# Patient Record
Sex: Female | Born: 1950 | Race: White | Hispanic: No | Marital: Single | Smoking: Current every day smoker
Health system: Southern US, Community
[De-identification: ages and names within clinical notes are randomized; demographics above are authoritative.]

## PROBLEM LIST (undated history)

## (undated) DIAGNOSIS — Z8719 Personal history of other diseases of the digestive system: Secondary | ICD-10-CM

## (undated) DIAGNOSIS — E785 Hyperlipidemia, unspecified: Secondary | ICD-10-CM

## (undated) DIAGNOSIS — M1990 Unspecified osteoarthritis, unspecified site: Secondary | ICD-10-CM

## (undated) DIAGNOSIS — N200 Calculus of kidney: Secondary | ICD-10-CM

## (undated) HISTORY — DX: Personal history of other diseases of the digestive system: Z87.19

## (undated) HISTORY — DX: Unspecified osteoarthritis, unspecified site: M19.90

## (undated) HISTORY — DX: Hyperlipidemia, unspecified: E78.5

## (undated) HISTORY — DX: Calculus of kidney: N20.0

## (undated) HISTORY — PX: BREAST CYST EXCISION: SHX579

## (undated) HISTORY — PX: VAGINAL HYSTERECTOMY: SUR661

---

## 1998-07-18 ENCOUNTER — Other Ambulatory Visit: Admission: RE | Admit: 1998-07-18 | Discharge: 1998-07-18 | Payer: Self-pay | Admitting: Obstetrics and Gynecology

## 1999-04-29 ENCOUNTER — Encounter: Payer: Self-pay | Admitting: Obstetrics and Gynecology

## 1999-04-29 ENCOUNTER — Encounter: Admission: RE | Admit: 1999-04-29 | Discharge: 1999-04-29 | Payer: Self-pay | Admitting: Obstetrics and Gynecology

## 1999-06-04 ENCOUNTER — Other Ambulatory Visit: Admission: RE | Admit: 1999-06-04 | Discharge: 1999-06-04 | Payer: Self-pay | Admitting: Obstetrics and Gynecology

## 1999-06-04 ENCOUNTER — Encounter (INDEPENDENT_AMBULATORY_CARE_PROVIDER_SITE_OTHER): Payer: Self-pay | Admitting: Specialist

## 1999-08-22 ENCOUNTER — Encounter: Payer: Self-pay | Admitting: Obstetrics and Gynecology

## 1999-08-26 ENCOUNTER — Inpatient Hospital Stay (HOSPITAL_COMMUNITY): Admission: RE | Admit: 1999-08-26 | Discharge: 1999-08-28 | Payer: Self-pay | Admitting: Obstetrics and Gynecology

## 2000-08-31 ENCOUNTER — Encounter: Payer: Self-pay | Admitting: Obstetrics and Gynecology

## 2000-08-31 ENCOUNTER — Encounter: Admission: RE | Admit: 2000-08-31 | Discharge: 2000-08-31 | Payer: Self-pay | Admitting: Obstetrics and Gynecology

## 2000-10-08 ENCOUNTER — Other Ambulatory Visit: Admission: RE | Admit: 2000-10-08 | Discharge: 2000-10-08 | Payer: Self-pay | Admitting: Obstetrics and Gynecology

## 2001-10-08 ENCOUNTER — Encounter: Payer: Self-pay | Admitting: Obstetrics and Gynecology

## 2001-10-08 ENCOUNTER — Encounter: Admission: RE | Admit: 2001-10-08 | Discharge: 2001-10-08 | Payer: Self-pay | Admitting: Obstetrics and Gynecology

## 2001-10-15 ENCOUNTER — Other Ambulatory Visit: Admission: RE | Admit: 2001-10-15 | Discharge: 2001-10-15 | Payer: Self-pay | Admitting: Obstetrics and Gynecology

## 2002-03-27 ENCOUNTER — Emergency Department (HOSPITAL_COMMUNITY): Admission: EM | Admit: 2002-03-27 | Discharge: 2002-03-27 | Payer: Self-pay | Admitting: Emergency Medicine

## 2002-10-17 ENCOUNTER — Encounter: Payer: Self-pay | Admitting: Obstetrics and Gynecology

## 2002-10-17 ENCOUNTER — Encounter: Admission: RE | Admit: 2002-10-17 | Discharge: 2002-10-17 | Payer: Self-pay | Admitting: Obstetrics and Gynecology

## 2002-10-31 ENCOUNTER — Other Ambulatory Visit: Admission: RE | Admit: 2002-10-31 | Discharge: 2002-10-31 | Payer: Self-pay | Admitting: Obstetrics and Gynecology

## 2003-09-17 ENCOUNTER — Encounter: Payer: Self-pay | Admitting: Family Medicine

## 2003-09-17 LAB — CONVERTED CEMR LAB

## 2003-10-31 ENCOUNTER — Encounter: Admission: RE | Admit: 2003-10-31 | Discharge: 2003-10-31 | Payer: Self-pay | Admitting: Obstetrics and Gynecology

## 2003-11-10 ENCOUNTER — Other Ambulatory Visit: Admission: RE | Admit: 2003-11-10 | Discharge: 2003-11-10 | Payer: Self-pay | Admitting: Obstetrics and Gynecology

## 2004-07-04 ENCOUNTER — Ambulatory Visit: Payer: Self-pay | Admitting: Family Medicine

## 2004-11-11 ENCOUNTER — Other Ambulatory Visit: Admission: RE | Admit: 2004-11-11 | Discharge: 2004-11-11 | Payer: Self-pay | Admitting: Obstetrics and Gynecology

## 2006-07-18 HISTORY — PX: KNEE SURGERY: SHX244

## 2006-08-11 ENCOUNTER — Encounter: Admission: RE | Admit: 2006-08-11 | Discharge: 2006-08-11 | Payer: Self-pay | Admitting: Family Medicine

## 2006-10-20 ENCOUNTER — Other Ambulatory Visit: Admission: RE | Admit: 2006-10-20 | Discharge: 2006-10-20 | Payer: Self-pay | Admitting: Obstetrics and Gynecology

## 2007-08-09 ENCOUNTER — Ambulatory Visit: Payer: Self-pay | Admitting: Family Medicine

## 2007-08-09 DIAGNOSIS — R109 Unspecified abdominal pain: Secondary | ICD-10-CM | POA: Insufficient documentation

## 2007-08-09 DIAGNOSIS — E782 Mixed hyperlipidemia: Secondary | ICD-10-CM | POA: Insufficient documentation

## 2007-08-09 DIAGNOSIS — E785 Hyperlipidemia, unspecified: Secondary | ICD-10-CM

## 2007-08-10 ENCOUNTER — Encounter: Payer: Self-pay | Admitting: Family Medicine

## 2007-08-13 ENCOUNTER — Telehealth: Payer: Self-pay | Admitting: Family Medicine

## 2007-08-16 ENCOUNTER — Encounter: Payer: Self-pay | Admitting: Family Medicine

## 2007-08-16 DIAGNOSIS — N200 Calculus of kidney: Secondary | ICD-10-CM | POA: Insufficient documentation

## 2007-08-16 DIAGNOSIS — F17200 Nicotine dependence, unspecified, uncomplicated: Secondary | ICD-10-CM

## 2008-06-12 ENCOUNTER — Ambulatory Visit: Payer: Self-pay | Admitting: Family Medicine

## 2008-06-12 DIAGNOSIS — H9209 Otalgia, unspecified ear: Secondary | ICD-10-CM | POA: Insufficient documentation

## 2008-07-26 ENCOUNTER — Encounter: Admission: RE | Admit: 2008-07-26 | Discharge: 2008-07-26 | Payer: Self-pay | Admitting: Obstetrics and Gynecology

## 2008-08-04 ENCOUNTER — Encounter: Payer: Self-pay | Admitting: Obstetrics and Gynecology

## 2008-08-04 ENCOUNTER — Other Ambulatory Visit: Admission: RE | Admit: 2008-08-04 | Discharge: 2008-08-04 | Payer: Self-pay | Admitting: Obstetrics and Gynecology

## 2008-08-04 ENCOUNTER — Ambulatory Visit: Payer: Self-pay | Admitting: Obstetrics and Gynecology

## 2009-10-22 ENCOUNTER — Ambulatory Visit: Payer: Self-pay | Admitting: Family Medicine

## 2009-10-22 DIAGNOSIS — R109 Unspecified abdominal pain: Secondary | ICD-10-CM

## 2009-10-23 ENCOUNTER — Ambulatory Visit: Payer: Self-pay | Admitting: Gastroenterology

## 2009-10-23 ENCOUNTER — Encounter (INDEPENDENT_AMBULATORY_CARE_PROVIDER_SITE_OTHER): Payer: Self-pay | Admitting: *Deleted

## 2009-10-23 LAB — CONVERTED CEMR LAB
Albumin: 3.8 g/dL (ref 3.5–5.2)
BUN: 10 mg/dL (ref 6–23)
Basophils Relative: 1.1 % (ref 0.0–3.0)
Calcium: 9.4 mg/dL (ref 8.4–10.5)
Creatinine, Ser: 0.5 mg/dL (ref 0.4–1.2)
Eosinophils Absolute: 0.1 10*3/uL (ref 0.0–0.7)
Folate: 10.2 ng/mL
GFR calc non Af Amer: 129.25 mL/min (ref >60)
Glucose, Bld: 91 mg/dL (ref 70–99)
HCT: 38 % (ref 36.0–46.0)
Hemoglobin: 13 g/dL (ref 12.0–15.0)
Iron: 41 ug/dL — ABNORMAL LOW (ref 42–145)
Lymphs Abs: 1.6 10*3/uL (ref 0.7–4.0)
MCHC: 34.2 g/dL (ref 30.0–36.0)
MCV: 94 fL (ref 78.0–100.0)
Monocytes Absolute: 0.7 10*3/uL (ref 0.1–1.0)
Neutro Abs: 5.7 10*3/uL (ref 1.4–7.7)
Neutrophils Relative %: 69.3 % (ref 43.0–77.0)
RBC: 4.04 M/uL (ref 3.87–5.11)
TSH: 3.77 microintl units/mL (ref 0.35–5.50)
Total Bilirubin: 0.3 mg/dL (ref 0.3–1.2)
Vitamin B-12: 333 pg/mL (ref 211–911)

## 2009-11-07 ENCOUNTER — Ambulatory Visit: Payer: Self-pay | Admitting: Gastroenterology

## 2009-11-07 DIAGNOSIS — K5732 Diverticulitis of large intestine without perforation or abscess without bleeding: Secondary | ICD-10-CM

## 2009-11-09 ENCOUNTER — Encounter: Payer: Self-pay | Admitting: Gastroenterology

## 2009-11-09 LAB — CONVERTED CEMR LAB: Sed Rate: 22 mm/hr (ref 0–22)

## 2010-02-05 ENCOUNTER — Ambulatory Visit: Payer: Self-pay | Admitting: Gastroenterology

## 2010-02-05 ENCOUNTER — Encounter (INDEPENDENT_AMBULATORY_CARE_PROVIDER_SITE_OTHER): Payer: Self-pay | Admitting: *Deleted

## 2010-03-19 HISTORY — PX: COLONOSCOPY: SHX174

## 2010-03-25 ENCOUNTER — Ambulatory Visit: Payer: Self-pay | Admitting: Gastroenterology

## 2010-03-26 ENCOUNTER — Encounter: Payer: Self-pay | Admitting: Gastroenterology

## 2010-06-16 LAB — CONVERTED CEMR LAB
ALT: 11 units/L (ref 0–35)
Bilirubin, Direct: 0.1 mg/dL (ref 0.0–0.3)
Chloride: 106 meq/L (ref 96–112)
Creatinine, Ser: 0.7 mg/dL (ref 0.4–1.2)
GFR calc non Af Amer: 90.23 mL/min (ref >60)
Total Bilirubin: 0.5 mg/dL (ref 0.3–1.2)

## 2010-06-18 NOTE — Procedures (Signed)
Summary: Colonoscopy  Patient: [name] Note: All result statuses are Final unless otherwise noted.  Tests: (1) Colonoscopy (COL)   COL Colonoscopy           DONE     [facility]     [address]     [place], [place]  [address]           COLONOSCOPY PROCEDURE REPORT           PATIENT:  Natasha, Miller  MR#:  [id number]     BIRTHDATE:  1950/09/16, 59 yrs. old  GENDER:  female     ENDOSCOPIST:  Vania Rea. Jarold Motto, MD, [facility]     REF. BY:  Ruthe Mannan, M.D.     PROCEDURE DATE:  03/25/2010     PROCEDURE:  Colonoscopy with biopsy     ASA CLASS:  Class II     INDICATIONS:  Colorectal cancer screening, average risk, Abdominal     pain, unexplained diarrhea     MEDICATIONS:   Fentanyl 75 mcg IV, Versed 7 mg IV           DESCRIPTION OF PROCEDURE:   After the risks benefits and     alternatives of the procedure were thoroughly explained, informed     consent was obtained.  Digital rectal exam was performed and     revealed no abnormalities.   The LB [id number]-[id number] endoscope     was introduced through the anus and advanced to the cecum, which     was identified by both the appendix and ileocecal valve, limited     by a redundant colon.  VERY FLOPPY SIGMOID COLON NOTED.  The     quality of the prep was excellent, using MoviPrep.  The instrument     was then slowly withdrawn as the colon was fully examined.     <<PROCEDUREIMAGES>>           FINDINGS:  Moderate diverticulosis was found in the sigmoid colon.     LIMITED DISEASE TO SIGMOID AREA.RANDOM COLON BIOPSIES DONE.  No     polyps or cancers were seen.   Retroflexed views in the rectum     revealed no abnormalities.    The scope was then withdrawn from     the patient and the procedure completed.           COMPLICATIONS:  None     ENDOSCOPIC IMPRESSION:     1) Moderate diverticulosis in the sigmoid colon     2) No polyps or cancers     RESOLVED DIVERTICULITIS SEVERAL MONTHS AGO.R/O     MICROSCOPIC/COLLAGENOUS  COLITIS.     RECOMMENDATIONS:     1) Await biopsy results     REPEAT EXAM:  No           ______________________________     Vania Rea. Jarold Motto, MD, [facility]           CC:           n.     eSIGNED:   Vania Rea. Horrace Hanak at 03/25/2010 11:43 AM           Danella Deis, [id number]  Note: An exclamation mark (!) indicates a result that was not dispersed into the flowsheet. Document Creation Date: 03/25/2010 11:43 AM _______________________________________________________________________  (1) Order result status: Final Collection or observation date-time: 03/25/2010 11:36 Requested date-time:  Receipt date-time:  Reported date-time:  Referring Physician:   Ordering Physician: Sheryn Bison [phone]) Specimen Source:  Source: Launa Grill Order Number: [id number] Lab site:   Appended Document: Colonoscopy     Procedures Next Due Date:    Colonoscopy: 03/2020

## 2010-06-18 NOTE — Miscellaneous (Signed)
Summary: Levsin  Clinical Lists Changes  Medications: Added new medication of LEVSIN 0.125 MG TABS (HYOSCYAMINE SULFATE) take one by mouth two times a day as needed - Signed Rx of LEVSIN 0.125 MG TABS (HYOSCYAMINE SULFATE) take one by mouth two times a day as needed;  #60 x 6;  Signed;  Entered by: Harlow Mares CMA (AAMA);  Authorized by: Mardella Layman MD [facility];  Method used: [facility]  [address] [place], [place]  [address], Ph: [phone] or [phone], Fax: [phone]    Prescriptions: LEVSIN 0.125 MG TABS (HYOSCYAMINE SULFATE) take one by mouth two times a day as needed  #60 x 6   Entered by:   Harlow Mares CMA (AAMA)   Authorized by:   Mardella Layman MD [facility]   Signed by:   Harlow Mares CMA (AAMA) on 03/25/2010   Method used:   [facility]        [facility]  [address] (retail)       [address]       [place], [place]  [address]       Ph: [phone] or [phone]       Fax: [phone]   RxID:   [id number]

## 2010-06-18 NOTE — Assessment & Plan Note (Signed)
Summary: FLU LIKE SYMPTOMS/DLO   Vital Signs:  Patient profile:   60 year old female Height:      60 inches Weight:      118.25 pounds BMI:     23.18 Temp:     98.4 degrees F oral Pulse rate:   60 / minute Pulse rhythm:   regular BP sitting:   120 / 80  (left arm) Cuff size:   regular  Vitals Entered By: Linde Gillis CMA Duncan Dull) (October 22, 2009 12:22 PM) CC: flu like symptoms   History of Present Illness: 60 yo here with flu like symptoms and LLQ pain. Has been ongoing for over a week. Has body aches, malaise, no fever.  Also has a constant LLQ abdominal pain, ranging 2/10-10/10. Not aggrevated by food or anything other than pressing directly on her LLQ.  No nausea, vomiting, constipation, diarrhea or blood in her stool. No dysuria, hematuria, increased urinary frequency.  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  Never had anything like this before.  Of note, was moving furniture shortly before this started.  Current Medications (verified): 1)  Black Cohosh Root   Powd (Black Cohosh Root) .... Take 1 Tablet By Mouth Once A Day 2)  Vitamin D 400 Unit  Tabs (Cholecalciferol) .... 2 Tablets By Mouth Daily 3)  Calcium Citrate-Vitamin D 315-200 Mg-Unit  Tabs (Calcium Citrate-Vitamin D) .... Take 1 Tablet By Mouth Once A Day 4)  Glucosamine Sulfate   Powd (Glucosamine Sulfate) .... Take 1 Tablet By Mouth Once A Day  Allergies (verified): No Known Drug Allergies  Review of Systems      See HPI General:  Complains of chills and malaise; denies fever. GI:  Complains of abdominal pain; denies bloody stools, change in bowel habits, diarrhea, nausea, and vomiting. GU:  Denies dysuria, nocturia, and urinary hesitancy.  Physical Exam  General:  Well-developed,well-nourished,in no acute distress; alert,appropriate and cooperative throughout examination Abdomen:  sore over left lower abdomen  soft normal bowel sounds Psych:  Cognition and judgment  appear intact. Alert and cooperative with normal attention span and concentration. No apparent delusions, illusions, hallucinations   Impression & Recommendations:  Problem # 1:  ABDOMINAL PAIN, LOWER (ICD-789.09) Assessment New UA neg. Wide differential- ?possible renal calculus or diverticulosis/diverticulitis although not classic presentation of either. Will order CT of abdomen and pelvis.  Has a CT scanner at work, given written order on prescription pad instead of placing order through EMR. Orders: UA Dipstick w/o Micro (manual) ([address]) Venipuncture ([address]) TLB-BMP (Basic Metabolic Panel-BMET) (80048-METABOL) TLB-Hepatic/Liver Function Pnl (80076-HEPATIC)  Complete Medication List: 1)  Black Cohosh Root Powd (Black cohosh root) .... Take 1 tablet by mouth once a day 2)  Vitamin D 400 Unit Tabs (Cholecalciferol) .... 2 tablets by mouth daily 3)  Calcium Citrate-vitamin D 315-200 Mg-unit Tabs (Calcium citrate-vitamin d) .... Take 1 tablet by mouth once a day 4)  Glucosamine Sulfate Powd (Glucosamine sulfate) .... Take 1 tablet by mouth once a day  Current Allergies (reviewed today): No known allergies   Appended Document: FLU LIKE SYMPTOMS/DLO     Allergies: No Known Drug Allergies   Complete Medication List: 1)  Black Cohosh Root Powd (Black cohosh root) .... Take 1 tablet by mouth once a day 2)  Vitamin D 400 Unit Tabs (Cholecalciferol) .... 2 tablets by mouth daily 3)  Calcium Citrate-vitamin D 315-200 Mg-unit Tabs (Calcium citrate-vitamin d) .... Take 1 tablet by mouth once a day  4)  Glucosamine Sulfate Powd (Glucosamine sulfate) .... Take 1 tablet by mouth once a day   Laboratory Results   Urine Tests    Routine Urinalysis   Color: yellow Appearance: Clear Glucose: negative   (Normal Range: Negative) Bilirubin: negative   (Normal Range: Negative) Ketone: negative   (Normal Range: Negative) Spec. Gravity: 1.015   (Normal Range: 1.003-1.035) Blood:  negative   (Normal Range: Negative) pH: 5.0   (Normal Range: 5.0-8.0) Protein: negative   (Normal Range: Negative) Urobilinogen: 0.2   (Normal Range: 0-1) Nitrite: negative   (Normal Range: Negative) Leukocyte Esterace: negative   (Normal Range: Negative)

## 2010-06-18 NOTE — Letter (Signed)
Summary: Patient Notice- Colon Biospy Results   Gastroenterology  [address] [place], [place] [address]   Phone: [phone]  Fax: [phone]        March 26, 2010 MRN: [id number]    [name] [address] [place], [place]  [address]    xDear Ms. SARRACINO,  I am pleased to inform you that the biopsies taken during your recent colonoscopy did not show any evidence of cancer upon pathologic examination.  Additional information/recommendations:  __No further action is needed at this time.  Please follow-up with      your primary care physician for your other healthcare needs.  __Please call [phone] to schedule a return visit to review      your condition.  _x_Continue with the treatment plan as outlined on the day of your      exam.Random colon biopsies were entirely normal without evidence of microscopic or collagenous colitis.  __You should have a repeat colonoscopy examination for this problem           in _ years.  Please call us if you are having persistent problems or have questions about your condition that have not been fully answered at this time.  Sincerely,  Mardella Layman MD [facility]   This letter has been electronically signed by your physician.  Appended Document: Patient Notice- Colon Biospy Results letter mailed

## 2010-06-18 NOTE — Assessment & Plan Note (Signed)
Summary: 66-MONTH F/U APPT...LSW.   History of Present Illness Visit Type: Follow-up Visit Primary GI MD: Sheryn Bison MD FACP FAGA Primary Quanda Pavlicek: Ruthe Mannan, MD Requesting Kesler Wickham: na Chief Complaint: Patient feeling better overall, except she has had several episodes of left sided pain and diarrhea. History of Present Illness:   Occasional left lower quadrant spasms and occasional loose stool but otherwise she is doing well and denies general medical or gastrointestinal problems. She is on a high fiber diet but avoids seeds. She has not had previous colonoscopy.   GI Review of Systems    Reports abdominal pain.     Location of  Abdominal pain: left side.    Denies acid reflux, belching, bloating, chest pain, dysphagia with liquids, dysphagia with solids, heartburn, loss of appetite, nausea, vomiting, vomiting blood, weight loss, and  weight gain.      Reports diarrhea.     Denies anal fissure, black tarry stools, change in bowel habit, constipation, diverticulosis, fecal incontinence, heme positive stool, hemorrhoids, irritable bowel syndrome, jaundice, light color stool, liver problems, rectal bleeding, and  rectal pain.    Current Medications (verified): 1)  Black Cohosh Root   Powd (Black Cohosh Root) .... Take 1 Tablet By Mouth Once A Day 2)  Vitamin D 400 Unit  Tabs (Cholecalciferol) .... 2 Tablets By Mouth Daily 3)  Calcium Citrate-Vitamin D 315-200 Mg-Unit  Tabs (Calcium Citrate-Vitamin D) .... Take 1 Tablet By Mouth Once A Day 4)  Glucosamine Sulfate   Powd (Glucosamine Sulfate) .... Take 1 Tablet By Mouth Once A Day 5)  Tramadol Hcl 50 Mg Tabs (Tramadol Hcl) .[name].. 1 By Mouth  As Needed For Pain  Allergies (verified): No Known Drug Allergies  Past History:  Past medical, surgical, family and social histories (including risk factors) reviewed for relevance to current acute and chronic problems.  Past Medical History: Reviewed history from 10/23/2009 and no changes  required. Hyperlipidemia Kidney Stones  Past Surgical History: Reviewed history from 11/07/2009 and no changes required. hysterectomy, total  right knee surgery 3/ 2008 Right breast cyst x 2 removed Dexa- normal per patient  Family History: Reviewed history from 11/07/2009 and no changes required. father: MI late 58s early 110s Father: CABG age 77, HTN, died age 41- MI Mother: thyroid problems, atrial fibrillation Siblings: 1 brother, 3 sisters- all ok No FH of Colon Cancer:  Social History: Reviewed history from 11/07/2009 and no changes required. smoke 1.5 packs per week, smoke 30 years Marital Status single Children: 1 daughter Occupation: medical receptionist Alcohol Use - rare  Daily Caffeine Use: 3 daily  Patient gets regular exercise.  Review of Systems  The patient denies allergy/sinus, anemia, anxiety-new, arthritis/joint pain, back pain, blood in urine, breast changes/lumps, change in vision, confusion, cough, coughing up blood, depression-new, fainting, fatigue, fever, headaches-new, hearing problems, heart murmur, heart rhythm changes, itching, menstrual pain, muscle pains/cramps, night sweats, nosebleeds, pregnancy symptoms, shortness of breath, skin rash, sleeping problems, sore throat, swelling of feet/legs, swollen lymph glands, thirst - excessive , urination - excessive , urination changes/pain, urine leakage, vision changes, and voice change.    Vital Signs:  Patient profile:   60 year old female Height:      60 inches Weight:      117.2 pounds Pulse rate:   72 / minute Pulse rhythm:   regular BP sitting:   108 / 68  (left arm) Cuff size:   regular  Vitals Entered By: Harlow Mares CMA Duncan Dull) (February 05, 2010 8:36  AM)  Physical Exam  General:  Well developed, well nourished, no acute distress.healthy appearing.   Head:  Normocephalic and atraumatic. Eyes:  PERRLA, no icterus.exam deferred to patient's ophthalmologist.   Abdomen:  Soft, nontender  and nondistended. No masses, hepatosplenomegaly or hernias noted. Normal bowel sounds. Psych:  Alert and cooperative. Normal mood and affect.   Impression & Recommendations:  Problem # 1:  DIVERTICULITIS OF COLON (ICD-562.11) Assessment Improved  Continue high-fiber diet and fiber supplements as needed.  Orders: Colonoscopy (Colon)  Problem # 2:  SCREENING COLORECTAL-CANCER (ICD-V76.51) Assessment: Unchanged  Colonoscopy in 4-6 weeks' time for colon cancer screening.  Orders: Colonoscopy (Colon)  Patient Instructions: 1)  Copy sent to : Dr. Enos Fling 2)  Your prescription has been sent to you pharmacy.  3)  Your procedure has been scheduled for 03/25/2010, please follow the seperate instructions.  4)  Please continue current medications.  5)  Constipation and Hemorrhoids brochure given.  6)  Colonoscopy and Flexible Sigmoidoscopy brochure given.  7)  Conscious Sedation brochure given.  8)  Diet should be high in fiber ( fruits, vegetables, whole grains) but low in residue. Drink at least eight (8) glasses of water a day.  9)  [facility] Patient Information Guide given to patient.  10)  The medication list was reviewed and reconciled.  All changed / newly prescribed medications were explained.  A complete medication list was provided to the patient / caregiver. Prescriptions: MOVIPREP 100 GM  SOLR (PEG-KCL-NACL-NASULF-NA ASC-C) As per prep instructions.  #1 x 0   Entered by:   Harlow Mares CMA (AAMA)   Authorized by:   Mardella Layman MD [facility]   Signed by:   Harlow Mares CMA (AAMA) on 02/05/2010   Method used:   [facility]        [facility]  [address] [facility]       [address]       [place], [place]  [address]       Ph: [phone] or [phone]       Fax: [phone]   RxID:   [id number]

## 2010-06-18 NOTE — Assessment & Plan Note (Signed)
Summary: Diverticulitis/dfs   History of Present Illness Visit Type: new patient  Primary GI MD: Sheryn Bison MD FACP FAGA Primary Provider: Ruthe Mannan, MD  Requesting Provider: na Chief Complaint: Diarrhea, LLQ abd pain  History of Present Illness:   60 year old Caucasian female who has had one week of myalgias, arthralgias, and 2 days of left lower quadrant discomfort. CT Scan yesterday showed diverticulitis and possible localized perforation in the descending colon. She been placed on metronidazole and Cipro within the last 24 hours. She has mild abdominal pain but no nausea vomiting fever chills or genitourinary complaints. She has not had previous barium studies or colonoscopy. She has chronic loose stools but denies melena or hematochezia. She's had no upper gastrointestinal or hepatobiliary complaints.  Family history is noncontributory. She does have a history recurrent nephrolithiasis. She follows a fairly regular diet and does not smoke or abuse ethanol. She does use frequent NSAIDs. She been a long-term smoker, and works as a Corporate treasurer. She gives no history of chronic liver disease, previous hepatitis, pancreatitis, or chronic GI illnesses.   GI Review of Systems    Reports abdominal pain and  belching.     Location of  Abdominal pain: LLQ.    Denies acid reflux, bloating, chest pain, dysphagia with liquids, dysphagia with solids, heartburn, loss of appetite, nausea, vomiting, vomiting blood, weight loss, and  weight gain.      Reports diarrhea and  hemorrhoids.     Denies anal fissure, black tarry stools, change in bowel habit, constipation, diverticulosis, fecal incontinence, heme positive stool, irritable bowel syndrome, jaundice, light color stool, liver problems, rectal bleeding, and  rectal pain.    Current Medications (verified): 1)  Black Cohosh Root   Powd (Black Cohosh Root) .... Take 1 Tablet By Mouth Once A Day 2)  Vitamin D 400 Unit  Tabs (Cholecalciferol)  .... 2 Tablets By Mouth Daily 3)  Calcium Citrate-Vitamin D 315-200 Mg-Unit  Tabs (Calcium Citrate-Vitamin D) .... Take 1 Tablet By Mouth Once A Day 4)  Glucosamine Sulfate   Powd (Glucosamine Sulfate) .... Take 1 Tablet By Mouth Once A Day  Allergies (verified): No Known Drug Allergies  Past History:  Past medical, surgical, family and social histories (including risk factors) reviewed for relevance to current acute and chronic problems.  Past Medical History: Hyperlipidemia Kidney Stones  Past Surgical History: Reviewed history from 08/16/2007 and no changes required. hysterectomy, total  knee surgery 3/ 2008 Right breast cyst x 2 removed Dexa- normal per patient  Family History: Reviewed history from 08/16/2007 and no changes required. father: MI late 32s early 28s Father: CABG age 27, HTN, died age 36- MI Mother: thyroid problems Siblings: 1 brother, 3 sisters- all ok No FH of Colon Cancer:  Social History: Reviewed history from 08/16/2007 and no changes required. smoke 1.5 packs per week, smoke 30 years Marital Status single Children: 1 daughter Occupation: medical receptionist Alcohol Use - no Daily Caffeine Use: 3 daily   Review of Systems General:  Complains of malaise; denies fever, chills, sweats, anorexia, fatigue, weakness, weight loss, and sleep disorder. CV:  Denies chest pains, angina, palpitations, syncope, dyspnea on exertion, orthopnea, PND, peripheral edema, and claudication. Resp:  Denies dyspnea at rest, dyspnea with exercise, cough, sputum, wheezing, coughing up blood, and pleurisy. GI:  Complains of abdominal pain; denies difficulty swallowing, pain on swallowing, nausea, indigestion/heartburn, vomiting, vomiting blood, jaundice, gas/bloating, diarrhea, constipation, change in bowel habits, bloody BM's, black BMs, and fecal incontinence. GU:  Denies  urinary burning, blood in urine, nocturnal urination, urinary frequency, urinary incontinence,  abnormal vaginal bleeding, amenorrhea, menorrhagia, vaginal discharge, pelvic pain, genital sores, painful intercourse, and decreased libido. MS:  Complains of muscle cramps; denies joint pain / LOM, joint swelling, joint stiffness, joint deformity, low back pain, muscle weakness, muscle atrophy, leg pain at night, leg pain with exertion, and shoulder pain / LOM hand / wrist pain (CTS). Neuro:  Denies weakness, paralysis, abnormal sensation, seizures, syncope, tremors, vertigo, transient blindness, frequent falls, frequent headaches, difficulty walking, headache, sciatica, radiculopathy other:, restless legs, memory loss, and confusion. Psych:  Denies depression, anxiety, memory loss, suicidal ideation, hallucinations, paranoia, phobia, and confusion. Endo:  Denies cold intolerance, heat intolerance, polydipsia, polyphagia, polyuria, unusual weight change, and hirsutism. Heme:  Denies bruising, bleeding, enlarged lymph nodes, and pagophagia.  Vital Signs:  Patient profile:   60 year old female Height:      60 inches Weight:      118 pounds BMI:     23.13 BSA:     1.49 Temp:     98.2 degrees F oral Pulse rate:   72 / minute Pulse rhythm:   regular BP sitting:   118 / 68  (left arm) Cuff size:   regular  Vitals Entered By: Ok Anis CMA (October 23, 2009 10:52 AM)  Physical Exam  General:  Well developed, well nourished, no acute distress.Mild dehydration with decreased turgor and some dry mucous membranes noted. Vital signs are stable. Head:  Normocephalic and atraumatic. Eyes:  PERRLA, no icterus.exam deferred to patient's ophthalmologist.  exam deferred to patient's ophthalmologist.   Neck:  Supple; no masses or thyromegaly. Lungs:  Clear throughout to auscultation. Heart:  Regular rate and rhythm; no murmurs, rubs,  or bruits. Abdomen:  Soft, nontender and nondistended. No masses, hepatosplenomegaly or hernias noted. Normal bowel sounds. Mild direct tenderness in the right lower  quadrant without rebound. Bowel sounds are hypoactive but present. Rectal:  deferred. Extremities:  No clubbing, cyanosis, edema or deformities noted. Neurologic:  Alert and  oriented x4;  grossly normal neurologically. Psych:  Alert and cooperative. Normal mood and affect.tearful.   Impression & Recommendations:  Problem # 1:  ABDOMINAL PAIN, LOWER (ICD-789.09) Assessment Unchanged Symptoms and clinical course consistent with mild diverticulitis and possible small contained pericolonic abscess. She does not appear to toxic or extremely ill I do not think she needs hospitalization at this point. However, this may be needed if there is any worsening of her status. We will continue twice a day metronidazole and Cipro with p.r.n. tramadol 50 mg every 6-8 hours as needed, low fiber diet, and followup in 10 days. Labs have been ordered additionally. She saw our patient education movie on diverticulitis-diverticulosis. If she has any worsening of her condition she is to go to the emergency room and to call us stat.  Problem # 2:  Hx of RENAL CALCULUS (ICD-592.0) Assessment: Comment Only  Problem # 3:  Hx of TOBACCO ABUSE (ICD-305.1) Assessment: Comment Only  Other Orders: TLB-CBC Platelet - w/Differential (85025-CBCD) TLB-BMP (Basic Metabolic Panel-BMET) (80048-METABOL) TLB-Hepatic/Liver Function Pnl (80076-HEPATIC) TLB-TSH (Thyroid Stimulating Hormone) (84443-TSH) TLB-B12, Serum-Total ONLY ([address]-U04) TLB-Ferritin (82728-FER) TLB-Folic Acid (Folate) (82746-FOL) TLB-IBC Pnl (Iron/FE;Transferrin) (83550-IBC) TLB-Sedimentation Rate (ESR) (85652-ESR)  Patient Instructions: 1)  Please go to the basement for lab work. 2)  A prescription for Rolly Pancake will be sent to your pharmacy to use as needed for pain. 3)  Low Fiber/Residue Diet handout given.  4)  Continue Cipro and Flagyl. 5)  You will need stay out of work for the remainder of the week. 6)  Please schedule a follow-up appointment in 2  weeks.  7)  The medication list was reviewed and reconciled.  All changed / newly prescribed medications were explained.  A complete medication list was provided to the patient / caregiver. 8)  Copy sent to : Dr. Larey Dresser and Dr.Talia Dayton Martes in primary care 9)  Please continue current medications.  10)  Advised to stick with a low residue diet  avoiding food that can irritate bowel (see handout).  11)  Diverticular Disease brochure given.   Appended Document: Diverticulitis/dfs    Clinical Lists Changes  Medications: Added new medication of TRAMADOL HCL 50 MG TABS (TRAMADOL HCL) 1 by mouth q 6 hrs as needed for pain - Signed Rx of TRAMADOL HCL 50 MG TABS (TRAMADOL HCL) 1 by mouth q 6 hrs as needed for pain;  #50 x 1;  Signed;  Entered by: Ashok Cordia RN;  Authorized by: Mardella Layman MD [facility];  Method used: Electronically to [facility]  [place], [place], [place]  [address], Ph: [phone] or [phone], Fax: [phone]    Prescriptions: TRAMADOL HCL 50 MG TABS (TRAMADOL HCL) 1 by mouth q 6 hrs as needed for pain  #50 x 1   Entered by:   Ashok Cordia RN   Authorized by:   Mardella Layman MD [facility]   Signed by:   Ashok Cordia RN on 10/23/2009   Method used:   [facility]ronically to        [facility]  [place] (retail)       [address]       [place], [place]  [address]       Ph: [phone] or [phone]       Fax: [phone]   RxID:   [id number]

## 2010-06-18 NOTE — Letter (Signed)
Summary: Out of Work  [facility]  [address] [place], [place] [address]   Phone: [phone]  Fax: [phone]    October 23, 2009   Employee:  KISSY CIELO    To Whom It May Concern:   For Medical reasons, please excuse the above named employee from work for the following dates:  Start:   10/23/09  End:   10/28/09  If you need additional information, please feel free to contact our office.         Sincerely,    Ashok Cordia RN

## 2010-06-18 NOTE — Letter (Signed)
Summary: [facility] Instructions  [facility]  [address] [place], [place] [address]   Phone: [phone]  Fax: [phone]       EDLIN FORD    1952-11-58    MRN: [id number]        Procedure Day /Date: Monday 03/25/2010     Arrival Time: 9:30am     Procedure Time: 10:30am     Location of Procedure:                    X  [facility] (4th Floor)   PREPARATION FOR COLONOSCOPY WITH MOVIPREP   Starting 5 days prior to your procedure 03/20/2010 do not eat nuts, seeds, popcorn, corn, beans, peas,  salads, or any raw vegetables.  Do not take any fiber supplements (e.g. Metamucil, Citrucel, and Benefiber).  THE DAY BEFORE YOUR PROCEDURE         DATE: 03/24/2010  DAY: Sunday  1.  Drink clear liquids the entire day-NO SOLID FOOD  2.  Do not drink anything colored red or purple.  Avoid juices with pulp.  No orange juice.  3.  Drink at least 64 oz. (8 glasses) of fluid/clear liquids during the day to prevent dehydration and help the prep work efficiently.  CLEAR LIQUIDS INCLUDE: Water Jello Ice Popsicles Tea (sugar ok, no milk/cream) Powdered fruit flavored drinks Coffee (sugar ok, no milk/cream) Gatorade Juice: apple, white grape, white cranberry  Lemonade Clear bullion, consomm, broth Carbonated beverages (any kind) Strained chicken noodle soup Hard Candy                             4.  In the morning, mix first dose of MoviPrep solution:    Empty 1 Pouch A and 1 Pouch B into the disposable container    Add lukewarm drinking water to the top line of the container. Mix to dissolve    Refrigerate (mixed solution should be used within 24 hrs)  5.  Begin drinking the prep at 5:00 p.m. The MoviPrep container is divided by 4 marks.   Every 15 minutes drink the solution down to the next mark (approximately 8 oz) until the full liter is complete.   6.  Follow completed prep with 16 oz of clear liquid of your choice (Nothing red or purple).   Continue to drink clear liquids until bedtime.  7.  Before going to bed, mix second dose of MoviPrep solution:    Empty 1 Pouch A and 1 Pouch B into the disposable container    Add lukewarm drinking water to the top line of the container. Mix to dissolve    Refrigerate  THE DAY OF YOUR PROCEDURE      DATE: 03/25/2010 DAY: Monday Beginning at 5:30am (5 hours before procedure):         1. Every 15 minutes, drink the solution down to the next mark (approx 8 oz) until the full liter is complete.  2. Follow completed prep with 16 oz. of clear liquid of your choice.    3. You may drink clear liquids until 8:30am (2 HOURS BEFORE PROCEDURE).   MEDICATION INSTRUCTIONS  Unless otherwise instructed, you should take regular prescription medications with a small sip of water   as early as possible the morning of your procedure.          OTHER INSTRUCTIONS  You will need a responsible adult at least 58 years of age to  accompany you and drive you home.   This person must remain in the waiting room during your procedure.  Wear loose fitting clothing that is easily removed.  Leave jewelry and other valuables at home.  However, you may wish to bring a book to read or  an iPod/MP3 player to listen to music as you wait for your procedure to start.  Remove all body piercing jewelry and leave at home.  Total time from sign-in until discharge is approximately 2-3 hours.  You should go home directly after your procedure and rest.  You can resume normal activities the  day after your procedure.  The day of your procedure you should not:   Drive   Make legal decisions   Operate machinery   Drink alcohol   Return to work  You will receive specific instructions about eating, activities and medications before you leave.    The above instructions have been reviewed and explained to me by   _______________________    I fully understand and can verbalize these instructions  _____________________________ Date _________

## 2010-06-18 NOTE — Letter (Signed)
Summary: [facility]  [facility]   Imported By: Lanelle Bal 10/29/2009 09:32:13  _____________________________________________________________________  External Attachment:    Type:   Image     Comment:   External Document

## 2010-06-18 NOTE — Assessment & Plan Note (Signed)
Summary: 10 DAY F/U   History of Present Illness Visit Type: Follow-up Visit Primary GI MD: Sheryn Bison MD FACP FAGA Primary Provider: Enos Fling, MD Requesting Provider: na Chief Complaint: Patient here for 10 day f/u diverticulitis. She states that her diarrhea has gotten better although she still has 3-4 loose bm's daily. She has very occasional LLQ abdominal discomfort and some nausea but no vomiting. History of Present Illness:   [name] is much improved clinically with nodal or abdominal pain or systemic complaints. She has had watery stools for the last several days with gas and bloating, but no rectal bleeding, fever or chills. She completed 2 weeks of Cipro and metronidazole for her diverticulitis. She is on low roughage diet and otherwise is doing well. Lab data reviewed is otherwise unremarkable except for sedimentation rate of 49.   GI Review of Systems    Reports abdominal pain, belching, loss of appetite, and  nausea.     Location of  Abdominal pain: LLQ.    Denies acid reflux, bloating, chest pain, dysphagia with liquids, dysphagia with solids, heartburn, vomiting, vomiting blood, weight loss, and  weight gain.      Reports change in bowel habits, diarrhea, diverticulosis, hemorrhoids, and  rectal bleeding.     Denies rectal pain. Preventive Screening-Counseling & Management  Caffeine-Diet-Exercise     Does Patient Exercise: yes    Current Medications (verified): 1)  Black Cohosh Root   Powd (Black Cohosh Root) .... Take 1 Tablet By Mouth Once A Day 2)  Vitamin D 400 Unit  Tabs (Cholecalciferol) .... 2 Tablets By Mouth Daily 3)  Calcium Citrate-Vitamin D 315-200 Mg-Unit  Tabs (Calcium Citrate-Vitamin D) .... Take 1 Tablet By Mouth Once A Day 4)  Glucosamine Sulfate   Powd (Glucosamine Sulfate) .... Take 1 Tablet By Mouth Once A Day 5)  Tramadol Hcl 50 Mg Tabs (Tramadol Hcl) .[name].. 1 By Mouth Q 6 Hrs As Needed For Pain  Allergies (verified): No Known Drug  Allergies  Past History:  Past medical, surgical, family and social histories (including risk factors) reviewed for relevance to current acute and chronic problems.  Past Medical History: Reviewed history from 10/23/2009 and no changes required. Hyperlipidemia Kidney Stones  Past Surgical History: hysterectomy, total  right knee surgery 3/ 2008 Right breast cyst x 2 removed Dexa- normal per patient  Family History: Reviewed history from 10/23/2009 and no changes required. father: MI late 78s early 68s Father: CABG age 82, HTN, died age 27- MI Mother: thyroid problems, atrial fibrillation Siblings: 1 brother, 3 sisters- all ok No FH of Colon Cancer:  Social History: Reviewed history from 10/23/2009 and no changes required. smoke 1.5 packs per week, smoke 30 years Marital Status single Children: 1 daughter Occupation: medical receptionist Alcohol Use - rare  Daily Caffeine Use: 3 daily  Patient gets regular exercise. Does Patient Exercise:  yes  Review of Systems  The patient denies allergy/sinus, anemia, anxiety-new, arthritis/joint pain, back pain, blood in urine, breast changes/lumps, change in vision, confusion, cough, coughing up blood, depression-new, fainting, fatigue, fever, headaches-new, hearing problems, heart murmur, heart rhythm changes, itching, menstrual pain, muscle pains/cramps, night sweats, nosebleeds, pregnancy symptoms, shortness of breath, skin rash, sleeping problems, sore throat, swelling of feet/legs, swollen lymph glands, thirst - excessive , urination - excessive , urination changes/pain, urine leakage, vision changes, and voice change.    Vital Signs:  Patient profile:   60 year old female Height:      60 inches Weight:  114.38 pounds BMI:     22.42 BSA:     1.47 Pulse rate:   84 / minute Pulse rhythm:   regular BP sitting:   92 / 56  (left arm)  Vitals Entered By: Lamona Curl CMA [facility]) (November 07, 2009 3:38 PM)  Physical  Exam  General:  Well developed, well nourished, no acute distress.healthy appearing.   Head:  Normocephalic and atraumatic. Eyes:  PERRLA, no icterus.exam deferred to patient's ophthalmologist.   Abdomen:  Soft, nontender and nondistended. No masses, hepatosplenomegaly or hernias noted. Normal bowel sounds. Rectal:  Normal exam.Soft normal color nonbloody stool which is guaiac-negative. Extremities:  No clubbing, cyanosis, edema or deformities noted. Neurologic:  Alert and  oriented x4;  grossly normal neurologically. Psych:  Alert and cooperative. Normal mood and affect.   Impression & Recommendations:  Problem # 1:  DIVERTICULITIS OF COLON (ICD-562.11) Assessment Improved She is to slowly advance the fiber in her diet as tolerated. Her diarrhea seemed to be related to her antibiotics, and I have started Florstar one tablet twice a day, will repeat sed rate and check stool for C. difficile toxin. She will need future colonoscopy once her diverticulitis has further recovered. I will tentatively schedule her to see me again in 3 months time or p.r.n. as needed. Orders: TLB-Sedimentation Rate (ESR) (85652-ESR) T-Culture, C-Diff Toxin A/B ([id number])  Problem # 2:  ABDOMINAL PAIN, LOWER (ICD-789.09) Assessment: Improved  Patient Instructions: 1)  Please go to the basement for lab work. 2)  Begin Florastor two times a day.  Buy this OTC. 3)  Please schedule a follow-up appointment in 3 months. 4)  The medication list was reviewed and reconciled.  All changed / newly prescribed medications were explained.  A complete medication list was provided to the patient / caregiver. 5)  Copy sent to : Dr. Enos Fling 6)  Diverticular Disease brochure given.   Appended Document: 10 DAY F/U    Clinical Lists Changes  Medications: Added new medication of FLORASTOR 250 MG CAPS (SACCHAROMYCES BOULARDII) bid - Signed

## 2010-09-05 ENCOUNTER — Encounter: Payer: Self-pay | Admitting: Family Medicine

## 2010-09-05 ENCOUNTER — Ambulatory Visit (INDEPENDENT_AMBULATORY_CARE_PROVIDER_SITE_OTHER): Payer: PRIVATE HEALTH INSURANCE | Admitting: Family Medicine

## 2010-09-05 DIAGNOSIS — B029 Zoster without complications: Secondary | ICD-10-CM

## 2010-09-05 LAB — HM COLONOSCOPY

## 2010-09-05 MED ORDER — VALACYCLOVIR HCL 1 G PO TABS: ORAL_TABLET | ORAL | Status: AC

## 2010-09-05 NOTE — Progress Notes (Signed)
Subjective:     [name] is a 60 y.o. female who presents for evaluation of a rash involving the chest. Rash started 2 days ago. Lesions are pink, and raised in texture. Rash has changed over time. Rash is painful and is pruritic. Associated symptoms: none. Patient denies: abdominal pain, arthralgia, irritability and myalgia. Patient has not had contacts with similar rash. Patient has not had new exposures (soaps, lotions, laundry detergents, foods, medications, plants, insects or animals).  The following portions of the patient's history were reviewed and updated as appropriate: problem list.  Review of Systems Pertinent items are noted in HPI.    Objective:    There were no vitals taken for this visit. General:  alert, cooperative and no distress  Skin:  papules noted on left chest     Assessment:    Shingles    Plan:    Medications: Valtrex 1 gram three times daily x 7 days.

## 2011-02-07 ENCOUNTER — Telehealth: Payer: Self-pay | Admitting: Family Medicine

## 2011-02-07 NOTE — Telephone Encounter (Signed)
Patient would like to schedule physical with either Dr. Dayton Martes or Dr. Milinda Antis between Oct 12 and 22nd.  Dr. Ermalene Searing is listed as PCP; however, pt states she has never seen Dr. Ermalene Searing.  Please call patient back at [phone] to schedule with one of the two doctors and change her PCP accordingly.  Thanks

## 2011-02-27 ENCOUNTER — Other Ambulatory Visit: Payer: Self-pay | Admitting: Family Medicine

## 2011-02-27 DIAGNOSIS — E785 Hyperlipidemia, unspecified: Secondary | ICD-10-CM

## 2011-02-27 DIAGNOSIS — Z0000 Encounter for general adult medical examination without abnormal findings: Secondary | ICD-10-CM

## 2011-03-05 ENCOUNTER — Other Ambulatory Visit (INDEPENDENT_AMBULATORY_CARE_PROVIDER_SITE_OTHER): Payer: PRIVATE HEALTH INSURANCE

## 2011-03-05 DIAGNOSIS — Z0000 Encounter for general adult medical examination without abnormal findings: Secondary | ICD-10-CM

## 2011-03-05 DIAGNOSIS — E785 Hyperlipidemia, unspecified: Secondary | ICD-10-CM

## 2011-03-05 LAB — BASIC METABOLIC PANEL
CO2: 26 mEq/L (ref 19–32)
Calcium: 9.5 mg/dL (ref 8.4–10.5)
GFR: 151.99 mL/min (ref >60.00)
Glucose, Bld: 93 mg/dL (ref 70–99)
Potassium: 3.9 mEq/L (ref 3.5–5.1)
Sodium: 141 mEq/L (ref 135–145)

## 2011-03-05 LAB — HEPATIC FUNCTION PANEL
ALT: 15 U/L (ref 0–35)
Bilirubin, Direct: 0.1 mg/dL (ref 0.0–0.3)
Total Protein: 7.4 g/dL (ref 6.0–8.3)

## 2011-03-05 LAB — LIPID PANEL
HDL: 75.3 mg/dL (ref >39.00)
Triglycerides: 85 mg/dL (ref 0.0–149.0)
VLDL: 17 mg/dL (ref 0.0–40.0)

## 2011-03-05 LAB — LDL CHOLESTEROL, DIRECT: Direct LDL: 166.3 mg/dL

## 2011-03-10 ENCOUNTER — Encounter: Payer: Self-pay | Admitting: Family Medicine

## 2011-03-10 ENCOUNTER — Ambulatory Visit (INDEPENDENT_AMBULATORY_CARE_PROVIDER_SITE_OTHER): Payer: PRIVATE HEALTH INSURANCE | Admitting: Family Medicine

## 2011-03-10 VITALS — BP 130/72 | HR 70 | Temp 98.4°F | Ht 59.0 in | Wt 113.5 lb

## 2011-03-10 DIAGNOSIS — Z23 Encounter for immunization: Secondary | ICD-10-CM

## 2011-03-10 DIAGNOSIS — K644 Residual hemorrhoidal skin tags: Secondary | ICD-10-CM | POA: Insufficient documentation

## 2011-03-10 DIAGNOSIS — Z0000 Encounter for general adult medical examination without abnormal findings: Secondary | ICD-10-CM | POA: Insufficient documentation

## 2011-03-10 MED ORDER — HYDROCORTISONE ACETATE 25 MG RE SUPP: 25.0000 mg | Freq: Two times a day (BID) | RECTAL | Status: DC

## 2011-03-10 NOTE — Patient Instructions (Signed)

## 2011-03-10 NOTE — Progress Notes (Signed)
  Subjective:    Patient ID: [name], female    DOB: 05/13/1951, 60 y.o.   MRN: [id number]  HPI   60 yo here for CPX.  S/p complete hysterectomy/oophrectomy.  Has GYN whom she follows yearly, for pelvic exams and mammograms. Overdue for mammogram- she will call  OBGYN and schedule.  Hemorrhoids- external, have been bothering her for years (since childbirth). Mainly itch and bleed, not painful. Has been taking Colace but has not been constipated.  HLD- Lab Results  Component Value Date   CHOL 257* 03/05/2011   Lab Results  Component Value Date   HDL 75.30 03/05/2011   No results found for this basename: [facility]   Lab Results  Component Value Date   TRIG 85.0 03/05/2011   Lab Results  Component Value Date   CHOLHDL 3 03/05/2011   Lab Results  Component Value Date   LDLDIRECT 166.3 03/05/2011   Per pt, LDL always elevated.  Walking more and quit smoking 6 months ago!!!  Review of Systems See HPI Patient reports no  vision/ hearing changes,anorexia, weight change, fever ,adenopathy, persistant / recurrent hoarseness, swallowing issues, chest pain, edema,persistant / recurrent cough, hemoptysis, dyspnea(rest, exertional, paroxysmal nocturnal),abdominal pain, excessive heart burn, GU symptoms(dysuria, hematuria, pyuria, voiding/incontinence  Issues) syncope, focal weakness, severe memory loss, concerning skin lesions, depression, anxiety, abnormal bruising/bleeding, major joint swelling, breast masses or abnormal vaginal bleeding.       Objective:   Physical Exam BP 130/72  Pulse 70  Temp(Src) 98.4 F (36.9 C) (Oral)  Ht 4\' 11"  (1.499 m)  Wt 113 lb 8 oz (51.483 kg)  BMI 22.92 kg/m2  General:  Well-developed,well-nourished,in no acute distress; alert,appropriate and cooperative throughout examination Head:  normocephalic and atraumatic.   Eyes:  vision grossly intact, pupils equal, pupils round, and pupils reactive to light.   Ears:  R ear normal and L ear  normal.   Nose:  no external deformity.   Mouth:  good dentition.   Neck:  No deformities, masses, or tenderness noted. Breasts:  Deferred- has GYN Lungs:  Normal respiratory effort, chest expands symmetrically. Lungs are clear to auscultation, no crackles or wheezes. Heart:  Normal rate and regular rhythm. S1 and S2 normal without gallop, murmur, click, rub or other extra sounds. Abdomen:  Bowel sounds positive,abdomen soft and non-tender without masses, organomegaly or hernias noted. Rectal:  Pos external hemorrhoids, not thrombosed. Genitalia:  Deferred-has GYN Msk:  No deformity or scoliosis noted of thoracic or lumbar spine.   Extremities:  No clubbing, cyanosis, edema, or deformity noted with normal full range of motion of all joints.   Neurologic:  alert & oriented X3 and gait normal.   Skin:  Intact without suspicious lesions or rashes Cervical Nodes:  No lymphadenopathy noted Axillary Nodes:  No palpable lymphadenopathy Psych:  Cognition and judgment appear intact. Alert and cooperative with normal attention span and concentration. No apparent delusions, illusions, hallucinations     Assessment & Plan:   1. Routine general medical examination at a health care facility  Reviewed preventive care protocols, scheduled due services, and updated immunizations Discussed nutrition, exercise, diet, and healthy lifestyle.    2. Need for Tdap vaccination  Tdap vaccine greater than or equal to 7yo IM  3. External hemorrhoid, bleeding   Deteriorated.  Sent rx for anusol, advised sitz baths.  Discussed hemorrhoidectomy.  She will think about it and let me know for referral.

## 2011-03-20 ENCOUNTER — Telehealth: Payer: Self-pay | Admitting: Family Medicine

## 2011-03-20 DIAGNOSIS — K644 Residual hemorrhoidal skin tags: Secondary | ICD-10-CM

## 2011-03-20 NOTE — Telephone Encounter (Signed)
Referral placed.

## 2011-03-20 NOTE — Telephone Encounter (Signed)
Patient would like that referral she discussed with you regarding possible hemmoidectomy. Suppositories have worked some but wants to take care of problem. Call her back at Work#[phone].

## 2011-03-28 ENCOUNTER — Ambulatory Visit: Payer: PRIVATE HEALTH INSURANCE | Admitting: Gastroenterology

## 2011-04-04 ENCOUNTER — Ambulatory Visit (INDEPENDENT_AMBULATORY_CARE_PROVIDER_SITE_OTHER): Payer: PRIVATE HEALTH INSURANCE | Admitting: General Surgery

## 2011-04-04 ENCOUNTER — Encounter (INDEPENDENT_AMBULATORY_CARE_PROVIDER_SITE_OTHER): Payer: Self-pay | Admitting: General Surgery

## 2011-04-04 VITALS — BP 118/63 | HR 72 | Temp 97.7°F | Resp 16 | Ht 59.0 in | Wt 113.4 lb

## 2011-04-04 DIAGNOSIS — K644 Residual hemorrhoidal skin tags: Secondary | ICD-10-CM

## 2011-04-04 MED ORDER — HYDROCORTISONE 2.5 % RE CREA: TOPICAL_CREAM | RECTAL | Status: DC

## 2011-04-04 MED ORDER — HYDROCORTISONE ACETATE 25 MG RE SUPP: 25.0000 mg | Freq: Three times a day (TID) | RECTAL | Status: DC

## 2011-04-04 NOTE — Assessment & Plan Note (Signed)
Pt has a large prolapsed internal hemorrhoid that is very friable and bleeding. Injected the internal hemorrhoids circumferentially. Pt needs sitz baths Anusol suppositories to anal canal and anusol cream to external hemorrhoids.   Stools are already soft.   Follow up in 6 weeks.

## 2011-04-04 NOTE — Patient Instructions (Signed)
Hemorrhoids  Hemorrhoids are enlarged (dilated) veins around the rectum. There are 2 types of hemorrhoids, and the type of hemorrhoid is determined by its location. Internal hemorrhoids occur in the veins just inside the rectum.They are usually not painful, but they may bleed.However, they may poke through to the outside and become irritated and painful. External hemorrhoids involve the veins outside the anus and can be felt as a painful swelling or hard lump near the anus.They are often itchy and may crack and bleed. Sometimes clots will form in the veins. This makes them swollen and painful. These are called thrombosed hemorrhoids. CAUSES Causes of hemorrhoids include:  Pregnancy. This increases the pressure in the hemorrhoidal veins.   Constipation.   Straining to have a bowel movement.   Obesity.   Heavy lifting or other activity that caused you to strain.   TREATMENT Most of the time hemorrhoids improve in 1 to 2 weeks. However, if symptoms do not seem to be getting better or if you have a lot of rectal bleeding, your caregiver may perform a procedure to help make the hemorrhoids get smaller or remove them completely.Possible treatments include:  Rubber band ligation. A rubber band is placed at the base of the hemorrhoid to cut off the circulation.   Sclerotherapy. A chemical is injected to shrink the hemorrhoid.   Infrared light therapy. Tools are used to burn the hemorrhoid.   Hemorrhoidectomy. This is surgical removal of the hemorrhoid.   HOME CARE INSTRUCTIONS   Increase fiber in your diet. Ask your caregiver about using fiber supplements.   Drink enough water and fluids to keep your urine clear or pale yellow.   Exercise regularly.   Go to the bathroom when you have the urge to have a bowel movement. Do not wait.   Avoid straining to have bowel movements.   Keep the anal area dry and clean.   Only take over-the-counter or prescription medicines for pain,  discomfort, or fever as directed by your caregiver.   Take warm sitz baths for 20 to 30 minutes, 3 to 4 times per day.   If the hemorrhoids are very tender and swollen, place ice packs on the area as tolerated. Using ice packs between sitz baths may be helpful. Fill a plastic bag with ice. Place a towel between the bag of ice and your skin.   Medicated creams and suppositories may be used or applied as directed.   Do not use a donut-shaped pillow or sit on the toilet for long periods. This increases blood pooling and pain.   SEEK MEDICAL CARE IF:   You have increasing pain and swelling that is not controlled with your medicine.   You have uncontrolled bleeding.   You have difficulty or you are unable to have a bowel movement.   You have pain or inflammation outside the area of the hemorrhoids.   You have chills or an oral temperature above 102 F (38.9 C).     

## 2011-04-04 NOTE — Progress Notes (Signed)
Chief Complaint  Patient presents with  . Other    new pt- eval ext hems    HISTORY: Patient is a 61 year old female who presents with hemorrhoids for many years. Over the past year they have become more bothersome. She started noticing a significant amount of bleeding around 2 weeks ago. She does not have hard stools and does not strain to have bowel movements. She does not spend a long time on the toilet. She does be a high fiber diet and exercises daily. She tried suppositories that were given to her by Dr. Dayton Martes and these helped for several days but then it stopped helping. She now has difficulty cleaning around her anus. She only has occasional soreness. She has been on a stool softener as well despite having soft stools.Or in a  Past Medical History  Diagnosis Date  . Hyperlipidemia   . Kidney stones   . Arthritis   . History of diverticulitis of colon   . Hemorrhoids     Past Surgical History  Procedure Date  . Vaginal hysterectomy     total  . Knee surgery 07/2006    right  . Breast cyst excision     X2  . Colonoscopy nov 2011    Current Outpatient Prescriptions  Medication Sig Dispense Refill  . Black Cohosh Root 450 MG CAPS Take 1 capsule by mouth every other day.       . calcium citrate-vitamin D (CITRACAL+D) 315-200 MG-UNIT per tablet Take 1 tablet by mouth daily.        . Cholecalciferol (VITAMIN D3) 2000 UNITS TABS Take 1 tablet by mouth daily.        . Glucosamine Sulfate 1000 MG CAPS Take 1 capsule by mouth daily.        . Multiple Vitamin (MULTIVITAMIN) tablet Take 1 tablet by mouth daily.        . vitamin E 400 UNIT capsule Take 400 Units by mouth 2 (two) times daily.        . hydrocortisone (ANUSOL-HC) 2.5 % rectal cream Apply TID to perianal skin  30 g  4  . hydrocortisone (ANUSOL-HC) 25 MG suppository Place 1 suppository (25 mg total) rectally 3 (three) times daily.  42 suppository  4     No Known Allergies   Family History  Problem Relation Age of  Onset  . Atrial fibrillation Mother   . Thyroid disease Mother   . Hypertension Father   . Heart disease Father     heart attack  . Cancer Paternal Uncle     leukemia  . Cancer Maternal Grandmother     leukemia  . Cancer Paternal Grandfather     leukemia     History   Social History  . Marital Status: Single    Spouse Name: N/A    Number of Children: 1  . Years of Education: N/A   Occupational History  . Medical Receptionist    Social History Main Topics  . Smoking status: Former Smoker -- 30 years    Types: Cigarettes  . Smokeless tobacco: None  . Alcohol Use: No  . Drug Use: No  . Sexually Active: None    REVIEW OF SYSTEMS - PERTINENT POSITIVES ONLY: 12 point review of systems negative other than HPI and PMH except for arthritis.    EXAM: Filed Vitals:   04/04/11 1026  BP: 118/63  Pulse: 72  Temp: 97.7 F (36.5 C)  Resp: 16    Gen:  No acute  distress.  Well nourished and well groomed.  Neurological: Alert and oriented to person, place, and time. Coordination normal.  Head: Normocephalic and atraumatic.  Eyes: Conjunctivae are normal. Pupils are equal, round, and reactive to light. No scleral icterus.  Neck: Normal range of motion.  Cardiovascular: Normal rate. Respiratory: Effort normal.  No respiratory distress. GI: Soft. The abdomen is soft and nontender.  Rectal:  There is a large prolapsed internal hemorrhoid and circumferential external hemorrhoids that are friable.  On anoscopy, there are circumferential internal hemorrhoids.   Musculoskeletal: Normal range of motion. Extremities are nontender.  Lymphadenopathy: No cervical, preauricular, postauricular or axillary adenopathy is present Skin: Skin is warm and dry. No rash noted. No diaphoresis. No erythema. No pallor. No clubbing, cyanosis, or edema.   Psychiatric: Normal mood and affect. Behavior is normal. Judgment and thought content normal.     ASSESSMENT AND PLAN:   Internal and external  hemorrhoids, internal with prolapse and bleeding. Pt has a large prolapsed internal hemorrhoid that is very friable and bleeding. Injected the internal hemorrhoids circumferentially. Pt needs sitz baths Anusol suppositories to anal canal and anusol cream to external hemorrhoids.   Stools are already soft.   Follow up in 6 weeks.      Maudry Diego MD Surgical Oncology, General and Endocrine Surgery [facility]   Visit Diagnoses: 1. External hemorrhoid, bleeding     Primary Care Physician: Ruthe Mannan, MD, MD

## 2011-05-26 ENCOUNTER — Ambulatory Visit (INDEPENDENT_AMBULATORY_CARE_PROVIDER_SITE_OTHER): Payer: PRIVATE HEALTH INSURANCE | Admitting: General Surgery

## 2011-05-26 ENCOUNTER — Encounter (INDEPENDENT_AMBULATORY_CARE_PROVIDER_SITE_OTHER): Payer: Self-pay | Admitting: General Surgery

## 2011-05-26 VITALS — BP 116/76 | HR 68 | Temp 96.8°F | Resp 16 | Ht 60.0 in | Wt 114.4 lb

## 2011-05-26 DIAGNOSIS — K644 Residual hemorrhoidal skin tags: Secondary | ICD-10-CM

## 2011-05-26 NOTE — Progress Notes (Signed)
HISTORY: Pt is doing around 50% better since I saw her.  She has been doing anusol suppositories and cream.  She quit doing sitz baths around 2-3 weeks ago.  She had one episode of bleeding 2 weeks ago.  She is no longer having pain or soreness.  She is having good bowel habits with soft stools and is not spending a lot of time on the toilet.     PERTINENT REVIEW OF SYSTEMS: Otherwise negative.     EXAM: Head: Normocephalic and atraumatic.  Eyes:  Conjunctivae are normal. Pupils are equal, round, and reactive to light. No scleral icterus.  Neck:  Normal range of motion. Neck supple. No tracheal deviation present. No thyromegaly present.  Resp: No respiratory distress, normal effort. Abd:  Anoscopy demonstrates circumferential internal and external hemorrhoids.  Both are improved.  The prolapsed hemorrhoid is better.    Neurological: Alert and oriented to person, place, and time. Coordination normal.  Skin: Skin is warm and dry. No rash noted. No diaphoretic. No erythema. No pallor.  Psychiatric: Normal mood and affect. Normal behavior. Judgment and thought content normal.    ASSESSMENT AND PLAN:   Internal and external hemorrhoids, internal with prolapse and bleeding. Improved Reinjected internal hemorrhoids Continue anusol cream externally Resume sitz baths Follow up in 8 weeks.       Maudry Diego, MD Surgical Oncology, General & Endocrine Surgery [facility]  Ruthe Mannan, MD, MD Ruthe Mannan, MD

## 2011-05-26 NOTE — Assessment & Plan Note (Signed)
Improved Reinjected internal hemorrhoids Continue anusol cream externally Resume sitz baths Follow up in 8 weeks.

## 2011-05-26 NOTE — Patient Instructions (Signed)
Start back on sitz baths.  Continue anusol cream  May hold suppositories.  Follow up in 8 weeks  Avoid constipation.

## 2011-09-01 ENCOUNTER — Encounter (INDEPENDENT_AMBULATORY_CARE_PROVIDER_SITE_OTHER): Payer: PRIVATE HEALTH INSURANCE | Admitting: General Surgery

## 2011-11-07 ENCOUNTER — Encounter (INDEPENDENT_AMBULATORY_CARE_PROVIDER_SITE_OTHER): Payer: Self-pay | Admitting: General Surgery

## 2011-11-07 ENCOUNTER — Ambulatory Visit (INDEPENDENT_AMBULATORY_CARE_PROVIDER_SITE_OTHER): Payer: PRIVATE HEALTH INSURANCE | Admitting: General Surgery

## 2011-11-07 VITALS — BP 122/74 | HR 76 | Temp 98.0°F | Resp 14 | Ht 60.0 in | Wt 115.6 lb

## 2011-11-07 DIAGNOSIS — K644 Residual hemorrhoidal skin tags: Secondary | ICD-10-CM

## 2011-11-07 DIAGNOSIS — K648 Other hemorrhoids: Secondary | ICD-10-CM

## 2011-11-07 NOTE — Assessment & Plan Note (Signed)
Injected internal hemorrhoids again.

## 2011-11-07 NOTE — Patient Instructions (Addendum)
Go back to using suppositories and cream twice daily.  Add ice packs to perineum once daily.  Follow up in 6-8 weeks.

## 2011-11-07 NOTE — Progress Notes (Signed)
HISTORY: Pt did very well for around one to 2 months after I injected her.  She since has been starting to have bleeding again. She also has a sense of prolapse. .  She has been doing sitz baths.  She has been taking her stool softener.  She did use the suppositories for a while. She stopped taking them around the clock around a month ago.  She continues to have bleeding with almost every bowel movement now.  She is having good bowel habits with soft stools and is not spending a lot of time on the toilet.     PERTINENT REVIEW OF SYSTEMS: Otherwise negative.     EXAM: Head: Normocephalic and atraumatic.  Eyes:  Conjunctivae are normal. Pupils are equal, round, and reactive to light. No scleral icterus.  Neck:  Normal range of motion. Neck supple. No tracheal deviation present. No thyromegaly present.  Resp: No respiratory distress, normal effort. Rectal:  Anoscopy demonstrates circumferential internal and external hemorrhoids.  Internal hemorrhoids are injected.  External hemorrhoids are moderate and are inflamed.      Neurological: Alert and oriented to person, place, and time. Coordination normal.  Skin: Skin is warm and dry. No rash noted. No diaphoretic. No erythema. No pallor.  Psychiatric: Normal mood and affect. Normal behavior. Judgment and thought content normal.    ASSESSMENT AND PLAN:   Internal and external hemorrhoids, internal with prolapse and bleeding. Injected internal hemorrhoids again.    I recommended that she resume the suppositories for the internal hemorrhoids and the Anusol to the external hemorrhoids. I discussed that surgical treatment of hemorrhoids is based on level of symptoms.  If her symptoms are severe enough, she may wish to proceed with surgical therapy. She did have a reasonable result from the injection and so we will try this again. I will follow her up again in 6-8 weeks to see how she is doing.  Maudry Diego, MD Surgical Oncology, General &  Endocrine Surgery [facility]  Ruthe Mannan, MD Dianne Dun, MD

## 2012-01-02 ENCOUNTER — Ambulatory Visit (INDEPENDENT_AMBULATORY_CARE_PROVIDER_SITE_OTHER): Payer: PRIVATE HEALTH INSURANCE | Admitting: General Surgery

## 2012-01-02 ENCOUNTER — Encounter (INDEPENDENT_AMBULATORY_CARE_PROVIDER_SITE_OTHER): Payer: Self-pay | Admitting: General Surgery

## 2012-01-02 VITALS — BP 116/66 | HR 70 | Temp 97.4°F | Resp 14 | Ht 60.0 in | Wt 114.5 lb

## 2012-01-02 DIAGNOSIS — K644 Residual hemorrhoidal skin tags: Secondary | ICD-10-CM

## 2012-01-02 DIAGNOSIS — K648 Other hemorrhoids: Secondary | ICD-10-CM

## 2012-01-02 MED ORDER — HYDROCORTISONE 2.5 % RE CREA: TOPICAL_CREAM | RECTAL | Status: DC

## 2012-01-02 NOTE — Assessment & Plan Note (Signed)
Doing better.  Follow up as needed.   Advised about avoiding constipation, straining, and spending a lot of time on toilet.  Refilled anusol.

## 2012-01-02 NOTE — Patient Instructions (Signed)
Take break from anusol.  Follow up as needed.  AVOID CONSTIPATION AND AVOID STRAINING.

## 2012-01-02 NOTE — Progress Notes (Signed)
HISTORY: Pt doing very well since I injected her in June.  She is no longer having bleeding or sensation of prolapse.  She has been using the anusol cream.  She has been drinking lots of water and taking her stool softeners.     PERTINENT REVIEW OF SYSTEMS: Otherwise negative.     EXAM: Head: Normocephalic and atraumatic.  Eyes:  Conjunctivae are normal. Pupils are equal, round, and reactive to light. No scleral icterus.  Neck:  Normal range of motion. Neck supple. No tracheal deviation present. No thyromegaly present.  Resp: No respiratory distress, normal effort. Rectal:  Anoscopy not performed.  External hemorrhoids are moderate in size and are uninflamed.      Neurological: Alert and oriented to person, place, and time. Coordination normal.  Skin: Skin is warm and dry. No rash noted. No diaphoretic. No erythema. No pallor.  Psychiatric: Normal mood and affect. Normal behavior. Judgment and thought content normal.    ASSESSMENT AND PLAN:   Hemorrhoids, internal, with bleeding Doing better.  Follow up as needed.   Advised about avoiding constipation, straining, and spending a lot of time on toilet.  Refilled anusol.  I recommended that she take a break for at least 6-8 weeks on the anusol.    Maudry Diego, MD Surgical Oncology, General & Endocrine Surgery [facility]  Ruthe Mannan, MD Dianne Dun, MD

## 2012-04-05 ENCOUNTER — Encounter: Payer: PRIVATE HEALTH INSURANCE | Admitting: Family Medicine

## 2013-02-28 ENCOUNTER — Telehealth: Payer: Self-pay | Admitting: Gastroenterology

## 2013-02-28 ENCOUNTER — Encounter: Payer: Self-pay | Admitting: Gastroenterology

## 2013-02-28 NOTE — Telephone Encounter (Signed)
Informed pt Dr Jarold Motto will see her tomorrow at 11:15am; pt stated understanding.

## 2013-02-28 NOTE — Telephone Encounter (Signed)
Pt has not ben seen since 2011 after a diverticular flair. She was placed on Cipro and Flagyl and finally recoved after a few weeks and Florstor was added. Last COLON 03/25/10 showing moderate diverticulosis in the sigmoid. Today, pt reports she just doesn't feel good. She reports pain in the LLQ, nausea and queasiness, diarrhea, but no blood or fever. Pt was given Cipro and Flagyl in 2011 and wants to know if you will order the AB again or OV? Thanks.

## 2013-02-28 NOTE — Telephone Encounter (Signed)
Come in II:15 tomorrow for OV

## 2013-03-01 ENCOUNTER — Ambulatory Visit (INDEPENDENT_AMBULATORY_CARE_PROVIDER_SITE_OTHER): Payer: PRIVATE HEALTH INSURANCE | Admitting: Gastroenterology

## 2013-03-01 ENCOUNTER — Other Ambulatory Visit (INDEPENDENT_AMBULATORY_CARE_PROVIDER_SITE_OTHER): Payer: PRIVATE HEALTH INSURANCE

## 2013-03-01 ENCOUNTER — Encounter: Payer: Self-pay | Admitting: Gastroenterology

## 2013-03-01 VITALS — BP 110/70 | HR 71 | Ht 59.0 in | Wt 116.1 lb

## 2013-03-01 DIAGNOSIS — K5732 Diverticulitis of large intestine without perforation or abscess without bleeding: Secondary | ICD-10-CM

## 2013-03-01 DIAGNOSIS — K5790 Diverticulosis of intestine, part unspecified, without perforation or abscess without bleeding: Secondary | ICD-10-CM

## 2013-03-01 DIAGNOSIS — R197 Diarrhea, unspecified: Secondary | ICD-10-CM

## 2013-03-01 DIAGNOSIS — R1032 Left lower quadrant pain: Secondary | ICD-10-CM

## 2013-03-01 DIAGNOSIS — K5730 Diverticulosis of large intestine without perforation or abscess without bleeding: Secondary | ICD-10-CM

## 2013-03-01 LAB — CBC WITH DIFFERENTIAL/PLATELET
Basophils Absolute: 0.1 10*3/uL (ref 0.0–0.1)
Lymphocytes Relative: 28.9 % (ref 12.0–46.0)
MCV: 92.3 fl (ref 78.0–100.0)
Monocytes Relative: 8.6 % (ref 3.0–12.0)
Neutrophils Relative %: 60 % (ref 43.0–77.0)
Platelets: 251 10*3/uL (ref 150.0–400.0)
RDW: 12.6 % (ref 11.5–14.6)

## 2013-03-01 LAB — HEPATIC FUNCTION PANEL
ALT: 14 U/L (ref 0–35)
AST: 18 U/L (ref 0–37)
Albumin: 4.3 g/dL (ref 3.5–5.2)
Total Bilirubin: 0.7 mg/dL (ref 0.3–1.2)
Total Protein: 7.2 g/dL (ref 6.0–8.3)

## 2013-03-01 LAB — BASIC METABOLIC PANEL
CO2: 27 mEq/L (ref 19–32)
Calcium: 9.6 mg/dL (ref 8.4–10.5)
Chloride: 104 mEq/L (ref 96–112)
Glucose, Bld: 92 mg/dL (ref 70–99)
Potassium: 4.3 mEq/L (ref 3.5–5.1)
Sodium: 142 mEq/L (ref 135–145)

## 2013-03-01 LAB — TSH: TSH: 3.68 u[IU]/mL (ref 0.35–5.50)

## 2013-03-01 LAB — SEDIMENTATION RATE: Sed Rate: 18 mm/hr (ref 0–22)

## 2013-03-01 LAB — C-REACTIVE PROTEIN: CRP: <0.5 mg/dL (ref 0.5–20.0)

## 2013-03-01 MED ORDER — METRONIDAZOLE 500 MG PO TABS: 500.0000 mg | ORAL_TABLET | Freq: Two times a day (BID) | ORAL | Status: DC

## 2013-03-01 MED ORDER — CIPROFLOXACIN HCL 500 MG PO TABS: 500.0000 mg | ORAL_TABLET | Freq: Two times a day (BID) | ORAL | Status: DC

## 2013-03-01 MED ORDER — TRAMADOL HCL 50 MG PO TABS: 50.0000 mg | ORAL_TABLET | Freq: Four times a day (QID) | ORAL | Status: DC | PRN

## 2013-03-01 NOTE — Patient Instructions (Signed)
You have been scheduled for a CT scan of the abdomen and pelvis at [place] ([address]---this is in the same building as [facility]).   You are scheduled on 03-03-2013 at 230 pm. You should arrive 15 minutes prior to your appointment time for registration. Please follow the written instructions below on the day of your exam:  WARNING: IF YOU ARE ALLERGIC TO IODINE/X-RAY DYE, PLEASE NOTIFY RADIOLOGY IMMEDIATELY AT [phone]! YOU WILL BE GIVEN A 13 HOUR PREMEDICATION PREP.  1) Do not eat or drink anything after 1030 am (4 hours prior to your test) 2) You have been given 2 bottles of oral contrast to drink. The solution may taste better if refrigerated, but do NOT add ice or any other liquid to this solution. Shake well before drinking.    Drink 1 bottle of contrast @ 1230 pm (2 hours prior to your exam)  Drink 1 bottle of contrast @ 130 pm (1 hour prior to your exam)  You may take any medications as prescribed with a small amount of water except for the following: Metformin, Glucophage, Glucovance, Avandamet, Riomet, Fortamet, Actoplus Met, Janumet, Glumetza or Metaglip. The above medications must be held the day of the exam AND 48 hours after the exam.  The purpose of you drinking the oral contrast is to aid in the visualization of your intestinal tract. The contrast solution may cause some diarrhea. Before your exam is started, you will be given a small amount of fluid to drink. Depending on your individual set of symptoms, you may also receive an intravenous injection of x-ray contrast/dye. Plan on being at [facility] for 30 minutes or long, depending on the type of exam you are having performed.  This test typically takes 30-45 minutes to complete.  If you have any questions regarding your exam or if you need to reschedule, you may call the CT department at [phone] between the hours of 8:00 am and 5:00 pm,  Monday-Friday.  ____________________________________________________________________________________________________________  We have sent the following medications to your pharmacy for you to pick up at your convenience: Tramadol, please take as directed for pain  Cipro 500 mg, please take one tablet by mouth twice daily for ten days Flagyl 500 mg, please take one tablet by mouth twice daily for ten days   Please purchase Align over the counter, this puts good bacteria back into your colon. You should take 1 capsule by mouth once daily.  Your physician has requested that you go to the basement for the following lab work before leaving today: CRP CBC TSH BMP Sedimentation Rate  Liver Function Panel

## 2013-03-01 NOTE — Progress Notes (Signed)
History of Present Illness:  This is a 62 year old Caucasian female that is said 2 weeks of left lower quadrant pain and some associated watery diarrhea.  She gives a past history of having had diverticulitis with a microperforation in June of 2011.  Colonoscopy in the past  showed severe diverticulosis in the sigmoid colon.  She denies recent antibiotic use, foreign travel, or infectious disease exposure, melena or hematochezia.  She also denies fever, chills, or systemic complaints, but generally does not feel well.  There is no history of any specific food intolerances, abuse of sorbitol or fructose.  She is otherwise in good health and denies chronic medical problems.  She denies gynecologic or urologic problems.  I have reviewed this patient's present history, medical and surgical past history, allergies and medications.     ROS:   All systems were reviewed and are negative unless otherwise stated in the HPI.  No Known Allergies Outpatient Prescriptions Prior to Visit  Medication Sig Dispense Refill  . calcium citrate-vitamin D (CITRACAL+D) 315-200 MG-UNIT per tablet Take 1 tablet by mouth daily.        . Cholecalciferol (VITAMIN D3) 2000 UNITS TABS Take 1 tablet by mouth daily.        . Glucosamine Sulfate 1000 MG CAPS Take 1 capsule by mouth daily.        . hydrocortisone (ANUSOL-HC) 2.5 % rectal cream Apply TID to perianal skin  30 g  4  . Multiple Vitamin (MULTIVITAMIN) tablet Take 1 tablet by mouth daily.        . vitamin E 400 UNIT capsule Take 400 Units by mouth 2 (two) times daily.        . Black Cohosh Root 450 MG CAPS Take 1 capsule by mouth every other day.        No facility-administered medications prior to visit.   Past Medical History  Diagnosis Date  . Hyperlipidemia   . Kidney stones   . Arthritis   . History of diverticulitis of colon   . Hemorrhoids    Past Surgical History  Procedure Laterality Date  . Vaginal hysterectomy      total  . Knee surgery  07/2006     right  . Breast cyst excision      X2  . Colonoscopy  nov 2011   History   Social History  . Marital Status: Single    Spouse Name: N/A    Number of Children: 1  . Years of Education: N/A   Occupational History  . Medical Receptionist    Social History Main Topics  . Smoking status: Former Smoker -- 30 years    Types: Cigarettes  . Smokeless tobacco: Never Used  . Alcohol Use: No  . Drug Use: No  . Sexual Activity: None   Other Topics Concern  . None   Social History Narrative  . None   Family History  Problem Relation Age of Onset  . Atrial fibrillation Mother   . Thyroid disease Mother   . Hypertension Father   . Heart disease Father     heart attack  . Cancer Paternal Uncle     leukemia  . Cancer Maternal Grandmother     leukemia  . Cancer Paternal Grandfather     leukemia       Physical Exam: Blood pressure 110/70, pulse 71 and regular and weight 116. General well developed well nourished patient in no acute distress, appearing their stated age Eyes PERRLA, no icterus, fundoscopic  exam per opthamologist Skin no lesions noted Neck supple, no adenopathy, no thyroid enlargement, no tenderness Chest clear to percussion and auscultation Heart no significant murmurs, gallops or rubs noted Abdomen no hepatosplenomegaly masses or tenderness, BS normal.  There is mild distention and some tenderness the left lower quadrant to mild palpation.  Rebound tenderness not appreciated. Rectal inspection normal no fissures, or fistulae noted.  No masses or tenderness on digital exam. Stool guaiac negative.  Stool is formed.  There are some perianal swelling skin tags but no fissures or fistulae.  No perirectal mass noted. Extremities no acute joint lesions, edema, phlebitis or evidence of cellulitis. Neurologic patient oriented x 3, cranial nerves intact, no focal neurologic deficits noted. Psychological mental status normal and normal affect.  Assessment and plan:  Subacute diverticulitis in a patient had a simi;ar previous attack 3 years ago.  I have placed her on Cipro 500 mg twice a day and metronidazole 500 mg twice a day for 10 days with when necessary tramadol 50 mg every 6-8 hours,Align probiotic per her loose stools, low fiber diet, and we'll go ahead and check CBC, sedimentation rate, CRP, and CT scan of the abdomen and pelvis.  I'm concerned that she's had 2 episodes of diverticulitis in the last 3 years.  Review of her colonoscopy reveils a very redundant and difficult colonoscopy  Per her floppy sigmoid colon area.  Random colon biopsies were done at that time and were unremarkable.  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  Office visit for 2 weeks scheduled or when necessary as needed.

## 2013-03-02 ENCOUNTER — Ambulatory Visit (INDEPENDENT_AMBULATORY_CARE_PROVIDER_SITE_OTHER)
Admission: RE | Admit: 2013-03-02 | Discharge: 2013-03-02 | Disposition: A | Discharge status: Home / Self Care | Payer: PRIVATE HEALTH INSURANCE | Source: Ambulatory Visit | Attending: Gastroenterology | Admitting: Gastroenterology

## 2013-03-02 DIAGNOSIS — K5790 Diverticulosis of intestine, part unspecified, without perforation or abscess without bleeding: Secondary | ICD-10-CM

## 2013-03-02 DIAGNOSIS — K5730 Diverticulosis of large intestine without perforation or abscess without bleeding: Secondary | ICD-10-CM

## 2013-03-02 MED ORDER — IOHEXOL 300 MG/ML  SOLN
100.0000 mL | Freq: Once | INTRAMUSCULAR | Status: AC | PRN
  Administered 2013-03-02: 100 mL via INTRAVENOUS

## 2013-03-03 ENCOUNTER — Other Ambulatory Visit: Payer: PRIVATE HEALTH INSURANCE

## 2013-03-15 ENCOUNTER — Ambulatory Visit: Payer: PRIVATE HEALTH INSURANCE | Admitting: Gastroenterology

## 2013-09-02 ENCOUNTER — Ambulatory Visit (INDEPENDENT_AMBULATORY_CARE_PROVIDER_SITE_OTHER): Payer: PRIVATE HEALTH INSURANCE | Admitting: General Surgery

## 2013-09-02 ENCOUNTER — Encounter (INDEPENDENT_AMBULATORY_CARE_PROVIDER_SITE_OTHER): Payer: Self-pay | Admitting: General Surgery

## 2013-09-02 VITALS — BP 110/60 | HR 77 | Temp 98.1°F | Resp 14 | Ht 59.0 in | Wt 117.6 lb

## 2013-09-02 DIAGNOSIS — K648 Other hemorrhoids: Secondary | ICD-10-CM

## 2013-09-02 MED ORDER — HYDROCORTISONE 2.5 % RE CREA: 1.0000 "application " | TOPICAL_CREAM | Freq: Two times a day (BID) | RECTAL | Status: DC

## 2013-09-02 NOTE — Patient Instructions (Signed)
Add back stool softener every other day.  Use anusol cream on outside and internally.  Follow up in 6-8 weeks.

## 2013-09-10 NOTE — Progress Notes (Signed)
HISTORY: Pt had been doing well for several years after injected last.  She started having bleeding again 3 weeks ago.  She denies pain, but has a sense of pressure.  She used some suppositories, and this has helped, but she is not better.  She has been taking stool softeners every 2-3 days.  She did have an episode of constipation 3 weeks ago.       PERTINENT REVIEW OF SYSTEMS: Otherwise negative.     EXAM: Head: Normocephalic and atraumatic.  Eyes:  Conjunctivae are normal. Pupils are equal, round, and reactive to light. No scleral icterus.  Neck:  Normal range of motion. Neck supple. No tracheal deviation present. No thyromegaly present.  Resp: No respiratory distress, normal effort. Rectal:  moderate external hemorrhoids seen.  Anoscopy demonstrates circumferential internal hemorrhoids.  These are injected with sclerosing solution..      Neurological: Alert and oriented to person, place, and time. Coordination normal.  Skin: Skin is warm and dry. No rash noted. No diaphoretic. No erythema. No pallor.  Psychiatric: Normal mood and affect. Normal behavior. Judgment and thought content normal.    ASSESSMENT AND PLAN:   Hemorrhoids, internal, with bleeding Injected internal hemorrhoids.  Advised to continue anusol cream internally and externally.    Add stool softener back daily.    Follow up in 6 weeks.    reviewed good bowel habits.     Milus Height, MD Surgical Oncology, General & Endocrine Surgery [facility]  PROVIDER NOT IN SYSTEM Lucille Passy, MD

## 2013-09-10 NOTE — Assessment & Plan Note (Addendum)
Injected internal hemorrhoids.  Advised to continue anusol cream internally and externally.    Add stool softener back daily.    Follow up in 6 weeks.

## 2013-10-25 ENCOUNTER — Ambulatory Visit (INDEPENDENT_AMBULATORY_CARE_PROVIDER_SITE_OTHER): Payer: PRIVATE HEALTH INSURANCE | Admitting: General Surgery

## 2014-02-16 ENCOUNTER — Encounter: Payer: Self-pay | Admitting: Gastroenterology

## 2014-10-17 ENCOUNTER — Encounter: Payer: Self-pay | Admitting: Internal Medicine

## 2014-10-17 ENCOUNTER — Ambulatory Visit (INDEPENDENT_AMBULATORY_CARE_PROVIDER_SITE_OTHER): Payer: No Typology Code available for payment source | Admitting: Internal Medicine

## 2014-10-17 ENCOUNTER — Encounter (INDEPENDENT_AMBULATORY_CARE_PROVIDER_SITE_OTHER): Payer: Self-pay

## 2014-10-17 ENCOUNTER — Other Ambulatory Visit (HOSPITAL_COMMUNITY)
Admission: RE | Admit: 2014-10-17 | Discharge: 2014-10-17 | Disposition: A | Discharge status: Home / Self Care | Payer: PRIVATE HEALTH INSURANCE | Source: Ambulatory Visit | Attending: General Surgery | Admitting: General Surgery

## 2014-10-17 VITALS — BP 104/62 | HR 72 | Temp 98.2°F | Resp 16 | Ht 59.5 in | Wt 101.0 lb

## 2014-10-17 DIAGNOSIS — Z79899 Other long term (current) drug therapy: Secondary | ICD-10-CM

## 2014-10-17 DIAGNOSIS — Z131 Encounter for screening for diabetes mellitus: Secondary | ICD-10-CM

## 2014-10-17 DIAGNOSIS — F17200 Nicotine dependence, unspecified, uncomplicated: Secondary | ICD-10-CM

## 2014-10-17 DIAGNOSIS — N6019 Diffuse cystic mastopathy of unspecified breast: Secondary | ICD-10-CM

## 2014-10-17 DIAGNOSIS — E559 Vitamin D deficiency, unspecified: Secondary | ICD-10-CM

## 2014-10-17 DIAGNOSIS — Z01419 Encounter for gynecological examination (general) (routine) without abnormal findings: Secondary | ICD-10-CM | POA: Diagnosis not present

## 2014-10-17 DIAGNOSIS — Z1151 Encounter for screening for human papillomavirus (HPV): Secondary | ICD-10-CM | POA: Insufficient documentation

## 2014-10-17 DIAGNOSIS — Z780 Asymptomatic menopausal state: Secondary | ICD-10-CM

## 2014-10-17 DIAGNOSIS — E785 Hyperlipidemia, unspecified: Secondary | ICD-10-CM

## 2014-10-17 DIAGNOSIS — Z1389 Encounter for screening for other disorder: Secondary | ICD-10-CM

## 2014-10-17 DIAGNOSIS — Z124 Encounter for screening for malignant neoplasm of cervix: Secondary | ICD-10-CM

## 2014-10-17 DIAGNOSIS — Z1212 Encounter for screening for malignant neoplasm of rectum: Secondary | ICD-10-CM

## 2014-10-17 DIAGNOSIS — Z130 Encounter for screening for diseases of the blood and blood-forming organs and certain disorders involving the immune mechanism: Secondary | ICD-10-CM

## 2014-10-17 DIAGNOSIS — Z1329 Encounter for screening for other suspected endocrine disorder: Secondary | ICD-10-CM

## 2014-10-17 DIAGNOSIS — Z0000 Encounter for general adult medical examination without abnormal findings: Secondary | ICD-10-CM

## 2014-10-17 LAB — HEPATIC FUNCTION PANEL
ALK PHOS: 52 U/L (ref 39–117)
ALT: 16 U/L (ref 0–35)
AST: 17 U/L (ref 0–37)
Albumin: 4.4 g/dL (ref 3.5–5.2)
BILIRUBIN DIRECT: 0.1 mg/dL (ref 0.0–0.3)
Indirect Bilirubin: 0.4 mg/dL (ref 0.2–1.2)
Total Bilirubin: 0.5 mg/dL (ref 0.2–1.2)
Total Protein: 6.5 g/dL (ref 6.0–8.3)

## 2014-10-17 LAB — BASIC METABOLIC PANEL WITH GFR
BUN: 11 mg/dL (ref 6–23)
CALCIUM: 9.7 mg/dL (ref 8.4–10.5)
CHLORIDE: 105 meq/L (ref 96–112)
CO2: 28 meq/L (ref 19–32)
CREATININE: 0.58 mg/dL (ref 0.50–1.10)
GFR, Est African American: >89 mL/min
GFR, Est Non African American: >89 mL/min
GLUCOSE: 84 mg/dL (ref 70–99)
Potassium: 4.5 mEq/L (ref 3.5–5.3)
Sodium: 142 mEq/L (ref 135–145)

## 2014-10-17 LAB — CBC WITH DIFFERENTIAL/PLATELET
BASOS ABS: 0.1 10*3/uL (ref 0.0–0.1)
Basophils Relative: 2 % — ABNORMAL HIGH (ref 0–1)
EOS ABS: 0.1 10*3/uL (ref 0.0–0.7)
EOS PCT: 1 % (ref 0–5)
HEMATOCRIT: 40.3 % (ref 36.0–46.0)
HEMOGLOBIN: 13.2 g/dL (ref 12.0–15.0)
Lymphocytes Relative: 33 % (ref 12–46)
Lymphs Abs: 1.9 10*3/uL (ref 0.7–4.0)
MCH: 30.5 pg (ref 26.0–34.0)
MCHC: 32.8 g/dL (ref 30.0–36.0)
MCV: 93.1 fL (ref 78.0–100.0)
MPV: 10 fL (ref 8.6–12.4)
Monocytes Absolute: 0.6 10*3/uL (ref 0.1–1.0)
Monocytes Relative: 11 % (ref 3–12)
Neutro Abs: 3.1 10*3/uL (ref 1.7–7.7)
Neutrophils Relative %: 53 % (ref 43–77)
PLATELETS: 296 10*3/uL (ref 150–400)
RBC: 4.33 MIL/uL (ref 3.87–5.11)
RDW: 13.4 % (ref 11.5–15.5)
WBC: 5.8 10*3/uL (ref 4.0–10.5)

## 2014-10-17 LAB — IRON AND TIBC
%SAT: 38 % (ref 20–55)
IRON: 111 ug/dL (ref 42–145)
TIBC: 293 ug/dL (ref 250–470)
UIBC: 182 ug/dL (ref 125–400)

## 2014-10-17 LAB — LIPID PANEL
CHOLESTEROL: 236 mg/dL — AB (ref 0–200)
HDL: 92 mg/dL (ref >=46)
LDL Cholesterol: 131 mg/dL — ABNORMAL HIGH (ref 0–99)
Total CHOL/HDL Ratio: 2.6 Ratio
Triglycerides: 67 mg/dL (ref <150)
VLDL: 13 mg/dL (ref 0–40)

## 2014-10-17 LAB — VITAMIN B12: Vitamin B-12: 503 pg/mL (ref 211–911)

## 2014-10-17 LAB — MAGNESIUM: MAGNESIUM: 2.1 mg/dL (ref 1.5–2.5)

## 2014-10-17 LAB — TSH: TSH: 2.683 u[IU]/mL (ref 0.350–4.500)

## 2014-10-17 MED ORDER — LIDOCAINE VISCOUS 2 % MT SOLN: 20.0000 mL | OROMUCOSAL | Status: DC | PRN

## 2014-10-17 NOTE — Progress Notes (Signed)
Patient ID: [name], female   DOB: 1951-03-20, 64 y.o.   MRN: [id number]    Annual Screening Comprehensive Examination   This very nice 64 y.o.female presents for complete physical.  Patient has no major health issues.  Patient reports no complaints at this time.   Finally, patient has history of Vitamin D Deficiency and last vitamin D was No results found for: VD25OH.  Currently on supplementation  Patient reports that she has a history of diverticulitis which occasionally can flare and cause her issues.  She does follow with Dr. Sharlett Iles with this who did her colonoscopy 3 years ago.    She does not see an obgyn and has not had a pap in several years.    Patient does report a significant weight loss over the last 6 months.  She reports that she has normally weighed 120 lbs in the past and has recently gone down to 96 lbs without effort.  Her appetite is the same.  She does report a lot of stress.  She also reports that she is a current smoker.  She has been smoking 5 cigarettes daily and started smoking at age 11.    She is not up to date on mammograms.  She has also not had regular paps done.    No current outpatient prescriptions on file prior to visit.   No current facility-administered medications on file prior to visit.    No Known Allergies  Past Medical History  Diagnosis Date  . Hyperlipidemia   . Kidney stones   . Arthritis   . History of diverticulitis of colon   . Hemorrhoids     Immunization History  Administered Date(s) Administered  . Td 08/02/1996  . Tdap 03/10/2011    Past Surgical History  Procedure Laterality Date  . Vaginal hysterectomy      total  . Knee surgery  07/2006    right  . Breast cyst excision      X2  . Colonoscopy  nov 2011    Family History  Problem Relation Age of Onset  . Atrial fibrillation Mother   . Thyroid disease Mother   . Hypertension Father   . Heart disease Father     heart attack  . Cancer Paternal  Uncle     leukemia  . Cancer Maternal Grandmother     leukemia  . Cancer Paternal Grandfather     leukemia    History   Social History  . Marital Status: Single    Spouse Name: N/A  . Number of Children: 1  . Years of Education: N/A   Occupational History  . Medical Receptionist    Social History Main Topics  . Smoking status: Current Every Day Smoker -- 30 years    Types: Cigarettes  . Smokeless tobacco: Never Used  . Alcohol Use: No  . Drug Use: No  . Sexual Activity: Not on file   Other Topics Concern  . Not on file   Social History Narrative   Review of Systems  Constitutional: Positive for weight loss and malaise/fatigue. Negative for fever, chills and diaphoresis.  HENT: Positive for tinnitus. Negative for congestion, ear pain, nosebleeds and sore throat.   Eyes: Negative.   Respiratory: Negative for cough, shortness of breath and wheezing.   Cardiovascular: Negative for chest pain, palpitations and leg swelling.  Gastrointestinal: Negative for heartburn, nausea, vomiting, abdominal pain, diarrhea, constipation, blood in stool and melena.  Genitourinary: Negative for dysuria, urgency and frequency.  Skin: Negative.   Neurological: Negative for dizziness, sensory change, loss of consciousness and headaches.  Psychiatric/Behavioral: Negative for depression. The patient is nervous/anxious. The patient does not have insomnia.       Physical Exam  BP 104/62 mmHg  Pulse 72  Temp(Src) 98.2 F (36.8 C) (Temporal)  Resp 16  Ht 4' 11.5" (1.511 m)  Wt 101 lb (45.813 kg)  BMI 20.07 kg/m2  General Appearance: Well nourished and in no apparent distress. Eyes: PERRLA, EOMs, conjunctiva no swelling or erythema, normal fundi and vessels. Sinuses: No frontal/maxillary tenderness ENT/Mouth: EACs patent / TMs  nl. Nares clear without erythema, swelling, mucoid exudates. Oral hygiene is good. No erythema, swelling, or exudate. Tongue normal, non-obstructing. Tonsils not  swollen or erythematous. Hearing normal.  Neck: Supple, thyroid normal. No bruits, nodes or JVD. Respiratory: Respiratory effort normal.  BS equal and clear bilateral without rales, rhonci, wheezing or stridor. Cardio: Heart sounds are normal with regular rate and rhythm and no murmurs, rubs or gallops. Peripheral pulses are normal and equal bilaterally without edema. No aortic or femoral bruits. Chest: symmetric with normal excursions and percussion. Breasts: Symmetric, without lumps, nipple discharge, retractions, or fibrocystic changes.  Abdomen: Flat, soft, with bowl sounds. Nontender, no guarding, rebound, hernias, masses, or organomegaly.  Lymphatics: Non tender without lymphadenopathy.  Genitourinary: some atrophic vaginal changes.  Cervix surgically absent.  Vaginal tissue without abnormalities. Musculoskeletal: Full ROM all peripheral extremities, joint stability, 5/5 strength, and normal gait. Skin: Warm and dry without rashes, lesions, cyanosis, clubbing or  ecchymosis.  Neuro: Cranial nerves intact, reflexes equal bilaterally. Normal muscle tone, no cerebellar symptoms. Sensation intact.  Pysch: Awake and oriented X 3, normal affect, Insight and Judgment appropriate.   Assessment and Plan    1. Hyperlipidemia  - Lipid panel  2. TOBACCO ABUSE  - EKG 12-Lead - [place], RETROPERITNL ABD,  LTD - DG Chest 2 View; Future  3. Medication management  - CBC with Differential/Platelet - BASIC METABOLIC PANEL WITH GFR - Hepatic function panel - Magnesium  4. Vitamin D deficiency -start supplement - Vit D  25 hydroxy (rtn osteoporosis monitoring)  5. Screening for rectal cancer  - POC Hemoccult Bld/Stl (3-Cd Home Screen); Future  6. Screening for diabetes mellitus  - Hemoglobin A1c - Insulin, random  7. Screening for cervical cancer  - Cytology -Pap Smear  8. Post-menopausal  - DG Bone Density; Future  9. Screening for hypothyroidism  - TSH  10. Fibrocystic  breast, unspecified laterality  - MM Digital Screening; Future  11. Screening for deficiency anemia  - Iron and TIBC - Vitamin B12  12. Screening for blood or protein in urine  - Urinalysis, Routine w reflex microscopic (not at [facility]) - Microalbumin / creatinine urine ratio     Continue prudent diet as discussed, weight control, regular exercise, and medications. Routine screening labs and tests as requested with regular follow-up as recommended.  Over 40 minutes of exam, counseling, chart review and critical decision making was performed

## 2014-10-17 NOTE — Patient Instructions (Signed)
Preventive Care for Adults  A healthy lifestyle and preventive care can promote health and wellness. Preventive health guidelines for women include the following key practices.  A routine yearly physical is a good way to check with your health care provider about your health and preventive screening. It is a chance to share any concerns and updates on your health and to receive a thorough exam.  Visit your dentist for a routine exam and preventive care every 6 months. Brush your teeth twice a day and floss once a day. Good oral hygiene prevents tooth decay and gum disease.  The frequency of eye exams is based on your age, health, family medical history, use of contact lenses, and other factors. Follow your health care provider's recommendations for frequency of eye exams.  Eat a healthy diet. Foods like vegetables, fruits, whole grains, low-fat dairy products, and lean protein foods contain the nutrients you need without too many calories. Decrease your intake of foods high in solid fats, added sugars, and salt. Eat the right amount of calories for you.Get information about a proper diet from your health care provider, if necessary.  Regular physical exercise is one of the most important things you can do for your health. Most adults should get at least 150 minutes of moderate-intensity exercise (any activity that increases your heart rate and causes you to sweat) each week. In addition, most adults need muscle-strengthening exercises on 2 or more days a week.  Maintain a healthy weight. The body mass index (BMI) is a screening tool to identify possible weight problems. It provides an estimate of body fat based on height and weight. Your health care provider can find your BMI and can help you achieve or maintain a healthy weight.For adults 20 years and older:  A BMI below 18.5 is considered underweight.  A BMI of 18.5 to 24.9 is normal.  A BMI of 25 to 29.9 is considered overweight.  A BMI  of 30 and above is considered obese.  Maintain normal blood lipids and cholesterol levels by exercising and minimizing your intake of saturated fat. Eat a balanced diet with plenty of fruit and vegetables. Blood tests for lipids and cholesterol should begin at age 58 and be repeated every 5 years. If your lipid or cholesterol levels are high, you are over 50, or you are at high risk for heart disease, you may need your cholesterol levels checked more frequently.Ongoing high lipid and cholesterol levels should be treated with medicines if diet and exercise are not working.  If you smoke, find out from your health care provider how to quit. If you do not use tobacco, do not start.  Lung cancer screening is recommended for adults aged 58-80 years who are at high risk for developing lung cancer because of a history of smoking. A yearly low-dose CT scan of the lungs is recommended for people who have at least a 30-pack-year history of smoking and are a current smoker or have quit within the past 15 years. A pack year of smoking is smoking an average of 1 pack of cigarettes a day for 1 year (for example: 1 pack a day for 30 years or 2 packs a day for 15 years). Yearly screening should continue until the smoker has stopped smoking for at least 15 years. Yearly screening should be stopped for people who develop a health problem that would prevent them from having lung cancer treatment.  High blood pressure causes heart disease and increases the risk of  stroke. Your blood pressure should be checked at least every 1 to 2 years. Ongoing high blood pressure should be treated with medicines if weight loss and exercise do not work.  If you are 55-79 years old, ask your health care provider if you should take aspirin to prevent strokes.  Diabetes screening involves taking a blood sample to check your fasting blood sugar level. This should be done once every 3 years, after age 45, if you are within normal weight and  without risk factors for diabetes. Testing should be considered at a younger age or be carried out more frequently if you are overweight and have at least 1 risk factor for diabetes.  Breast cancer screening is essential preventive care for women. You should practice "breast self-awareness." This means understanding the normal appearance and feel of your breasts and may include breast self-examination. Any changes detected, no matter how small, should be reported to a health care provider. Women in their 20s and 30s should have a clinical breast exam (CBE) by a health care provider as part of a regular health exam every 1 to 3 years. After age 40, women should have a CBE every year. Starting at age 40, women should consider having a mammogram (breast X-ray test) every year. Women who have a family history of breast cancer should talk to their health care provider about genetic screening. Women at a high risk of breast cancer should talk to their health care providers about having an MRI and a mammogram every year.  Breast cancer gene (BRCA)-related cancer risk assessment is recommended for women who have family members with BRCA-related cancers. BRCA-related cancers include breast, ovarian, tubal, and peritoneal cancers. Having family members with these cancers may be associated with an increased risk for harmful changes (mutations) in the breast cancer genes BRCA1 and BRCA2. Results of the assessment will determine the need for genetic counseling and BRCA1 and BRCA2 testing.  Routine pelvic exams to screen for cancer are no longer recommended for nonpregnant women who are considered low risk for cancer of the pelvic organs (ovaries, uterus, and vagina) and who do not have symptoms. Ask your health care provider if a screening pelvic exam is right for you.  If you have had past treatment for cervical cancer or a condition that could lead to cancer, you need Pap tests and screening for cancer for at least 20  years after your treatment. If Pap tests have been discontinued, your risk factors (such as having a new sexual partner) need to be reassessed to determine if screening should be resumed. Some women have medical problems that increase the chance of getting cervical cancer. In these cases, your health care provider may recommend more frequent screening and Pap tests.  Colorectal cancer can be detected and often prevented. Most routine colorectal cancer screening begins at the age of 50 years and continues through age 75 years. However, your health care provider may recommend screening at an earlier age if you have risk factors for colon cancer. On a yearly basis, your health care provider may provide home test kits to check for hidden blood in the stool. Use of a small camera at the end of a tube, to directly examine the colon (sigmoidoscopy or colonoscopy), can detect the earliest forms of colorectal cancer. Talk to your health care provider about this at age 50, when routine screening begins. Direct exam of the colon should be repeated every 5-10 years through age 75 years, unless early forms of pre-cancerous   polyps or small growths are found.  Hepatitis C blood testing is recommended for all people born from 1945 through 1965 and any individual with known risks for hepatitis C.  Pra  Osteoporosis is a disease in which the bones lose minerals and strength with aging. This can result in serious bone fractures or breaks. The risk of osteoporosis can be identified using a bone density scan. Women ages 65 years and over and women at risk for fractures or osteoporosis should discuss screening with their health care providers. Ask your health care provider whether you should take a calcium supplement or vitamin D to reduce the rate of osteoporosis.  Menopause can be associated with physical symptoms and risks. Hormone replacement therapy is available to decrease symptoms and risks. You should talk to your  health care provider about whether hormone replacement therapy is right for you.  Use sunscreen. Apply sunscreen liberally and repeatedly throughout the day. You should seek shade when your shadow is shorter than you. Protect yourself by wearing long sleeves, pants, a wide-brimmed hat, and sunglasses year round, whenever you are outdoors.  Once a month, do a whole body skin exam, using a mirror to look at the skin on your back. Tell your health care provider of new moles, moles that have irregular borders, moles that are larger than a pencil eraser, or moles that have changed in shape or color.  Stay current with required vaccines (immunizations).  Influenza vaccine. All adults should be immunized every year.  Tetanus, diphtheria, and acellular pertussis (Td, Tdap) vaccine. Pregnant women should receive 1 dose of Tdap vaccine during each pregnancy. The dose should be obtained regardless of the length of time since the last dose. Immunization is preferred during the 27th-36th week of gestation. An adult who has not previously received Tdap or who does not know her vaccine status should receive 1 dose of Tdap. This initial dose should be followed by tetanus and diphtheria toxoids (Td) booster doses every 10 years. Adults with an unknown or incomplete history of completing a 3-dose immunization series with Td-containing vaccines should begin or complete a primary immunization series including a Tdap dose. Adults should receive a Td booster every 10 years.  Varicella vaccine. An adult without evidence of immunity to varicella should receive 2 doses or a second dose if she has previously received 1 dose. Pregnant females who do not have evidence of immunity should receive the first dose after pregnancy. This first dose should be obtained before leaving the health care facility. The second dose should be obtained 4-8 weeks after the first dose.  Human papillomavirus (HPV) vaccine. Females aged 13-26 years  who have not received the vaccine previously should obtain the 3-dose series. The vaccine is not recommended for use in pregnant females. However, pregnancy testing is not needed before receiving a dose. If a female is found to be pregnant after receiving a dose, no treatment is needed. In that case, the remaining doses should be delayed until after the pregnancy. Immunization is recommended for any person with an immunocompromised condition through the age of 26 years if she did not get any or all doses earlier. During the 3-dose series, the second dose should be obtained 4-8 weeks after the first dose. The third dose should be obtained 24 weeks after the first dose and 16 weeks after the second dose.  Zoster vaccine. One dose is recommended for adults aged 60 years or older unless certain conditions are present.  Measles, mumps, and rubella (  MMR) vaccine. Adults born before [birth year] generally are considered immune to measles and mumps. Adults born in [birth year] or later should have 1 or more doses of MMR vaccine unless there is a contraindication to the vaccine or there is laboratory evidence of immunity to each of the three diseases. A routine second dose of MMR vaccine should be obtained at least 28 days after the first dose for students attending postsecondary schools, health care workers, or international travelers. People who received inactivated measles vaccine or an unknown type of measles vaccine during 1963-1967 should receive 2 doses of MMR vaccine. People who received inactivated mumps vaccine or an unknown type of mumps vaccine before 1979 and are at high risk for mumps infection should consider immunization with 2 doses of MMR vaccine. For females of childbearing age, rubella immunity should be determined. If there is no evidence of immunity, females who are not pregnant should be vaccinated. If there is no evidence of immunity, females who are pregnant should delay immunization until after pregnancy.  Unvaccinated health care workers born before [birth year] who lack laboratory evidence of measles, mumps, or rubella immunity or laboratory confirmation of disease should consider measles and mumps immunization with 2 doses of MMR vaccine or rubella immunization with 1 dose of MMR vaccine.  Pneumococcal 13-valent conjugate (PCV13) vaccine. When indicated, a person who is uncertain of her immunization history and has no record of immunization should receive the PCV13 vaccine. An adult aged 39 years or older who has certain medical conditions and has not been previously immunized should receive 1 dose of PCV13 vaccine. This PCV13 should be followed with a dose of pneumococcal polysaccharide (PPSV23) vaccine. The PPSV23 vaccine dose should be obtained at least 8 weeks after the dose of PCV13 vaccine. An adult aged 62 years or older who has certain medical conditions and previously received 1 or more doses of PPSV23 vaccine should receive 1 dose of PCV13. The PCV13 vaccine dose should be obtained 1 or more years after the last PPSV23 vaccine dose.    Pneumococcal polysaccharide (PPSV23) vaccine. When PCV13 is also indicated, PCV13 should be obtained first. All adults aged 67 years and older should be immunized. An adult younger than age 45 years who has certain medical conditions should be immunized. Any person who resides in a nursing home or long-term care facility should be immunized. An adult smoker should be immunized. People with an immunocompromised condition and certain other conditions should receive both PCV13 and PPSV23 vaccines. People with human immunodeficiency virus (HIV) infection should be immunized as soon as possible after diagnosis. Immunization during chemotherapy or radiation therapy should be avoided. Routine use of PPSV23 vaccine is not recommended for American Indians, [place] Natives, or people younger than 65 years unless there are medical conditions that require PPSV23 vaccine. When indicated,  people who have unknown immunization and have no record of immunization should receive PPSV23 vaccine. One-time revaccination 5 years after the first dose of PPSV23 is recommended for people aged 19-64 years who have chronic kidney failure, nephrotic syndrome, asplenia, or immunocompromised conditions. People who received 1-2 doses of PPSV23 before age 23 years should receive another dose of PPSV23 vaccine at age 35 years or later if at least 5 years have passed since the previous dose. Doses of PPSV23 are not needed for people immunized with PPSV23 at or after age 38 years.  Preventive Services / Frequency   Ages 43 to 86 years  Blood pressure check.  Lipid and cholesterol check.  Lung  cancer screening. / Every year if you are aged 55-80 years and have a 30-pack-year history of smoking and currently smoke or have quit within the past 15 years. Yearly screening is stopped once you have quit smoking for at least 15 years or develop a health problem that would prevent you from having lung cancer treatment.  Clinical breast exam.** / Every year after age 40 years.  BRCA-related cancer risk assessment.** / For women who have family members with a BRCA-related cancer (breast, ovarian, tubal, or peritoneal cancers).  Mammogram.** / Every year beginning at age 40 years and continuing for as long as you are in good health. Consult with your health care provider.  Pap test.** / Every 3 years starting at age 30 years through age 65 or 70 years with a history of 3 consecutive normal Pap tests.  HPV screening.** / Every 3 years from ages 30 years through ages 65 to 70 years with a history of 3 consecutive normal Pap tests.  Fecal occult blood test (FOBT) of stool. / Every year beginning at age 50 years and continuing until age 75 years. You may not need to do this test if you get a colonoscopy every 10 years.  Flexible sigmoidoscopy or colonoscopy.** / Every 5 years for a flexible sigmoidoscopy or  every 10 years for a colonoscopy beginning at age 50 years and continuing until age 75 years.  Hepatitis C blood test.** / For all people born from 1945 through 1965 and any individual with known risks for hepatitis C.  Skin self-exam. / Monthly.  Influenza vaccine. / Every year.  Tetanus, diphtheria, and acellular pertussis (Tdap/Td) vaccine.** / Consult your health care provider. Pregnant women should receive 1 dose of Tdap vaccine during each pregnancy. 1 dose of Td every 10 years.  Varicella vaccine.** / Consult your health care provider. Pregnant females who do not have evidence of immunity should receive the first dose after pregnancy.  Zoster vaccine.** / 1 dose for adults aged 60 years or older.  Pneumococcal 13-valent conjugate (PCV13) vaccine.** / Consult your health care provider.  Pneumococcal polysaccharide (PPSV23) vaccine.** / 1 to 2 doses if you smoke cigarettes or if you have certain conditions.  Meningococcal vaccine.** / Consult your health care provider.  Hepatitis A vaccine.** / Consult your health care provider.  Hepatitis B vaccine.** / Consult your health care provider. Screening for abdominal aortic aneurysm (AAA)  by ultrasound is recommended for people over 50 who have history of high blood pressure or who are current or former smokers.   

## 2014-10-18 LAB — URINALYSIS, ROUTINE W REFLEX MICROSCOPIC
BILIRUBIN URINE: NEGATIVE
Glucose, UA: NEGATIVE mg/dL
Hgb urine dipstick: NEGATIVE
KETONES UR: NEGATIVE mg/dL
Leukocytes, UA: NEGATIVE
Nitrite: NEGATIVE
PROTEIN: NEGATIVE mg/dL
Urobilinogen, UA: 0.2 mg/dL (ref 0.0–1.0)
pH: 6.5 (ref 5.0–8.0)

## 2014-10-18 LAB — MICROALBUMIN / CREATININE URINE RATIO: CREATININE, URINE: 17.2 mg/dL

## 2014-10-18 LAB — INSULIN, RANDOM: INSULIN: 2.7 u[IU]/mL (ref 2.0–19.6)

## 2014-10-18 LAB — HEMOGLOBIN A1C
Hgb A1c MFr Bld: 5.5 % (ref <5.7)
Mean Plasma Glucose: 111 mg/dL (ref <117)

## 2014-10-18 LAB — VITAMIN D 25 HYDROXY (VIT D DEFICIENCY, FRACTURES): Vit D, 25-Hydroxy: 40 ng/mL (ref 30–100)

## 2014-10-19 LAB — CYTOLOGY - PAP

## 2014-11-14 ENCOUNTER — Other Ambulatory Visit (INDEPENDENT_AMBULATORY_CARE_PROVIDER_SITE_OTHER): Payer: No Typology Code available for payment source

## 2014-11-14 DIAGNOSIS — Z1212 Encounter for screening for malignant neoplasm of rectum: Secondary | ICD-10-CM

## 2014-11-14 LAB — POC HEMOCCULT BLD/STL (HOME/3-CARD/SCREEN)
Card #2 Fecal Occult Blod, POC: NEGATIVE
Card #3 Fecal Occult Blood, POC: NEGATIVE
Fecal Occult Blood, POC: NEGATIVE

## 2015-04-20 ENCOUNTER — Ambulatory Visit (INDEPENDENT_AMBULATORY_CARE_PROVIDER_SITE_OTHER): Payer: No Typology Code available for payment source | Admitting: Internal Medicine

## 2015-04-20 ENCOUNTER — Encounter: Payer: Self-pay | Admitting: Internal Medicine

## 2015-04-20 VITALS — BP 112/64 | HR 94 | Temp 98.2°F | Resp 16 | Ht 59.5 in | Wt 101.8 lb

## 2015-04-20 DIAGNOSIS — E785 Hyperlipidemia, unspecified: Secondary | ICD-10-CM

## 2015-04-20 DIAGNOSIS — F17200 Nicotine dependence, unspecified, uncomplicated: Secondary | ICD-10-CM | POA: Diagnosis not present

## 2015-04-20 DIAGNOSIS — Z79899 Other long term (current) drug therapy: Secondary | ICD-10-CM

## 2015-04-20 LAB — CBC WITH DIFFERENTIAL/PLATELET
BASOS ABS: 0.1 10*3/uL (ref 0.0–0.1)
Basophils Relative: 2 % — ABNORMAL HIGH (ref 0–1)
Eosinophils Absolute: 0.1 10*3/uL (ref 0.0–0.7)
Eosinophils Relative: 2 % (ref 0–5)
HCT: 42.7 % (ref 36.0–46.0)
Hemoglobin: 14.2 g/dL (ref 12.0–15.0)
Lymphocytes Relative: 25 % (ref 12–46)
Lymphs Abs: 1.8 10*3/uL (ref 0.7–4.0)
MCH: 30.9 pg (ref 26.0–34.0)
MCHC: 33.3 g/dL (ref 30.0–36.0)
MCV: 93 fL (ref 78.0–100.0)
MPV: 10.2 fL (ref 8.6–12.4)
Monocytes Absolute: 0.6 10*3/uL (ref 0.1–1.0)
Monocytes Relative: 9 % (ref 3–12)
NEUTROS ABS: 4.5 10*3/uL (ref 1.7–7.7)
NEUTROS PCT: 62 % (ref 43–77)
Platelets: 323 10*3/uL (ref 150–400)
RBC: 4.59 MIL/uL (ref 3.87–5.11)
RDW: 12.8 % (ref 11.5–15.5)
WBC: 7.2 10*3/uL (ref 4.0–10.5)

## 2015-04-20 LAB — LIPID PANEL
Cholesterol: 220 mg/dL — ABNORMAL HIGH (ref 125–200)
HDL: 75 mg/dL (ref >=46)
LDL CALC: 124 mg/dL (ref <130)
Total CHOL/HDL Ratio: 2.9 Ratio (ref <=5.0)
Triglycerides: 103 mg/dL (ref <150)
VLDL: 21 mg/dL (ref <30)

## 2015-04-20 LAB — BASIC METABOLIC PANEL WITH GFR
BUN: 12 mg/dL (ref 7–25)
CO2: 23 mmol/L (ref 20–31)
Calcium: 9.3 mg/dL (ref 8.6–10.4)
Chloride: 107 mmol/L (ref 98–110)
Creat: 0.63 mg/dL (ref 0.50–0.99)
GLUCOSE: 91 mg/dL (ref 65–99)
POTASSIUM: 4.4 mmol/L (ref 3.5–5.3)
Sodium: 141 mmol/L (ref 135–146)

## 2015-04-20 LAB — HEPATIC FUNCTION PANEL
ALK PHOS: 61 U/L (ref 33–130)
ALT: 14 U/L (ref 6–29)
AST: 17 U/L (ref 10–35)
Albumin: 4.2 g/dL (ref 3.6–5.1)
BILIRUBIN INDIRECT: 0.3 mg/dL (ref 0.2–1.2)
Bilirubin, Direct: 0.1 mg/dL (ref <=0.2)
Total Bilirubin: 0.4 mg/dL (ref 0.2–1.2)
Total Protein: 6.9 g/dL (ref 6.1–8.1)

## 2015-04-20 LAB — MAGNESIUM: MAGNESIUM: 2.1 mg/dL (ref 1.5–2.5)

## 2015-04-20 MED ORDER — PREDNISONE 20 MG PO TABS: ORAL_TABLET | ORAL | Status: DC

## 2015-04-20 NOTE — Patient Instructions (Signed)
Please use claritin, zyrtec, or allegra daily until the congestion resolves. Store brand is okay.  Buy whatever is on sale.  You can use mucinex every 12 hours.  Please take prednisone until it is gone.  Buy flonase, nasacort, or rhinocort and use 2 sprays per nostril right before bedtime.  Spray to the crown of your head or out towards your ears.    Use nasal saline as often as you can tolerate.

## 2015-04-20 NOTE — Progress Notes (Signed)
Patient ID: [name], female   DOB: 05-16-1951, 64 y.o.   MRN: [id number]  Assessment and Plan:  Hypertension:  -Continue medication,  -monitor blood pressure at home.  -Continue DASH diet.   -Reminder to go to the ER if any CP, SOB, nausea, dizziness, severe HA, changes vision/speech, left arm numbness and tingling, and jaw pain.  Cholesterol: -Continue diet and exercise.  -Check cholesterol.   Pre-diabetes: -Continue diet and exercise.  -Check A1C  Vitamin D Def: -check level -continue medications.   Nasal Congestion -prednisone -flonase -antihistamine -nasal saline  Continue diet and meds as discussed. Further disposition pending results of labs.  HPI 64 y.o. female  presents for 3 month follow up with hypertension, hyperlipidemia, prediabetes and vitamin D.   Her blood pressure has been controlled at home, today their BP is BP: 112/64 mmHg.   She does not formally workout. She denies chest pain, shortness of breath, dizziness.  She does try to walk at lunch time. She is trying to gain weight currently.   She is not on cholesterol medication and denies myalgias. Her cholesterol is not at goal. The cholesterol last visit was:   Lab Results  Component Value Date   CHOL 236* 10/17/2014   HDL 92 10/17/2014   LDLCALC 131* 10/17/2014   LDLDIRECT 166.3 03/05/2011   TRIG 67 10/17/2014   CHOLHDL 2.6 10/17/2014     She has been working on diet and exercise for prediabetes, and denies foot ulcerations, hyperglycemia, hypoglycemia , increased appetite, nausea, paresthesia of the feet, polydipsia, polyuria, visual disturbances, vomiting and weight loss. Last A1C in the office was:  Lab Results  Component Value Date   HGBA1C 5.5 10/17/2014    Patient is on Vitamin D supplement.  Lab Results  Component Value Date   VD25OH 40 10/17/2014      Current Medications:  Current Outpatient Prescriptions on File Prior to Visit  Medication Sig Dispense Refill  . Multiple  Vitamins-Minerals (ONE-A-DAY WOMENS 50+ ADVANTAGE PO) Take by mouth daily.     No current facility-administered medications on file prior to visit.    Medical History:  Past Medical History  Diagnosis Date  . Hyperlipidemia   . Kidney stones   . Arthritis   . History of diverticulitis of colon   . Hemorrhoids     Allergies: No Known Allergies   Review of Systems:  Review of Systems  Constitutional: Positive for malaise/fatigue. Negative for fever and chills.  HENT: Positive for congestion. Negative for ear pain and sore throat.   Eyes: Negative.   Respiratory: Negative for cough, shortness of breath and wheezing.   Cardiovascular: Negative for chest pain, palpitations and leg swelling.  Gastrointestinal: Negative for heartburn, diarrhea, constipation, blood in stool and melena.  Genitourinary: Negative.   Neurological: Positive for headaches. Negative for dizziness and loss of consciousness.  Psychiatric/Behavioral: Negative for depression. The patient is not nervous/anxious and does not have insomnia.     Family history- Review and unchanged  Social history- Review and unchanged  Physical Exam: BP 112/64 mmHg  Pulse 94  Temp(Src) 98.2 F (36.8 C)  Resp 16  Ht 4' 11.5" (1.511 m)  Wt 101 lb 12.8 oz (46.176 kg)  BMI 20.22 kg/m2 Wt Readings from Last 3 Encounters:  04/20/15 101 lb 12.8 oz (46.176 kg)  10/17/14 101 lb (45.813 kg)  09/02/13 117 lb 9.6 oz (53.343 kg)    General Appearance: Well nourished well developed, in no apparent distress. Eyes: PERRLA, EOMs, conjunctiva  no swelling or erythema ENT/Mouth: Ear canals normal without obstruction, swelling, erythma, discharge.  TMs normal bilaterally.  Oropharynx moist, clear, without exudate, or postoropharyngeal swelling. Neck: Supple, thyroid normal,no cervical adenopathy  Respiratory: Respiratory effort normal, Breath sounds clear A&P without rhonchi, wheeze, or rale.  No retractions, no accessory usage. Cardio:  RRR with no MRGs. Brisk peripheral pulses without edema.  Abdomen: Soft, + BS,  Non tender, no guarding, rebound, hernias, masses. Musculoskeletal: Full ROM, 5/5 strength, Normal gait Skin: Warm, dry without rashes, lesions, ecchymosis.  Neuro: Awake and oriented X 3, Cranial nerves intact. Normal muscle tone, no cerebellar symptoms. Psych: Normal affect, Insight and Judgment appropriate.    Starlyn Skeans, PA-C 9:09 AM [facility]

## 2015-04-26 ENCOUNTER — Encounter: Payer: Self-pay | Admitting: Internal Medicine

## 2015-04-30 ENCOUNTER — Ambulatory Visit (INDEPENDENT_AMBULATORY_CARE_PROVIDER_SITE_OTHER): Payer: No Typology Code available for payment source | Admitting: *Deleted

## 2015-04-30 DIAGNOSIS — Z23 Encounter for immunization: Secondary | ICD-10-CM | POA: Diagnosis not present

## 2015-10-01 ENCOUNTER — Other Ambulatory Visit: Payer: Self-pay | Admitting: Internal Medicine

## 2015-10-01 DIAGNOSIS — Z1231 Encounter for screening mammogram for malignant neoplasm of breast: Secondary | ICD-10-CM

## 2015-10-12 ENCOUNTER — Ambulatory Visit: Payer: PRIVATE HEALTH INSURANCE

## 2015-10-22 ENCOUNTER — Encounter: Payer: Self-pay | Admitting: Internal Medicine

## 2015-10-26 ENCOUNTER — Ambulatory Visit
Admission: RE | Admit: 2015-10-26 | Discharge: 2015-10-26 | Disposition: A | Discharge status: Home / Self Care | Payer: PRIVATE HEALTH INSURANCE | Source: Ambulatory Visit | Attending: Internal Medicine | Admitting: Internal Medicine

## 2015-10-26 DIAGNOSIS — Z1231 Encounter for screening mammogram for malignant neoplasm of breast: Secondary | ICD-10-CM

## 2015-11-09 ENCOUNTER — Ambulatory Visit (INDEPENDENT_AMBULATORY_CARE_PROVIDER_SITE_OTHER): Payer: No Typology Code available for payment source | Admitting: Internal Medicine

## 2015-11-09 ENCOUNTER — Encounter: Payer: Self-pay | Admitting: Internal Medicine

## 2015-11-09 VITALS — BP 112/66 | HR 70 | Temp 98.0°F | Resp 18 | Ht 59.0 in | Wt 105.0 lb

## 2015-11-09 DIAGNOSIS — F17200 Nicotine dependence, unspecified, uncomplicated: Secondary | ICD-10-CM | POA: Diagnosis not present

## 2015-11-09 DIAGNOSIS — Z136 Encounter for screening for cardiovascular disorders: Secondary | ICD-10-CM | POA: Diagnosis not present

## 2015-11-09 DIAGNOSIS — Z139 Encounter for screening, unspecified: Secondary | ICD-10-CM | POA: Diagnosis not present

## 2015-11-09 DIAGNOSIS — E559 Vitamin D deficiency, unspecified: Secondary | ICD-10-CM

## 2015-11-09 DIAGNOSIS — Z131 Encounter for screening for diabetes mellitus: Secondary | ICD-10-CM

## 2015-11-09 DIAGNOSIS — Z1389 Encounter for screening for other disorder: Secondary | ICD-10-CM

## 2015-11-09 DIAGNOSIS — Z0000 Encounter for general adult medical examination without abnormal findings: Secondary | ICD-10-CM | POA: Diagnosis not present

## 2015-11-09 DIAGNOSIS — Z1212 Encounter for screening for malignant neoplasm of rectum: Secondary | ICD-10-CM

## 2015-11-09 DIAGNOSIS — H6593 Unspecified nonsuppurative otitis media, bilateral: Secondary | ICD-10-CM

## 2015-11-09 DIAGNOSIS — Z13220 Encounter for screening for lipoid disorders: Secondary | ICD-10-CM

## 2015-11-09 DIAGNOSIS — Z1329 Encounter for screening for other suspected endocrine disorder: Secondary | ICD-10-CM

## 2015-11-09 DIAGNOSIS — Z130 Encounter for screening for diseases of the blood and blood-forming organs and certain disorders involving the immune mechanism: Secondary | ICD-10-CM

## 2015-11-09 LAB — LIPID PANEL
Cholesterol: 261 mg/dL — ABNORMAL HIGH (ref 125–200)
HDL: 102 mg/dL (ref >=46)
LDL CALC: 144 mg/dL — AB (ref <130)
TRIGLYCERIDES: 76 mg/dL (ref <150)
Total CHOL/HDL Ratio: 2.6 Ratio (ref <=5.0)
VLDL: 15 mg/dL (ref <30)

## 2015-11-09 LAB — CBC WITH DIFFERENTIAL/PLATELET
BASOS ABS: 110 {cells}/uL (ref 0–200)
Basophils Relative: 2 %
EOS PCT: 2 %
Eosinophils Absolute: 110 cells/uL (ref 15–500)
HCT: 42.1 % (ref 35.0–45.0)
HEMOGLOBIN: 13.6 g/dL (ref 11.7–15.5)
LYMPHS ABS: 1705 {cells}/uL (ref 850–3900)
LYMPHS PCT: 31 %
MCH: 30.7 pg (ref 27.0–33.0)
MCHC: 32.3 g/dL (ref 32.0–36.0)
MCV: 95 fL (ref 80.0–100.0)
MONOS PCT: 8 %
MPV: 10 fL (ref 7.5–12.5)
Monocytes Absolute: 440 cells/uL (ref 200–950)
NEUTROS PCT: 57 %
Neutro Abs: 3135 cells/uL (ref 1500–7800)
Platelets: 267 10*3/uL (ref 140–400)
RBC: 4.43 MIL/uL (ref 3.80–5.10)
RDW: 13.3 % (ref 11.0–15.0)
WBC: 5.5 10*3/uL (ref 3.8–10.8)

## 2015-11-09 LAB — TSH: TSH: 3.42 mIU/L

## 2015-11-09 LAB — HEPATIC FUNCTION PANEL
ALK PHOS: 56 U/L (ref 33–130)
ALT: 20 U/L (ref 6–29)
AST: 22 U/L (ref 10–35)
Albumin: 4.4 g/dL (ref 3.6–5.1)
Bilirubin, Direct: 0.1 mg/dL (ref <=0.2)
Indirect Bilirubin: 0.4 mg/dL (ref 0.2–1.2)
TOTAL PROTEIN: 6.7 g/dL (ref 6.1–8.1)
Total Bilirubin: 0.5 mg/dL (ref 0.2–1.2)

## 2015-11-09 LAB — BASIC METABOLIC PANEL WITH GFR
BUN: 14 mg/dL (ref 7–25)
CHLORIDE: 108 mmol/L (ref 98–110)
CO2: 25 mmol/L (ref 20–31)
Calcium: 9.3 mg/dL (ref 8.6–10.4)
Creat: 0.68 mg/dL (ref 0.50–0.99)
GFR, Est African American: >89 mL/min (ref >=60)
GFR, Est Non African American: >89 mL/min (ref >=60)
GLUCOSE: 82 mg/dL (ref 65–99)
POTASSIUM: 4.8 mmol/L (ref 3.5–5.3)
Sodium: 146 mmol/L (ref 135–146)

## 2015-11-09 LAB — IRON AND TIBC
%SAT: 38 % (ref 11–50)
Iron: 117 ug/dL (ref 45–160)
TIBC: 312 ug/dL (ref 250–450)
UIBC: 195 ug/dL (ref 125–400)

## 2015-11-09 LAB — MAGNESIUM: Magnesium: 2.3 mg/dL (ref 1.5–2.5)

## 2015-11-09 LAB — VITAMIN B12: VITAMIN B 12: 524 pg/mL (ref 200–1100)

## 2015-11-09 MED ORDER — PREDNISONE 20 MG PO TABS: ORAL_TABLET | ORAL | Status: DC

## 2015-11-09 NOTE — Progress Notes (Signed)
Complete Physical  Assessment and Plan:   1. Routine general medical examination at a health care facility  - CBC with Differential/Platelet - BASIC METABOLIC PANEL WITH GFR - Hepatic function panel - Magnesium  2. Screening for diabetes mellitus  - Hemoglobin A1c - Insulin, random  3. Screening for hyperlipidemia  - Lipid panel  4. Screening for thyroid disorder  - TSH  5. Screening for deficiency anemia  - Iron and TIBC - Vitamin B12  6. Screening for hematuria or proteinuria  - Urinalysis, Routine w reflex microscopic (not at [facility]) - Microalbumin / creatinine urine ratio  7. Screening for cardiovascular condition  - EKG 12-Lead - [place], RETROPERITNL ABD,  LTD  8. Vitamin D deficiency  - VITAMIN D 25 Hydroxy (Vit-D Deficiency, Fractures)  9. Screening for rectal cancer  - POC Hemoccult Bld/Stl (3-Cd Home Screen); Future  10. Middle ear effusion, bilateral -get hearing tested at [facility] or [facility] for free -recommended 1 week of sudafed -cont daily use of antihistamine -flonase - predniSONE (DELTASONE) 20 MG tablet; 3 tabs po day one, then 2 tabs daily x 4 days  Dispense: 11 tablet; Refill: 0  11. TOBACCO ABUSE -discussed smoking cessation, patient not currently interested in smoking cessation    Discussed med's effects and SE's. Screening labs and tests as requested with regular follow-up as recommended.  HPI  65 y.o. female  presents for a complete physical.  Her blood pressure has been controlled at home, today their BP is BP: 112/66 mmHg.  She does workout. She denies chest pain, shortness of breath, dizziness.   She is on cholesterol medication and denies myalgias. Her cholesterol is at goal. The cholesterol last visit was:  Lab Results  Component Value Date   CHOL 220* 04/20/2015   HDL 75 04/20/2015   LDLCALC 124 04/20/2015   LDLDIRECT 166.3 03/05/2011   TRIG 103 04/20/2015   CHOLHDL 2.9 04/20/2015  .  She has been working on diet and  exercise for prediabetes, she is on bASA, she is on ACE/ARB and denies foot ulcerations, hyperglycemia, hypoglycemia , increased appetite, nausea, paresthesia of the feet, polydipsia, polyuria, visual disturbances, vomiting and weight loss. Last A1C in the office was:  Lab Results  Component Value Date   HGBA1C 5.5 10/17/2014    Patient is on Vitamin D supplement.   Lab Results  Component Value Date   VD25OH 40 10/17/2014     She does report that she is working hard trying to eat healthy foods.  She is walking nearly every day for exercise.    She has some ear ringing x several weeks.  She does admit to some nasal congestion and occasional ear pain.  She has no exposure to loud noises.  She does feel like she has to turn the volume up on the TV.  Has not had a formal hearing test in over 20 years.     Current Medications:  Current Outpatient Prescriptions on File Prior to Visit  Medication Sig Dispense Refill  . Multiple Vitamins-Minerals (ONE-A-DAY WOMENS 50+ ADVANTAGE PO) Take by mouth daily.     No current facility-administered medications on file prior to visit.    Health Maintenance:   Immunization History  Administered Date(s) Administered  . Pneumococcal Conjugate-13 04/30/2015  . Td 08/02/1996  . Tdap 03/10/2011    Tetanus:  2012 Prevnar 13:  2016 Pap: 2016 MGM: 10/31/15 DEXA: Declined due to cost Colonoscopy: 2012 Last Eye exam:  [facility], 2016  Patient Care Team: Unk Pinto, MD as PCP - General (Internal Medicine)  Allergies: No Known Allergies  Medical History:  Past Medical History  Diagnosis Date  . Hyperlipidemia   . Kidney stones   . Arthritis   . History of diverticulitis of colon   . Hemorrhoids     Surgical History:  Past Surgical History  Procedure Laterality Date  . Vaginal hysterectomy      total  . Knee surgery  07/2006    right  . Breast cyst excision      X2  . Colonoscopy  nov 2011    Family History:   Family History  Problem Relation Age of Onset  . Atrial fibrillation Mother   . Thyroid disease Mother   . Hypertension Father   . Heart disease Father     heart attack  . Cancer Paternal Uncle     leukemia  . Cancer Maternal Grandmother     leukemia  . Cancer Paternal Grandfather     leukemia    Social History:  Social History  Substance Use Topics  . Smoking status: Current Every Day Smoker -- 30 years    Types: Cigarettes  . Smokeless tobacco: Never Used  . Alcohol Use: No    Review of Systems: Review of Systems  Constitutional: Negative for fever, chills and malaise/fatigue.  HENT: Negative for congestion, ear pain and sore throat.   Eyes: Negative.   Respiratory: Negative for cough, shortness of breath and wheezing.   Cardiovascular: Negative for chest pain, palpitations and leg swelling.  Gastrointestinal: Negative for heartburn, abdominal pain, diarrhea, constipation, blood in stool and melena.  Genitourinary: Negative.   Musculoskeletal: Negative.   Skin: Negative.   Neurological: Negative for dizziness, sensory change, loss of consciousness and headaches.  Psychiatric/Behavioral: Negative for depression. The patient is not nervous/anxious and does not have insomnia.     Physical Exam: Estimated body mass index is 21.2 kg/(m^2) as calculated from the following:   Height as of this encounter: 4\' 11"  (1.499 m).   Weight as of this encounter: 105 lb (47.628 kg). BP 112/66 mmHg  Pulse 70  Temp(Src) 98 F (36.7 C) (Temporal)  Resp 18  Ht 4\' 11"  (1.499 m)  Wt 105 lb (47.628 kg)  BMI 21.20 kg/m2  General Appearance: Well nourished well developed, in no apparent distress.  Eyes: PERRLA, EOMs, conjunctiva no swelling or erythema ENT/Mouth: Ear canals normal without obstruction, swelling, erythema, or discharge.  TMs normal bilaterally with no erythema, bulging, retraction, or loss of landmark.  Oropharynx moist and clear with no exudate, erythema, or swelling.    Neck: Supple, thyroid normal. No bruits.  No cervical adenopathy Respiratory: Respiratory effort normal, Breath sounds clear A&P without wheeze, rhonchi, rales.   Cardio: RRR without murmurs, rubs or gallops. Brisk peripheral pulses without edema.  Chest: symmetric, with normal excursions Breasts: Symmetric, without lumps, nipple discharge, retractions.  Abdomen: Soft, nontender, no guarding, rebound, hernias, masses, or organomegaly.  Lymphatics: Non tender without lymphadenopathy.  Genitourinary: Deferred to Obgyn. Musculoskeletal: Full ROM all peripheral extremities,5/5 strength, and normal gait.  Skin: Warm, dry without rashes, lesions, ecchymosis. Neuro: Awake and oriented X 3, Cranial nerves intact, reflexes equal bilaterally. Normal muscle tone, no cerebellar symptoms. Sensation intact.  Psych:  normal affect, Insight and Judgment appropriate.   EKG: WNL no changes.  AORTA SCAN: WNL   Over 40 minutes of exam, counseling, chart review and critical decision making was performed  Loma Sousa Forcucci 10:07 AM [facility]

## 2015-11-10 LAB — URINALYSIS, ROUTINE W REFLEX MICROSCOPIC
Bilirubin Urine: NEGATIVE
Glucose, UA: NEGATIVE
HGB URINE DIPSTICK: NEGATIVE
Ketones, ur: NEGATIVE
LEUKOCYTES UA: NEGATIVE
NITRITE: NEGATIVE
PROTEIN: NEGATIVE
Specific Gravity, Urine: 1.017 (ref 1.001–1.035)
pH: 5.5 (ref 5.0–8.0)

## 2015-11-10 LAB — INSULIN, RANDOM: Insulin: <1 u[IU]/mL — ABNORMAL LOW (ref 2.0–19.6)

## 2015-11-10 LAB — MICROALBUMIN / CREATININE URINE RATIO
Creatinine, Urine: 86 mg/dL (ref 20–320)
MICROALB UR: 0.4 mg/dL
MICROALB/CREAT RATIO: 5 ug/mg{creat} (ref <30)

## 2015-11-10 LAB — VITAMIN D 25 HYDROXY (VIT D DEFICIENCY, FRACTURES): Vit D, 25-Hydroxy: 38 ng/mL (ref 30–100)

## 2015-11-10 LAB — HEMOGLOBIN A1C
Hgb A1c MFr Bld: 5.3 % (ref <5.7)
Mean Plasma Glucose: 105 mg/dL

## 2016-05-22 ENCOUNTER — Ambulatory Visit (INDEPENDENT_AMBULATORY_CARE_PROVIDER_SITE_OTHER): Payer: No Typology Code available for payment source | Admitting: Internal Medicine

## 2016-05-22 ENCOUNTER — Encounter: Payer: Self-pay | Admitting: Internal Medicine

## 2016-05-22 VITALS — BP 102/60 | HR 86 | Temp 98.2°F | Resp 16 | Ht 59.0 in

## 2016-05-22 DIAGNOSIS — J069 Acute upper respiratory infection, unspecified: Secondary | ICD-10-CM | POA: Diagnosis not present

## 2016-05-22 MED ORDER — PROMETHAZINE-DM 6.25-15 MG/5ML PO SYRP: ORAL_SOLUTION | ORAL | 1 refills | Status: DC

## 2016-05-22 MED ORDER — AZITHROMYCIN 250 MG PO TABS: ORAL_TABLET | ORAL | 0 refills | Status: DC

## 2016-05-22 MED ORDER — AZELASTINE HCL 0.1 % NA SOLN: 2.0000 | Freq: Two times a day (BID) | NASAL | 2 refills | Status: DC

## 2016-05-22 MED ORDER — PREDNISONE 20 MG PO TABS: ORAL_TABLET | ORAL | 0 refills | Status: DC

## 2016-05-22 NOTE — Progress Notes (Signed)
HPI  Patient presents to the office for evaluation of scratchy sore throat and cough.  It has been going on for 1 weeks.  Patient reports night > day, wet cough, worse with lying down.  They also endorse change in voice, postnasal drip and chest congestion, chest pain sore to the touch, scratchy throat, ear congestion, rhinorrhea, and nasal congestion..  They have tried vicks vapor rub, cloracedin, mucinex.  They report that nothing has worked.  They admits to other sick contacts.  Review of Systems  Constitutional: Positive for malaise/fatigue. Negative for chills and fever.  HENT: Positive for congestion, ear pain, hearing loss and sore throat.   Respiratory: Positive for cough. Negative for sputum production, shortness of breath and wheezing.   Cardiovascular: Negative for chest pain, palpitations and leg swelling.  Neurological: Positive for headaches.    PE:  Vitals:   05/22/16 1048  BP: 102/60  Pulse: 86  Resp: 16  Temp: 98.2 F (36.8 C)    General:  Alert and non-toxic, WDWN, NAD HEENT: NCAT, PERLA, EOM normal, no occular discharge or erythema.  Nasal mucosal edema with sinus tenderness to palpation.  Oropharynx clear with minimal oropharyngeal edema and erythema.  Mucous membranes moist and pink. Neck:  Cervical adenopathy Chest:  RRR no MRGs.  Lungs clear to auscultation A&P with no wheezes rhonchi or rales.   Abdomen: +BS x 4 quadrants, soft, non-tender, no guarding, rigidity, or rebound. Skin: warm and dry no rash Neuro: A&Ox4, CN II-XII grossly intact  Assessment and Plan:  Acute uri -likely viral -zpak -cont nasacort -zyrtec -astelin -prednisone -phenergan dm -return if no relief or worsening symptoms.

## 2016-05-22 NOTE — Patient Instructions (Signed)
Please stop the mucinex,  Please take the prednisone with breakfast until it is gone.  Please take the zpak until it is gone.  Please take claritin, zyrtec or allegra daily.  Please continue to do 2 sprays of nasacort in each nostril right before bedtime.  Please take 2 sprays per each nostril of astelin in the monring and in the evening.    Please use saline rinse in your nose as often as you can tolerate.  If you desire you can take benadryl 25-50 mg at bedtime to help you sleep.  Please take the cough syrup 3 times per day as needed for cough and congestion.

## 2016-06-09 ENCOUNTER — Ambulatory Visit (INDEPENDENT_AMBULATORY_CARE_PROVIDER_SITE_OTHER): Payer: No Typology Code available for payment source | Admitting: Internal Medicine

## 2016-06-09 ENCOUNTER — Other Ambulatory Visit: Payer: Self-pay | Admitting: *Deleted

## 2016-06-09 ENCOUNTER — Ambulatory Visit (HOSPITAL_COMMUNITY)
Admission: RE | Admit: 2016-06-09 | Discharge: 2016-06-09 | Disposition: A | Discharge status: Home / Self Care | Payer: PRIVATE HEALTH INSURANCE | Source: Ambulatory Visit | Attending: Internal Medicine | Admitting: Internal Medicine

## 2016-06-09 VITALS — BP 112/64 | HR 88 | Temp 101.4°F | Resp 16 | Ht 59.0 in | Wt 107.6 lb

## 2016-06-09 DIAGNOSIS — J069 Acute upper respiratory infection, unspecified: Secondary | ICD-10-CM

## 2016-06-09 DIAGNOSIS — R059 Cough, unspecified: Secondary | ICD-10-CM

## 2016-06-09 DIAGNOSIS — R05 Cough: Secondary | ICD-10-CM

## 2016-06-09 DIAGNOSIS — J101 Influenza due to other identified influenza virus with other respiratory manifestations: Secondary | ICD-10-CM

## 2016-06-09 MED ORDER — PREDNISONE 20 MG PO TABS: ORAL_TABLET | ORAL | 0 refills | Status: DC

## 2016-06-09 MED ORDER — ALBUTEROL SULFATE HFA 108 (90 BASE) MCG/ACT IN AERS: 2.0000 | INHALATION_SPRAY | Freq: Four times a day (QID) | RESPIRATORY_TRACT | 0 refills | Status: DC | PRN

## 2016-06-09 MED ORDER — AMOXICILLIN-POT CLAVULANATE 875-125 MG PO TABS: 1.0000 | ORAL_TABLET | Freq: Two times a day (BID) | ORAL | 0 refills | Status: DC

## 2016-06-09 NOTE — Patient Instructions (Signed)
Please take the prednisone until it prednisone until it is gone.   Please take the augmentin twice daily to help treat bacterial sinus infection.  Continue your nasal sprays daily.  Please keep taking claritin zyrtec or allegra daily.  Please take sudafed PE every 6 hours as needed for congestion.  Please drink tons of fluids.    Alternate tylenol and ibuprofen every 3 hours to help keep your temperature down.

## 2016-06-09 NOTE — Progress Notes (Signed)
HPI  Patient presents to the office for evaluation of cough, sinus headache, congestion, fevers, and chills.  It has been going on for 1 days.  Patient reports dry, barky, cough.  They also endorse change in voice, chills, fever, postnasal drip, shortness of breath, wheezing and clear congestion with occasional gold sputum production.  .  They have tried cough syrup, aleve, nasal saline, claritin.  They report that nothing has worked.  They admits to other sick contacts.  She does not think she has been exposed to flu.  She does feel like her chest is tight.    Review of Systems  Constitutional: Positive for chills, fever and malaise/fatigue.  HENT: Positive for congestion, ear pain, sinus pain and sore throat.   Respiratory: Positive for cough. Negative for sputum production, shortness of breath and wheezing.   Cardiovascular: Negative for chest pain, palpitations and leg swelling.    PE:  Vitals:   06/09/16 1610  BP: 112/64  Pulse: 88  Resp: 16  Temp: (!) 101.4 F (38.6 C)    General:  Alert and non-toxic, WDWN, NAD HEENT: NCAT, PERLA, EOM normal, no occular discharge or erythema.  Nasal mucosal edema with sinus tenderness to palpation.  Oropharynx clear with minimal oropharyngeal edema and erythema.  Mucous membranes moist and pink. Neck:  Cervical adenopathy Chest:  RRR no MRGs.  Lungs clear to auscultation A&P with no wheezes rhonchi or rales.   Abdomen: +BS x 4 quadrants, soft, non-tender, no guarding, rigidity, or rebound. Skin: warm and dry no rash Neuro: A&Ox4, CN II-XII grossly intact  Assessment and Plan:   1. Influenza A -positive for Flu A -prednisone -possible concommitant bacterial sinus infection as well  -Augmentin x 2 weeks -alternate tylenol and ibuprofen -nasal saline -cont flonase -cont phenergan dm -push fluids -go to ER for signs of dehydration, inability to control fevers, or worsening breathing problems.  - POCT Influenza A/B

## 2016-06-10 LAB — POCT INFLUENZA A/B
INFLUENZA B, POC: NEGATIVE
Influenza A, POC: POSITIVE — AB

## 2016-06-19 ENCOUNTER — Other Ambulatory Visit: Payer: Self-pay | Admitting: Internal Medicine

## 2016-06-19 MED ORDER — MECLIZINE HCL 25 MG PO TABS: 25.0000 mg | ORAL_TABLET | Freq: Three times a day (TID) | ORAL | 1 refills | Status: DC | PRN

## 2016-09-30 ENCOUNTER — Encounter: Payer: Self-pay | Admitting: *Deleted

## 2016-10-09 ENCOUNTER — Encounter: Payer: Self-pay | Admitting: *Deleted

## 2016-11-11 ENCOUNTER — Encounter: Payer: Self-pay | Admitting: Internal Medicine

## 2016-12-05 ENCOUNTER — Encounter: Payer: Self-pay | Admitting: Physician Assistant

## 2016-12-10 ENCOUNTER — Emergency Department (HOSPITAL_COMMUNITY): Payer: PRIVATE HEALTH INSURANCE | Admitting: Certified Registered Nurse Anesthetist

## 2016-12-10 ENCOUNTER — Inpatient Hospital Stay (HOSPITAL_COMMUNITY)
Admission: EM | Admit: 2016-12-10 | Discharge: 2016-12-16 | DRG: 331 | Disposition: A | Discharge status: Home / Self Care | Payer: PRIVATE HEALTH INSURANCE | Attending: Surgery | Admitting: Surgery

## 2016-12-10 ENCOUNTER — Encounter (HOSPITAL_COMMUNITY): Admission: EM | Disposition: A | Discharge status: Home / Self Care | Payer: Self-pay | Source: Home / Self Care

## 2016-12-10 ENCOUNTER — Encounter (HOSPITAL_COMMUNITY): Payer: Self-pay | Admitting: Emergency Medicine

## 2016-12-10 DIAGNOSIS — J449 Chronic obstructive pulmonary disease, unspecified: Secondary | ICD-10-CM | POA: Diagnosis present

## 2016-12-10 DIAGNOSIS — E785 Hyperlipidemia, unspecified: Secondary | ICD-10-CM | POA: Diagnosis present

## 2016-12-10 DIAGNOSIS — Z79899 Other long term (current) drug therapy: Secondary | ICD-10-CM | POA: Diagnosis not present

## 2016-12-10 DIAGNOSIS — D490 Neoplasm of unspecified behavior of digestive system: Secondary | ICD-10-CM | POA: Diagnosis present

## 2016-12-10 DIAGNOSIS — Z5331 Laparoscopic surgical procedure converted to open procedure: Secondary | ICD-10-CM

## 2016-12-10 DIAGNOSIS — F17210 Nicotine dependence, cigarettes, uncomplicated: Secondary | ICD-10-CM | POA: Diagnosis present

## 2016-12-10 DIAGNOSIS — K562 Volvulus: Secondary | ICD-10-CM

## 2016-12-10 DIAGNOSIS — N200 Calculus of kidney: Secondary | ICD-10-CM | POA: Diagnosis present

## 2016-12-10 DIAGNOSIS — R0789 Other chest pain: Secondary | ICD-10-CM

## 2016-12-10 DIAGNOSIS — R100 Acute abdomen: Secondary | ICD-10-CM | POA: Diagnosis present

## 2016-12-10 HISTORY — PX: LAPAROSCOPY: SHX197

## 2016-12-10 HISTORY — PX: LAPAROTOMY: SHX154

## 2016-12-10 HISTORY — DX: Volvulus: K56.2

## 2016-12-10 LAB — CBC
HEMATOCRIT: 39.9 % (ref 36.0–46.0)
HEMOGLOBIN: 13 g/dL (ref 12.0–15.0)
MCH: 30.2 pg (ref 26.0–34.0)
MCHC: 32.6 g/dL (ref 30.0–36.0)
MCV: 92.8 fL (ref 78.0–100.0)
Platelets: 247 10*3/uL (ref 150–400)
RBC: 4.3 MIL/uL (ref 3.87–5.11)
RDW: 12.4 % (ref 11.5–15.5)
WBC: 6.7 10*3/uL (ref 4.0–10.5)

## 2016-12-10 LAB — COMPREHENSIVE METABOLIC PANEL
ALBUMIN: 4.2 g/dL (ref 3.5–5.0)
ALK PHOS: 61 U/L (ref 38–126)
ALT: 14 U/L (ref 14–54)
ANION GAP: 6 (ref 5–15)
AST: 18 U/L (ref 15–41)
BILIRUBIN TOTAL: 0.2 mg/dL — AB (ref 0.3–1.2)
BUN: 12 mg/dL (ref 6–20)
CO2: 26 mmol/L (ref 22–32)
Calcium: 9.1 mg/dL (ref 8.9–10.3)
Chloride: 109 mmol/L (ref 101–111)
Creatinine, Ser: 0.62 mg/dL (ref 0.44–1.00)
GFR calc Af Amer: >60 mL/min (ref >60)
GFR calc non Af Amer: >60 mL/min (ref >60)
GLUCOSE: 109 mg/dL — AB (ref 65–99)
POTASSIUM: 3.9 mmol/L (ref 3.5–5.1)
SODIUM: 141 mmol/L (ref 135–145)
Total Protein: 6.9 g/dL (ref 6.5–8.1)

## 2016-12-10 LAB — URINALYSIS, ROUTINE W REFLEX MICROSCOPIC
BILIRUBIN URINE: NEGATIVE
GLUCOSE, UA: NEGATIVE mg/dL
HGB URINE DIPSTICK: NEGATIVE
KETONES UR: NEGATIVE mg/dL
Leukocytes, UA: NEGATIVE
Nitrite: NEGATIVE
PH: 6 (ref 5.0–8.0)
Protein, ur: NEGATIVE mg/dL
SPECIFIC GRAVITY, URINE: 1.013 (ref 1.005–1.030)

## 2016-12-10 LAB — LIPASE, BLOOD: Lipase: 30 U/L (ref 11–51)

## 2016-12-10 SURGERY — LAPAROSCOPY, DIAGNOSTIC
Anesthesia: General

## 2016-12-10 MED ORDER — FENTANYL CITRATE (PF) 100 MCG/2ML IJ SOLN
INTRAMUSCULAR | Status: AC
  Filled 2016-12-10: qty 2

## 2016-12-10 MED ORDER — PROPOFOL 10 MG/ML IV BOLUS
INTRAVENOUS | Status: AC
  Filled 2016-12-10: qty 20

## 2016-12-10 MED ORDER — PHENYLEPHRINE 40 MCG/ML (10ML) SYRINGE FOR IV PUSH (FOR BLOOD PRESSURE SUPPORT)
PREFILLED_SYRINGE | INTRAVENOUS | Status: AC
  Filled 2016-12-10: qty 10

## 2016-12-10 MED ORDER — MIDAZOLAM HCL 2 MG/2ML IJ SOLN
INTRAMUSCULAR | Status: AC
  Filled 2016-12-10: qty 2

## 2016-12-10 MED ORDER — ONDANSETRON HCL 4 MG/2ML IJ SOLN
4.0000 mg | Freq: Four times a day (QID) | INTRAMUSCULAR | Status: DC | PRN
  Administered 2016-12-11 (×2): 4 mg via INTRAVENOUS
  Filled 2016-12-10: qty 2

## 2016-12-10 MED ORDER — SUCCINYLCHOLINE CHLORIDE 200 MG/10ML IV SOSY
PREFILLED_SYRINGE | INTRAVENOUS | Status: AC
  Filled 2016-12-10: qty 10

## 2016-12-10 MED ORDER — PHENYLEPHRINE HCL 10 MG/ML IJ SOLN
INTRAMUSCULAR | Status: DC | PRN
  Administered 2016-12-10 (×5): 80 ug via INTRAVENOUS

## 2016-12-10 MED ORDER — DEXTROSE 5 % IV SOLN: 2.0000 g | Freq: Once | INTRAVENOUS | Status: DC

## 2016-12-10 MED ORDER — 0.9 % SODIUM CHLORIDE (POUR BTL) OPTIME
TOPICAL | Status: DC | PRN
  Administered 2016-12-10: 3000 mL

## 2016-12-10 MED ORDER — ACETAMINOPHEN 10 MG/ML IV SOLN
INTRAVENOUS | Status: AC
  Filled 2016-12-10: qty 100

## 2016-12-10 MED ORDER — DIPHENHYDRAMINE HCL 50 MG/ML IJ SOLN: 12.5000 mg | Freq: Four times a day (QID) | INTRAMUSCULAR | Status: DC | PRN

## 2016-12-10 MED ORDER — POTASSIUM CHLORIDE IN NACL 20-0.9 MEQ/L-% IV SOLN
INTRAVENOUS | Status: DC
  Administered 2016-12-10: 100 mL/h via INTRAVENOUS
  Administered 2016-12-11 – 2016-12-13 (×4): via INTRAVENOUS
  Administered 2016-12-13: 100 mL/h via INTRAVENOUS
  Administered 2016-12-14 – 2016-12-15 (×3): via INTRAVENOUS
  Filled 2016-12-10 (×11): qty 1000

## 2016-12-10 MED ORDER — BUPIVACAINE HCL (PF) 0.25 % IJ SOLN
INTRAMUSCULAR | Status: AC
  Filled 2016-12-10: qty 30

## 2016-12-10 MED ORDER — MEPERIDINE HCL 50 MG/ML IJ SOLN: 6.2500 mg | INTRAMUSCULAR | Status: DC | PRN

## 2016-12-10 MED ORDER — FENTANYL CITRATE (PF) 100 MCG/2ML IJ SOLN
50.0000 ug | Freq: Once | INTRAMUSCULAR | Status: AC | PRN
  Administered 2016-12-10: 50 ug via INTRAVENOUS
  Administered 2016-12-10: 25 ug via INTRAVENOUS
  Administered 2016-12-10 (×3): 50 ug via INTRAVENOUS
  Administered 2016-12-10: 25 ug via INTRAVENOUS
  Administered 2016-12-10 (×2): 50 ug via INTRAVENOUS

## 2016-12-10 MED ORDER — MORPHINE SULFATE 2 MG/ML IV SOLN
INTRAVENOUS | Status: DC
  Administered 2016-12-10: 20:00:00 via INTRAVENOUS
  Administered 2016-12-11: 3 mg via INTRAVENOUS
  Administered 2016-12-11: 6 mg via INTRAVENOUS
  Administered 2016-12-11: 4.5 mg via INTRAVENOUS
  Administered 2016-12-11: 6 mg via INTRAVENOUS
  Administered 2016-12-11: 3 mg via INTRAVENOUS
  Administered 2016-12-11: 1.5 mg via INTRAVENOUS
  Administered 2016-12-12 (×3): 3 mg via INTRAVENOUS
  Administered 2016-12-12: 1.5 mg via INTRAVENOUS
  Administered 2016-12-12: 3 mg via INTRAVENOUS
  Administered 2016-12-12: 1.5 mg via INTRAVENOUS
  Administered 2016-12-13: 25 mg via INTRAVENOUS
  Administered 2016-12-13: 1.5 mg via INTRAVENOUS
  Administered 2016-12-13: 0 mg via INTRAVENOUS
  Filled 2016-12-10 (×2): qty 30

## 2016-12-10 MED ORDER — ONDANSETRON HCL 4 MG/2ML IJ SOLN
4.0000 mg | Freq: Once | INTRAMUSCULAR | Status: AC | PRN
  Administered 2016-12-10: 4 mg via INTRAVENOUS

## 2016-12-10 MED ORDER — NALOXONE HCL 0.4 MG/ML IJ SOLN: 0.4000 mg | INTRAMUSCULAR | Status: DC | PRN

## 2016-12-10 MED ORDER — PROPOFOL 10 MG/ML IV BOLUS
INTRAVENOUS | Status: DC | PRN
  Administered 2016-12-10: 100 mg via INTRAVENOUS

## 2016-12-10 MED ORDER — PROMETHAZINE HCL 25 MG/ML IJ SOLN: 6.2500 mg | INTRAMUSCULAR | Status: DC | PRN

## 2016-12-10 MED ORDER — ROCURONIUM BROMIDE 100 MG/10ML IV SOLN
INTRAVENOUS | Status: DC | PRN
  Administered 2016-12-10: 30 mg via INTRAVENOUS
  Administered 2016-12-10: 20 mg via INTRAVENOUS

## 2016-12-10 MED ORDER — LIDOCAINE 2% (20 MG/ML) 5 ML SYRINGE
INTRAMUSCULAR | Status: AC
  Filled 2016-12-10: qty 5

## 2016-12-10 MED ORDER — ACETAMINOPHEN 10 MG/ML IV SOLN
1000.0000 mg | Freq: Four times a day (QID) | INTRAVENOUS | Status: DC
  Administered 2016-12-10: 1000 mg via INTRAVENOUS

## 2016-12-10 MED ORDER — DEXAMETHASONE SODIUM PHOSPHATE 10 MG/ML IJ SOLN
INTRAMUSCULAR | Status: AC
  Filled 2016-12-10: qty 1

## 2016-12-10 MED ORDER — ONDANSETRON HCL 4 MG/2ML IJ SOLN
4.0000 mg | Freq: Four times a day (QID) | INTRAMUSCULAR | Status: DC | PRN
  Filled 2016-12-10: qty 2

## 2016-12-10 MED ORDER — DEXAMETHASONE SODIUM PHOSPHATE 4 MG/ML IJ SOLN
INTRAMUSCULAR | Status: DC | PRN
  Administered 2016-12-10: 10 mg via INTRAVENOUS

## 2016-12-10 MED ORDER — MIDAZOLAM HCL 5 MG/5ML IJ SOLN
INTRAMUSCULAR | Status: DC | PRN
  Administered 2016-12-10: 2 mg via INTRAVENOUS

## 2016-12-10 MED ORDER — LACTATED RINGERS IV SOLN
INTRAVENOUS | Status: DC | PRN
  Administered 2016-12-10 (×2): via INTRAVENOUS

## 2016-12-10 MED ORDER — LIDOCAINE HCL (CARDIAC) 20 MG/ML IV SOLN
INTRAVENOUS | Status: DC | PRN
  Administered 2016-12-10 (×2): 20 mg via INTRAVENOUS

## 2016-12-10 MED ORDER — HYDROMORPHONE HCL-NACL 0.5-0.9 MG/ML-% IV SOSY
PREFILLED_SYRINGE | INTRAVENOUS | Status: AC
  Administered 2016-12-10: 0.5 mg via INTRAVENOUS
  Filled 2016-12-10: qty 1

## 2016-12-10 MED ORDER — ROCURONIUM BROMIDE 50 MG/5ML IV SOSY
PREFILLED_SYRINGE | INTRAVENOUS | Status: AC
  Filled 2016-12-10: qty 5

## 2016-12-10 MED ORDER — SUGAMMADEX SODIUM 200 MG/2ML IV SOLN
INTRAVENOUS | Status: AC
  Filled 2016-12-10: qty 2

## 2016-12-10 MED ORDER — LACTATED RINGERS IR SOLN
Status: DC | PRN
  Administered 2016-12-10: 1000 mL

## 2016-12-10 MED ORDER — BUPIVACAINE HCL (PF) 0.25 % IJ SOLN
INTRAMUSCULAR | Status: DC | PRN
  Administered 2016-12-10: 26 mL

## 2016-12-10 MED ORDER — CEFOTETAN DISODIUM-DEXTROSE 2-2.08 GM-% IV SOLR
INTRAVENOUS | Status: AC
  Filled 2016-12-10: qty 50

## 2016-12-10 MED ORDER — HYDROMORPHONE HCL-NACL 0.5-0.9 MG/ML-% IV SOSY
PREFILLED_SYRINGE | INTRAVENOUS | Status: AC
  Administered 2016-12-10: 0.5 mg via INTRAVENOUS
  Filled 2016-12-10: qty 3

## 2016-12-10 MED ORDER — ENOXAPARIN SODIUM 40 MG/0.4ML ~~LOC~~ SOLN
40.0000 mg | SUBCUTANEOUS | Status: DC
  Administered 2016-12-11 – 2016-12-16 (×6): 40 mg via SUBCUTANEOUS
  Filled 2016-12-10 (×6): qty 0.4

## 2016-12-10 MED ORDER — CEFOTETAN DISODIUM-DEXTROSE 2-2.08 GM-% IV SOLR
2.0000 g | Freq: Once | INTRAVENOUS | Status: AC
  Administered 2016-12-10: 2 g via INTRAVENOUS

## 2016-12-10 MED ORDER — MIDAZOLAM HCL 2 MG/2ML IJ SOLN: 0.5000 mg | Freq: Once | INTRAMUSCULAR | Status: DC | PRN

## 2016-12-10 MED ORDER — SODIUM CHLORIDE 0.9% FLUSH: 9.0000 mL | INTRAVENOUS | Status: DC | PRN

## 2016-12-10 MED ORDER — ONDANSETRON HCL 4 MG PO TABS
4.0000 mg | ORAL_TABLET | Freq: Four times a day (QID) | ORAL | Status: DC | PRN
  Administered 2016-12-11: 4 mg via ORAL
  Filled 2016-12-10: qty 1

## 2016-12-10 MED ORDER — MAGNESIUM SULFATE IN D5W 1-5 GM/100ML-% IV SOLN: 1.0000 g | Freq: Once | INTRAVENOUS | Status: DC

## 2016-12-10 MED ORDER — HYDROMORPHONE HCL-NACL 0.5-0.9 MG/ML-% IV SOSY
0.2500 mg | PREFILLED_SYRINGE | INTRAVENOUS | Status: DC | PRN
  Administered 2016-12-10 (×4): 0.5 mg via INTRAVENOUS

## 2016-12-10 MED ORDER — ONDANSETRON HCL 4 MG/2ML IJ SOLN
INTRAMUSCULAR | Status: AC
  Filled 2016-12-10: qty 2

## 2016-12-10 MED ORDER — SUGAMMADEX SODIUM 200 MG/2ML IV SOLN
INTRAVENOUS | Status: DC | PRN
  Administered 2016-12-10: 100 mg via INTRAVENOUS

## 2016-12-10 MED ORDER — DIPHENHYDRAMINE HCL 12.5 MG/5ML PO ELIX: 12.5000 mg | ORAL_SOLUTION | Freq: Four times a day (QID) | ORAL | Status: DC | PRN

## 2016-12-10 MED ORDER — SUCCINYLCHOLINE CHLORIDE 20 MG/ML IJ SOLN
INTRAMUSCULAR | Status: DC | PRN
  Administered 2016-12-10: 100 mg via INTRAVENOUS

## 2016-12-10 SURGICAL SUPPLY — 78 items
ADH SKN CLS APL DERMABOND .7 (GAUZE/BANDAGES/DRESSINGS) ×1
APPLIER CLIP 5 13 M/L LIGAMAX5 (MISCELLANEOUS)
APPLIER CLIP ROT 10 11.4 M/L (STAPLE)
APR CLP MED LRG 11.4X10 (STAPLE)
APR CLP MED LRG 5 ANG JAW (MISCELLANEOUS)
BLADE EXTENDED COATED 6.5IN (ELECTRODE) IMPLANT
BLADE HEX COATED 2.75 (ELECTRODE) IMPLANT
BLADE SURG SZ10 CARB STEEL (BLADE) IMPLANT
CELLS DAT CNTRL 66122 CELL SVR (MISCELLANEOUS) ×1 IMPLANT
CHLORAPREP W/TINT 26ML (MISCELLANEOUS) ×3 IMPLANT
CLIP APPLIE 5 13 M/L LIGAMAX5 (MISCELLANEOUS) IMPLANT
CLIP APPLIE ROT 10 11.4 M/L (STAPLE) IMPLANT
CLOSURE WOUND 1/2 X4 (GAUZE/BANDAGES/DRESSINGS)
COVER MAYO STAND STRL (DRAPES) ×5 IMPLANT
COVER SURGICAL LIGHT HANDLE (MISCELLANEOUS) ×3 IMPLANT
DECANTER SPIKE VIAL GLASS SM (MISCELLANEOUS) ×3 IMPLANT
DERMABOND ADVANCED (GAUZE/BANDAGES/DRESSINGS) ×2
DERMABOND ADVANCED .7 DNX12 (GAUZE/BANDAGES/DRESSINGS) IMPLANT
DRAPE LAPAROSCOPIC ABDOMINAL (DRAPES) ×3 IMPLANT
DRAPE WARM FLUID 44X44 (DRAPE) ×2 IMPLANT
DRSG OPSITE POSTOP 4X6 (GAUZE/BANDAGES/DRESSINGS) ×2 IMPLANT
ELECT PENCIL ROCKER SW 15FT (MISCELLANEOUS) IMPLANT
ELECT REM PT RETURN 15FT ADLT (MISCELLANEOUS) ×3 IMPLANT
FILTER SMOKE EVAC LAPAROSHD (FILTER) IMPLANT
GAUZE SPONGE 4X4 12PLY STRL (GAUZE/BANDAGES/DRESSINGS) ×3 IMPLANT
GAUZE SPONGE 4X4 16PLY XRAY LF (GAUZE/BANDAGES/DRESSINGS) ×2 IMPLANT
GLOVE BIO SURGEON STRL SZ7 (GLOVE) ×3 IMPLANT
GLOVE BIOGEL M 7.0 STRL (GLOVE) ×3 IMPLANT
GLOVE BIOGEL PI IND STRL 7.0 (GLOVE) ×1 IMPLANT
GLOVE BIOGEL PI IND STRL 7.5 (GLOVE) ×1 IMPLANT
GLOVE BIOGEL PI INDICATOR 7.0 (GLOVE) ×2
GLOVE BIOGEL PI INDICATOR 7.5 (GLOVE) ×2
GLOVE SURG SIGNA 7.5 PF LTX (GLOVE) ×3 IMPLANT
GOWN STRL REUS W/ TWL XL LVL3 (GOWN DISPOSABLE) ×1 IMPLANT
GOWN STRL REUS W/TWL LRG LVL3 (GOWN DISPOSABLE) ×6 IMPLANT
GOWN STRL REUS W/TWL XL LVL3 (GOWN DISPOSABLE) ×9 IMPLANT
HANDLE SUCTION POOLE (INSTRUMENTS) IMPLANT
KIT BASIN OR (CUSTOM PROCEDURE TRAY) ×3 IMPLANT
LIGASURE IMPACT 36 18CM CVD LR (INSTRUMENTS) ×2 IMPLANT
NEEDLE HYPO 22GX1.5 SAFETY (NEEDLE) ×2 IMPLANT
PACK GENERAL/GYN (CUSTOM PROCEDURE TRAY) ×3 IMPLANT
RELOAD PROXIMATE 75MM BLUE (ENDOMECHANICALS) ×6 IMPLANT
RELOAD STAPLE 75 3.8 BLU REG (ENDOMECHANICALS) IMPLANT
RETRACTOR WND ALEXIS 18 MED (MISCELLANEOUS) IMPLANT
RTRCTR WOUND ALEXIS 18CM MED (MISCELLANEOUS) ×3
SEALER TISSUE X1 CVD JAW (INSTRUMENTS) IMPLANT
SHEARS HARMONIC ACE PLUS 36CM (ENDOMECHANICALS) IMPLANT
SLEEVE XCEL OPT CAN 5 100 (ENDOMECHANICALS) ×2 IMPLANT
SOLUTION ANTI FOG 6CC (MISCELLANEOUS) ×3 IMPLANT
SPONGE LAP 18X18 X RAY DECT (DISPOSABLE) IMPLANT
STAPLER GUN LINEAR PROX 60 (STAPLE) ×2 IMPLANT
STAPLER PROXIMATE 75MM BLUE (STAPLE) ×2 IMPLANT
STAPLER VISISTAT 35W (STAPLE) ×3 IMPLANT
STRIP CLOSURE SKIN 1/2X4 (GAUZE/BANDAGES/DRESSINGS) IMPLANT
SUCTION POOLE HANDLE (INSTRUMENTS)
SUT MNCRL AB 4-0 PS2 18 (SUTURE) ×2 IMPLANT
SUT PDS AB 1 TP1 96 (SUTURE) IMPLANT
SUT PROLENE 2 0 KS (SUTURE) IMPLANT
SUT PROLENE 2 0 SH DA (SUTURE) IMPLANT
SUT SILK 2 0 (SUTURE)
SUT SILK 2 0 SH CR/8 (SUTURE) IMPLANT
SUT SILK 2 0SH CR/8 30 (SUTURE) ×2 IMPLANT
SUT SILK 2-0 18XBRD TIE 12 (SUTURE) IMPLANT
SUT SILK 3 0 (SUTURE)
SUT SILK 3 0 SH CR/8 (SUTURE) ×2 IMPLANT
SUT SILK 3-0 18XBRD TIE 12 (SUTURE) IMPLANT
SYR CONTROL 10ML LL (SYRINGE) ×2 IMPLANT
SYS LAPSCP GELPORT 120MM (MISCELLANEOUS)
SYSTEM LAPSCP GELPORT 120MM (MISCELLANEOUS) IMPLANT
TOWEL OR 17X26 10 PK STRL BLUE (TOWEL DISPOSABLE) ×3 IMPLANT
TRAY LAPAROSCOPIC (CUSTOM PROCEDURE TRAY) ×3 IMPLANT
TROCAR XCEL 12X100 BLDLESS (ENDOMECHANICALS) IMPLANT
TROCAR XCEL BLUNT TIP 100MML (ENDOMECHANICALS) IMPLANT
TROCAR XCEL NON-BLD 11X100MML (ENDOMECHANICALS) IMPLANT
TROCAR XCEL UNIV SLVE 11M 100M (ENDOMECHANICALS) IMPLANT
TUBING INSUF HEATED (TUBING) ×3 IMPLANT
YANKAUER SUCT BULB TIP 10FT TU (MISCELLANEOUS) IMPLANT
YANKAUER SUCT BULB TIP NO VENT (SUCTIONS) ×2 IMPLANT

## 2016-12-10 NOTE — ED Triage Notes (Signed)
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 A CT was done and is available in results. Alert and oriented. Pain has lessened at this time.

## 2016-12-10 NOTE — Anesthesia Postprocedure Evaluation (Signed)
Anesthesia Post Note  Patient: [name]  Procedure(s) Performed: Procedure(s) (LRB): LAPAROSCOPY DIAGNOSTIC (N/A) POSSIBLE EXPLORATORY LAPAROTOMY, POSSIBLE SMALL BOWEL RESECTION (N/A)     Patient location during evaluation: PACU Anesthesia Type: General Level of consciousness: patient cooperative, oriented and sedated Pain management: pain level controlled Vital Signs Assessment: post-procedure vital signs reviewed and stable Respiratory status: spontaneous breathing, nonlabored ventilation, respiratory function stable and patient connected to nasal cannula oxygen Cardiovascular status: blood pressure returned to baseline and stable Postop Assessment: no signs of nausea or vomiting Anesthetic complications: no    Last Vitals:  Vitals:   12/10/16 1930 12/10/16 1945  BP: (!) 111/55 (!) 112/57  Pulse: 74 77  Resp: 14 12  Temp: 36.6 C     Last Pain:  Vitals:   12/10/16 1930  TempSrc:   PainSc: Asleep                 [name],E. Jessicca Stitzer

## 2016-12-10 NOTE — Anesthesia Procedure Notes (Signed)
Procedure Name: Intubation Date/Time: 12/10/2016 4:58 PM Performed by: Claudia Desanctis Pre-anesthesia Checklist: Patient identified, Emergency Drugs available, Suction available and Patient being monitored Patient Re-evaluated:Patient Re-evaluated prior to induction Oxygen Delivery Method: Circle system utilized Preoxygenation: Pre-oxygenation with 100% oxygen Induction Type: IV induction, Rapid sequence and Cricoid Pressure applied Ventilation: Mask ventilation without difficulty Laryngoscope Size: 2 and Miller Grade View: Grade I Tube type: Oral Tube size: 7.0 mm Number of attempts: 1 Airway Equipment and Method: Stylet Placement Confirmation: ETT inserted through vocal cords under direct vision,  positive ETCO2 and breath sounds checked- equal and bilateral Secured at: 22 cm Tube secured with: Tape Dental Injury: Teeth and Oropharynx as per pre-operative assessment

## 2016-12-10 NOTE — Anesthesia Preprocedure Evaluation (Addendum)
Anesthesia Evaluation  Patient identified by MRN, date of birth, ID band Patient awake    Reviewed: Allergy & Precautions, NPO status , Patient's Chart, lab work & pertinent test results  History of Anesthesia Complications Negative for: history of anesthetic complications  Airway Mallampati: I  TM Distance: >3 FB Neck ROM: Full    Dental  (+) Upper Dentures, Partial Lower, Dental Advisory Given   Pulmonary COPD,  COPD inhaler, Current Smoker,    breath sounds clear to auscultation       Cardiovascular (-) anginanegative cardio ROS   Rhythm:Regular Rate:Normal     Neuro/Psych negative neurological ROS     GI/Hepatic Neg liver ROS, Nausea and acute abdominal pain   Endo/Other  negative endocrine ROS  Renal/GU negative Renal ROS     Musculoskeletal  (+) Arthritis ,   Abdominal   Peds  Hematology negative hematology ROS (+)   Anesthesia Other Findings   Reproductive/Obstetrics                            Anesthesia Physical Anesthesia Plan  ASA: II and emergent  Anesthesia Plan: General   Post-op Pain Management:    Induction: Intravenous and Rapid sequence  PONV Risk Score and Plan: 4 or greater and Ondansetron, Dexamethasone, Midazolam and Scopolamine patch - Pre-op  Airway Management Planned: Oral ETT  Additional Equipment:   Intra-op Plan:   Post-operative Plan: Extubation in OR  Informed Consent: I have reviewed the patients History and Physical, chart, labs and discussed the procedure including the risks, benefits and alternatives for the proposed anesthesia with the patient or authorized representative who has indicated his/her understanding and acceptance.   Dental advisory given  Plan Discussed with: CRNA and Surgeon  Anesthesia Plan Comments: (Plan routine monitors, GETA)        Anesthesia Quick Evaluation

## 2016-12-10 NOTE — H&P (Signed)
[facility] Surgery Admission Note  [name] 04-01-1951  [id number].    Requesting MD: Theotis Burrow Chief Complaint/Reason for Consult: Abdominal pain  HPI:  NAHDIA Miller is a 66yo female with no prior history of abdominal surgery, who presented to the [facility] complaining of abdominal pain. [name] works at [employer] office and states that around 0900 this morning she had acute onset severe abdominal pain in her abdomen. The pain was global, constant and sharp in nature. She did have some nausea, but no emesis. Pain was worse with movement, standing, and coughing. The pain somewhat improves with lying down. States that she had never had pain like this before. Denies fever, chills, diarrhea, constipation, dysuria. Last BM was yesterday evening. Last meal was breakfast early this morning. Dr. 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 [name] does have a prior CT of her abdomen/pelvis which does not show this displacement of abdominal organs. Her WBC is WNL and VSS, lactic acid is pending.  PMH significant for diverticulosis Surgical history: vaginal hysterectomy Anticoagulants: none Smokes 3 cigarettes/day Employment: staff member at [employer]  ROS: Review of Systems  Constitutional: Negative.   HENT: Negative.   Eyes: Negative.   Respiratory: Negative.   Cardiovascular: Negative.   Gastrointestinal: Positive for abdominal pain and nausea. Negative for constipation, diarrhea, heartburn and vomiting.  Genitourinary: Negative.   Musculoskeletal: Negative.   Skin: Negative.   Neurological: Negative.   All systems reviewed and otherwise negative except for as above  Family History  Problem Relation Age of Onset  . Atrial fibrillation Mother   . Thyroid disease Mother   . Hypertension Father   .  Heart disease Father        heart attack  . Cancer Paternal Uncle        leukemia  . Cancer Maternal Grandmother        leukemia  . Cancer Paternal Grandfather        leukemia    Past Medical History:  Diagnosis Date  . Arthritis   . Hemorrhoids   . History of diverticulitis of colon   . Hyperlipidemia   . Kidney stones     Past Surgical History:  Procedure Laterality Date  . BREAST CYST EXCISION     X2  . COLONOSCOPY  nov 2011  . KNEE SURGERY  07/2006   right  . VAGINAL HYSTERECTOMY     total    Social History:  reports that she has been smoking Cigarettes.  She has smoked for the past 30.00 years. She has never used smokeless tobacco. She reports that she does not drink alcohol or use drugs.  Allergies: No Known Allergies   (Not in a hospital admission)  Prior to Admission medications   Medication Sig Start Date End Date Taking? Authorizing Provider  albuterol (PROVENTIL HFA;VENTOLIN HFA) 108 (90 Base) MCG/ACT inhaler Inhale 2 puffs into the lungs every 6 (six) hours as needed for wheezing or shortness of breath (cough). 06/09/16   Forcucci, Courtney, PA-C  amoxicillin-clavulanate (AUGMENTIN) 875-125 MG tablet Take 1 tablet by mouth 2 (two) times daily. One po bid x 14 days 06/09/16   Forcucci, Courtney, PA-C  azelastine (ASTELIN) 0.1 % nasal spray Place 2 sprays into both nostrils 2 (two) times daily. Use in each nostril as directed 05/22/16 05/22/17  Starlyn Skeans,  PA-C  Magnesium 500 MG TABS Take 500 mg by mouth daily.    [provider]  meclizine (ANTIVERT) 25 MG tablet Take 1 tablet (25 mg total) by mouth 3 (three) times daily as needed for dizziness. 06/19/16   Forcucci, Courtney, PA-C  Multiple Vitamins-Minerals (ONE-A-DAY WOMENS 50+ ADVANTAGE PO) Take by mouth daily.    [provider]  predniSONE (DELTASONE) 20 MG tablet 3 tabs po daily x 3 days, then 2 tabs x 3 days, then 1.5 tabs x 3 days, then 1 tab x 3 days, then 0.5 tabs x 3 days 06/09/16    Starlyn Skeans, PA-C  promethazine-dextromethorphan (PROMETHAZINE-DM) 6.25-15 MG/5ML syrup Take 5-10 ML PO q8hrs prn for cough and congestion 05/22/16   Forcucci, Courtney, PA-C  Triamcinolone Acetonide (NASACORT ALLERGY 24HR NA) Place into the nose daily as needed.    [provider]    Blood pressure 137/81, pulse 72, temperature 98.1 F (36.7 C), temperature source Oral, resp. rate 18, height _0  (1.499 m), weight 113 lb (51.3 kg), SpO2 99 %. Physical Exam: General: pleasant, WD/WN white female who is laying in bed in NAD HEENT: head is normocephalic, atraumatic.  Sclera are noninjected.  Pupils equal and round.  Ears and nose without any masses or lesions.  Mouth is pink and moist. Dentition fair Heart: regular, rate, and rhythm.  No obvious murmurs, gallops, or rubs noted.  Palpable pedal pulses bilaterally Lungs: CTAB, no wheezes, rhonchi, or rales noted.  Respiratory effort nonlabored Abd: soft, mild distension, +BS in all 4 quadrants, no masses, hernias, or organomegaly. Generalized tenderness with rebound and guarding, concern for peritonitis. MS: all 4 extremities are symmetrical with no cyanosis, clubbing, or edema. Skin: warm and dry with no masses, lesions, or rashes Psych: A&Ox3 with an appropriate affect. Neuro: cranial nerves grossly intact, extremity CSM intact bilaterally, normal speech  Results for orders placed or performed during the hospital encounter of 12/10/16 (from the past 48 hour(s))  Lipase, blood     Status: None   Collection Time: 12/10/16 12:17 PM  Result Value Ref Range   Lipase 30 11 - 51 U/L  Comprehensive metabolic panel     Status: Abnormal   Collection Time: 12/10/16 12:17 PM  Result Value Ref Range   Sodium 141 135 - 145 mmol/L   Potassium 3.9 3.5 - 5.1 mmol/L   Chloride 109 101 - 111 mmol/L   CO2 26 22 - 32 mmol/L   Glucose, Bld 109 (H) 65 - 99 mg/dL   BUN 12 6 - 20 mg/dL   Creatinine, Ser 0.62 0.44 - 1.00 mg/dL   Calcium 9.1 8.9  - 10.3 mg/dL   Total Protein 6.9 6.5 - 8.1 g/dL   Albumin 4.2 3.5 - 5.0 g/dL   AST 18 15 - 41 U/L   ALT 14 14 - 54 U/L   Alkaline Phosphatase 61 38 - 126 U/L   Total Bilirubin 0.2 (L) 0.3 - 1.2 mg/dL   GFR calc non Af Amer >60 >60 mL/min   GFR calc Af Amer >60 >60 mL/min    Comment: (NOTE) The eGFR has been calculated using the CKD EPI equation. This calculation has not been validated in all clinical situations. eGFR's persistently <60 mL/min signify possible Chronic Kidney Disease.    Anion gap 6 5 - 15  CBC     Status: None   Collection Time: 12/10/16 12:17 PM  Result Value Ref Range   WBC 6.7 4.0 - 10.5 K/uL  RBC 4.30 3.87 - 5.11 MIL/uL   Hemoglobin 13.0 12.0 - 15.0 g/dL   HCT 39.9 36.0 - 46.0 %   MCV 92.8 78.0 - 100.0 fL   MCH 30.2 26.0 - 34.0 pg   MCHC 32.6 30.0 - 36.0 g/dL   RDW 12.4 11.5 - 15.5 %   Platelets 247 150 - 400 K/uL   No results found.    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 - WBC WNL, VSS, lactic acid pending - concern for peritonitis on exam - I have discussed case with Dr. Ninfa Linden who will evaluate patient and determine if surgical intervention is required. Please keep NPO.   Jerrye Beavers, [facility] Surgery 12/10/2016, 2:43 PM Pager: [phone] Consults: [phone] Mon-Fri 7:00 am-4:30 pm Sat-Sun 7:00 am-11:30 am

## 2016-12-10 NOTE — ED Provider Notes (Signed)
[place] DEPT Provider Note   CSN: [id number] Arrival date & time: 12/10/16  1208     History   Chief Complaint Chief Complaint  Patient presents with  . Abdominal Pain    HPI CLOVER FEEHAN is a 66 y.o. female with  No sig pmh who presents to the ED with cc of severe abdominal pain. The patient is a [employer] member at [employer] urology. At 9 AM today. The patient had sudden onset of excruciating abdominal pain that doubled her over. Dr. Jeffie Pollock  sent her for a noncontrast CT of the abdomen. The CT scan that showed disorganization of her intestines with the jejunum on the right side and the CT come on her left. Although the CT scan shows these new findings. It does not redo for volvulus. The patient, however, complains of abdominal pain that is worse with any coughing, laughing or movement. She feels improved laying still. She states that her pain is not bad, until she has any movement. She has no nausea at this time and declined pain medication currently. I have called for consult with surgery. She has no previous history of abdominal surgeries, bowel obstructions. She has not had pain prior to the event today. She denies urinary symptoms, fever. She denies diarrhea or constipation.  HPI  Past Medical History:  Diagnosis Date  . Arthritis   . Hemorrhoids   . History of diverticulitis of colon   . Hyperlipidemia   . Kidney stones     Patient Active Problem List   Diagnosis Date Noted  . 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 Routine general medical examination at a health care facility 03/10/2011  . Internal and external hemorrhoids, internal with prolapse and bleeding. 03/10/2011  . DIVERTICULITIS OF COLON 11/07/2009  . ABDOMINAL PAIN, LOWER 10/22/2009  . OTALGIA 06/12/2008  . TOBACCO ABUSE 08/16/2007  . RENAL CALCULUS 08/16/2007  . Hyperlipidemia 08/09/2007  . ABDOMINAL PAIN, UNSPECIFIED SITE 08/09/2007    Past Surgical History:   Procedure Laterality Date  . BREAST CYST EXCISION     X2  . COLONOSCOPY  nov 2011  . KNEE SURGERY  07/2006   right  . LAPAROSCOPY N/A 12/10/2016   Procedure: LAPAROSCOPY DIAGNOSTIC;  Surgeon: Coralie Keens, MD;  Location: [facility] ORS;  Service: General;  Laterality: N/A;  . LAPAROTOMY N/A 12/10/2016   Procedure: POSSIBLE EXPLORATORY LAPAROTOMY, POSSIBLE SMALL BOWEL RESECTION;  Surgeon: Coralie Keens, MD;  Location: [facility] ORS;  Service: General;  Laterality: N/A;  . VAGINAL HYSTERECTOMY     total    OB History    No data available       Home Medications    Prior to Admission medications   Medication Sig Start Date End Date Taking? Authorizing Provider  Multiple Vitamin (MULTIVITAMIN WITH MINERALS) TABS tablet Take 1 tablet by mouth daily.   Yes [provider]  albuterol (PROVENTIL HFA;VENTOLIN HFA) 108 (90 Base) MCG/ACT inhaler Inhale 2 puffs into the lungs every 6 (six) hours as needed for wheezing or shortness of breath (cough). Patient not taking: Reported on 12/11/2016 06/09/16   Starlyn Skeans, PA-C  azelastine (ASTELIN) 0.1 % nasal spray Place 2 sprays into both nostrils 2 (two) times daily. Use in each nostril as directed Patient not taking: Reported on 12/11/2016 05/22/16 05/22/17  Forcucci, Loma Sousa, PA-C  meclizine (ANTIVERT) 25 MG tablet Take 1 tablet (25 mg total) by mouth 3 (three) times daily as needed for dizziness. Patient not taking: Reported on 12/11/2016 06/19/16  Forcucci, Courtney, PA-C  predniSONE (DELTASONE) 20 MG tablet 3 tabs po daily x 3 days, then 2 tabs x 3 days, then 1.5 tabs x 3 days, then 1 tab x 3 days, then 0.5 tabs x 3 days Patient not taking: Reported on 12/11/2016 06/09/16   Starlyn Skeans, PA-C  promethazine-dextromethorphan (PROMETHAZINE-DM) 6.25-15 MG/5ML syrup Take 5-10 ML PO q8hrs prn for cough and congestion Patient not taking: Reported on 12/11/2016 05/22/16   Starlyn Skeans, PA-C    Family History Family History  Problem  Relation Age of Onset  . Atrial fibrillation Mother   . Thyroid disease Mother   . Hypertension Father   . Heart disease Father        heart attack  . Cancer Paternal Uncle        leukemia  . Cancer Maternal Grandmother        leukemia  . Cancer Paternal Grandfather        leukemia    Social History Social History  Substance Use Topics  . Smoking status: Current Every Day Smoker    Years: 30.00    Types: Cigarettes  . Smokeless tobacco: Never Used  . Alcohol use No     Allergies   Patient has no known allergies.   Review of Systems Review of Systems  Ten systems reviewed and are negative for acute change, except as noted in the HPI.   Physical Exam Updated Vital Signs BP (!) 116/56 (BP Location: Right Arm)   Pulse 82   Temp 98.5 F (36.9 C) (Oral)   Resp 18   Ht 4\' 11"  (1.499 m)   Wt 51.3 kg (113 lb)   SpO2 93%   BMI 22.82 kg/m   Physical Exam  Constitutional: She is oriented to person, place, and time. She appears well-developed and well-nourished. No distress.  HENT:  Head: Normocephalic and atraumatic.  Eyes: Conjunctivae are normal. No scleral icterus.  Neck: Normal range of motion.  Cardiovascular: Normal rate, regular rhythm and normal heart sounds.  Exam reveals no gallop and no friction rub.   No murmur heard. Pulmonary/Chest: Effort normal and breath sounds normal. No respiratory distress.  Abdominal: Soft. Bowel sounds are normal. She exhibits distension. There is generalized tenderness. There is rebound and guarding.    Exquisite tenderness, guarding, and pain throughout the abdomen, worse in the left lower quadrant. +peritoneal signs  Neurological: She is alert and oriented to person, place, and time.  Skin: Skin is warm and dry. She is not diaphoretic.  Psychiatric: Her behavior is normal.  Nursing note and vitals reviewed.    ED Treatments / Results  Labs (all labs ordered are listed, but only abnormal results are displayed) Labs  Reviewed  COMPREHENSIVE METABOLIC PANEL - Abnormal; Notable for the following:       Result Value   Glucose, Bld 109 (*)    Total Bilirubin 0.2 (*)    All other components within normal limits  BASIC METABOLIC PANEL - Abnormal; Notable for the following:    Glucose, Bld 138 (*)    Calcium 8.3 (*)    All other components within normal limits  CBC - Abnormal; Notable for the following:    WBC 16.4 (*)    Hemoglobin 11.5 (*)    HCT 35.8 (*)    All other components within normal limits  CBC - Abnormal; Notable for the following:    RBC 3.24 (*)    Hemoglobin 9.8 (*)    HCT 30.4 (*)  All other components within normal limits  BASIC METABOLIC PANEL - Abnormal; Notable for the following:    Calcium 8.1 (*)    All other components within normal limits  LIPASE, BLOOD  CBC  URINALYSIS, ROUTINE W REFLEX MICROSCOPIC  I-STAT CG4 LACTIC ACID, ED  I-STAT CG4 LACTIC ACID, ED  SURGICAL PATHOLOGY    EKG  EKG Interpretation None       Radiology No results found.  Procedures .Critical Care Performed by: Margarita Mail Authorized by: Margarita Mail   Critical care provider statement:    Critical care time (minutes):  60   Critical care was necessary to treat or prevent imminent or life-threatening deterioration of the following conditions: peritonitis, acute abdomen.   Critical care was time spent personally by me on the following activities:  Development of treatment plan with patient or surrogate, discussions with consultants, evaluation of patient's response to treatment, examination of patient, interpretation of cardiac output measurements, obtaining history from patient or surrogate, ordering and performing treatments and interventions, ordering and review of laboratory studies, ordering and review of radiographic studies and re-evaluation of patient's condition   (including critical care time)  Medications Ordered in ED Medications  enoxaparin (LOVENOX) injection 40 mg (40  mg Subcutaneous Given 12/12/16 0802)  0.9 % NaCl with KCl 20 mEq/ L  infusion (100 mL/hr Intravenous New Bag/Given 12/13/16 0541)  ondansetron (ZOFRAN) tablet 4 mg ( Oral See Alternative 12/11/16 2035)  naloxone [facility]) injection 0.4 mg (not administered)    And  sodium chloride flush (NS) 0.9 % injection 9 mL (not administered)  diphenhydrAMINE (BENADRYL) injection 12.5 mg (not administered)    Or  diphenhydrAMINE (BENADRYL) 12.5 MG/5ML elixir 12.5 mg (not administered)  morphine 2 mg/mL PCA injection (1.5 mg Intravenous Received 12/13/16 0542)  acetaminophen (OFIRMEV) 10 MG/ML IV (not administered)  methocarbamol (ROBAXIN) 500 mg in dextrose 5 % 50 mL IVPB (0 mg Intravenous Stopped 12/13/16 0611)  ondansetron (ZOFRAN) injection 4 mg (not administered)  promethazine (PHENERGAN) injection 6.25 mg (not administered)  fentaNYL (SUBLIMAZE) injection 50 mcg ( Intravenous MAR Unhold 12/10/16 2020)  ondansetron (ZOFRAN) injection 4 mg ( Intravenous MAR Unhold 12/10/16 2020)  cefoTEtan in Dextrose 5% (CEFOTAN) IVPB 2 g ( Intravenous MAR Unhold 12/10/16 2020)  cefoTEtan in Dextrose 5% (CEFOTAN) 2-2.08 GM-% IVPB (  Override pull for Anesthesia 12/10/16 1653)     Initial Impression / Assessment and Plan / ED Course  I have reviewed the triage vital signs and the nursing notes.  Pertinent labs & imaging results that were available during my care of the patient were reviewed by me and considered in my medical decision making (see chart for details).  Clinical Course as of Dec 13 817  Wed Dec 10, 2016  1605 pH: 6.0 [AH]    Clinical Course User Index [AH] Margarita Mail, PA-C    8:19 AM BP (!) 116/56 (BP Location: Right Arm)   Pulse 82   Temp 98.5 F (36.9 C) (Oral)   Resp 18   Ht 4\' 11"  (1.499 m)   Wt 51.3 kg (113 lb)   SpO2 93%   BMI 22.82 kg/m  Patient with surgical abdomen. I have added on a lactic acid. My highest suspicion is for volvulus. Given the findings on her CT scan as well as  her clinical examination. Patient seen in shared visit with Dr. Theotis Burrow. I have ordered pain medications when necessary. I've with PA Jerene Pitch of the surgical service who will consult on the patient.  Patient evaluated by Dr. Ninfa Linden and taken for emergent exploratory laparotomy.Patient seen in shared visit with attending physician. Who agrees with assessment, work up , treatment, and plan for emergent admission    Final Clinical Impressions(s) / ED Diagnoses   Final diagnoses:  Volvulus [facility])    New Prescriptions Current Discharge Medication List       Margarita Mail, PA-C 12/13/16 Herreid, Wenda Overland, MD 12/16/16 [phone]

## 2016-12-10 NOTE — Transfer of Care (Signed)
Immediate Anesthesia Transfer of Care Note  Patient: [name]  Procedure(s) Performed: Procedure(s): LAPAROSCOPY DIAGNOSTIC (N/A) POSSIBLE EXPLORATORY LAPAROTOMY, POSSIBLE SMALL BOWEL RESECTION (N/A)  Patient Location: PACU  Anesthesia Type:General  Level of Consciousness:  sedated, patient cooperative and responds to stimulation  Airway & Oxygen Therapy:Patient Spontanous Breathing and Patient connected to face mask oxgen  Post-op Assessment:  Report given to PACU RN and Post -op Vital signs reviewed and stable  Post vital signs:  Reviewed and stable  Last Vitals:  Vitals:   12/10/16 1214 12/10/16 1450  BP: 137/81 109/66  Pulse: 72 64  Resp: 18 16  Temp: 88.5 C     Complications: No apparent anesthesia complications

## 2016-12-10 NOTE — Op Note (Signed)
LAPAROSCOPY DIAGNOSTIC, POSSIBLE EXPLORATORY LAPAROTOMY, POSSIBLE SMALL BOWEL RESECTION  Procedure Note  [name] 12/10/2016   Pre-op Diagnosis: acute abdomen     Post-op Diagnosis: right colon volvulus  Procedure(s): LAPAROSCOPY DIAGNOSTIC EXPLORATORY LAPAROTOMY RIGHT  ILEOCOLECTOMY  Surgeon(s): Coralie Keens, MD Michael Boston, MD  Anesthesia: General  Staff:  Circulator: Lenoard Aden, RN Scrub Person: Romero Liner, CST; Truman Hayward, Luchin  Estimated Blood Loss: Minimal               Specimens: SENT TO PATH  Indications: This is a 66 year old female who presented with the sudden onset of severe diffuse abdominal pain. She underwent an emergent CAT scan showing a possible cecal volvulus versus internal hernia. Given the tenderness on physical examination and CT scan findings decision was made to proceed emergently to the operating room  Findings: The patient was found to have a volvulus of her entire right colon with the cecum resting in the left upper quadrant. There was twisting of the small bowel mesentery secondary to this. There was no ischemic bowel. Decision was made to proceed with an extended right ileocolectomy with primary anastomosis. No other intra-abdominal pathology was identified  Procedure: The patient was brought to the operative room and identified as the correct patient. She was placed supine on the operating table and general anesthesia was induced. Her abdomen was prepped and draped in the usual sterile fashion. I made a small vertical incision below the umbilicus with a scalpel.  I then dissected down to the fascia which was then opened with a scalpel as well. A hemostat was then used to pass into the peritoneal cavity under direct vision. A 0% suture was then placed around the fascia opening. The [facility] port was placed in the opening insufflation of the abdomen was begun. I then placed 25 mm trochars in the patient's left abdomen both under  direct vision. The cecum was identified in the left upper quadrant. I can follow it down going underneath the small bowel mesentery. I ran the small bowel from the ligament Treitz to the terminal ileum but could not completely untwist the mesentery.  At this point, the decision was made to convert to an open procedure. I removed the umbilical trocar and made the incision larger going above the umbilicus with the scalpel. I then took this down to the fascia with electrocautery. Once this was completed, I placed a wound protector in the incision. I then removed the 5 mm trochars. I do straight small bowel. I could then finally eviscerate the colon after the mesentery was untwisted. I found that her entire right colon had no attachments to the lateral sidewall. This had thus created the volvulus and twisting of the small bowel mesentery. Again, there was no ischemic bowel identified. I elected to then performed an ileocolectomy. I transected the small bowel proximal to the terminal ileum with the GIA-75 stapler. I then transected the proximal transverse colon with the GI 75 stapler as well. I then took down the mesentery with the LigaSure cautery device. Once this was completed the right colon was sent to pathology for evaluation. I then reapproximated the small bowel to the transverse colon in a side to side fashion with silk sutures. I performed a colotomy and enterotomy with the cautery. I then created a side-to-side anastomosis with the GIA-75 stapler. The open end was closed with a TA 60 stapler. The anastomosis appeared wide and well-perfused. I closed the mesenteric defect with silk sutures. We then again  ran the small bowel from the ligament Treitz to the anastomosis and the mesentery appeared to be no longer twisted. The abdomen was then irrigated with normal saline. We removed the wound protector. We changed gown and gloves as well as the drapes and are normal protocol. Hemostasis appeared to be achieved. I  closed the midline fascia with a running #1 PDS suture. I then anesthetized the incisions with Marcaine and closed the skin with 4-0 Monocryl sutures. Dermabond was then applied. A dressing was placed over the midline incision. The patient tolerated the procedure well. All the counts were correct at the end procedure. The patient was then extubated in the operating room and taken in a stable condition to the recovery room.          [name] A   Date: 12/10/2016  Time: 6:26 PM

## 2016-12-10 NOTE — ED Notes (Signed)
CHG completed on patient

## 2016-12-11 ENCOUNTER — Encounter (HOSPITAL_COMMUNITY): Payer: Self-pay | Admitting: Surgery

## 2016-12-11 LAB — CBC
HCT: 35.8 % — ABNORMAL LOW (ref 36.0–46.0)
HEMOGLOBIN: 11.5 g/dL — AB (ref 12.0–15.0)
MCH: 29.3 pg (ref 26.0–34.0)
MCHC: 32.1 g/dL (ref 30.0–36.0)
MCV: 91.3 fL (ref 78.0–100.0)
PLATELETS: 207 10*3/uL (ref 150–400)
RBC: 3.92 MIL/uL (ref 3.87–5.11)
RDW: 12.3 % (ref 11.5–15.5)
WBC: 16.4 10*3/uL — AB (ref 4.0–10.5)

## 2016-12-11 LAB — BASIC METABOLIC PANEL
ANION GAP: 7 (ref 5–15)
BUN: 10 mg/dL (ref 6–20)
CALCIUM: 8.3 mg/dL — AB (ref 8.9–10.3)
CO2: 23 mmol/L (ref 22–32)
Chloride: 109 mmol/L (ref 101–111)
Creatinine, Ser: 0.54 mg/dL (ref 0.44–1.00)
Glucose, Bld: 138 mg/dL — ABNORMAL HIGH (ref 65–99)
Potassium: 4.1 mmol/L (ref 3.5–5.1)
SODIUM: 139 mmol/L (ref 135–145)

## 2016-12-11 MED ORDER — METHOCARBAMOL 1000 MG/10ML IJ SOLN
500.0000 mg | Freq: Four times a day (QID) | INTRAVENOUS | Status: DC
  Administered 2016-12-11 – 2016-12-15 (×12): 500 mg via INTRAVENOUS
  Filled 2016-12-11: qty 550
  Filled 2016-12-11: qty 5
  Filled 2016-12-11: qty 550
  Filled 2016-12-11: qty 5
  Filled 2016-12-11 (×3): qty 550
  Filled 2016-12-11 (×3): qty 5
  Filled 2016-12-11 (×2): qty 550
  Filled 2016-12-11: qty 5
  Filled 2016-12-11: qty 550
  Filled 2016-12-11 (×3): qty 5

## 2016-12-11 NOTE — Progress Notes (Signed)
Assessment Principal Problem:   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-poor pain control; we discussed the operation   Plan:  Add Robaxin.  OOB.  Foley out tomorrow.   LOS: 1 day     1 Day Post-Op  Chief Complaint/Subjective: Very sore.  Some nausea.  Objective: Vital signs in last 24 hours: Temp:  [97.7 F (36.5 C)-98.6 F (37 C)] 97.8 F (36.6 C) (07/26 0544) Pulse Rate:  [63-80] 73 (07/26 0544) Resp:  [12-30] 25 (07/26 0544) BP: (102-137)/(46-81) 113/50 (07/26 0544) SpO2:  [91 %-100 %] 98 % (07/26 0544) Weight:  [51.3 kg (113 lb)] 51.3 kg (113 lb) (07/25 1215) Last BM Date: 12/09/16  Intake/Output from previous day: 07/25 0701 - 07/26 0700 In: 1705 [I.V.:1605; IV Piggyback:100] Out: 975 [Urine:930; Blood:45] Intake/Output this shift: No intake/output data recorded.  PE: General- In NAD.  Awake and alert. Abdomen-soft, some distension, quiet, some bruising around midline incision  Lab Results:   Recent Labs  12/10/16 1217 12/11/16 0536  WBC 6.7 16.4*  HGB 13.0 11.5*  HCT 39.9 35.8*  PLT 247 207   BMET  Recent Labs  12/10/16 1217 12/11/16 0536  NA 141 139  K 3.9 4.1  CL 109 109  CO2 26 23  GLUCOSE 109* 138*  BUN 12 10  CREATININE 0.62 0.54  CALCIUM 9.1 8.3*   PT/INR No results for input(s): LABPROT, INR in the last 72 hours. Comprehensive Metabolic Panel:    Component Value Date/Time   NA 139 12/11/2016 0536   NA 141 12/10/2016 1217   K 4.1 12/11/2016 0536   K 3.9 12/10/2016 1217   CL 109 12/11/2016 0536   CL 109 12/10/2016 1217   CO2 23 12/11/2016 0536   CO2 26 12/10/2016 1217   BUN 10 12/11/2016 0536   BUN 12 12/10/2016 1217   CREATININE 0.54 12/11/2016 0536   CREATININE 0.62 12/10/2016 1217   CREATININE 0.68 11/09/2015 1032   CREATININE 0.63 04/20/2015 0927   GLUCOSE 138 (H) 12/11/2016 0536   GLUCOSE 109 (H) 12/10/2016 1217   CALCIUM 8.3 (L) 12/11/2016 0536   CALCIUM 9.1 12/10/2016 1217   AST 18 12/10/2016  1217   AST 22 11/09/2015 1032   ALT 14 12/10/2016 1217   ALT 20 11/09/2015 1032   ALKPHOS 61 12/10/2016 1217   ALKPHOS 56 11/09/2015 1032   BILITOT 0.2 (L) 12/10/2016 1217   BILITOT 0.5 11/09/2015 1032   PROT 6.9 12/10/2016 1217   PROT 6.7 11/09/2015 1032   ALBUMIN 4.2 12/10/2016 1217   ALBUMIN 4.4 11/09/2015 1032     Studies/Results: No results found.  Anti-infectives: Anti-infectives    Start     Dose/Rate Route Frequency Ordered Stop   12/10/16 1623  cefoTEtan in Dextrose 5% (CEFOTAN) 2-2.08 GM-% IVPB    Comments:  [place], Ron   : cabinet override      12/10/16 1623 12/10/16 1653   12/10/16 1615  cefoTEtan in Dextrose 5% (CEFOTAN) IVPB 2 g     2 g Intravenous  Once 12/10/16 1611 12/10/16 1653   12/10/16 1600  cefoTEtan (CEFOTAN) 2 g in dextrose 5 % 50 mL IVPB  Status:  Discontinued     2 g 100 mL/hr over 30 Minutes Intravenous  Once 12/10/16 1559 12/10/16 1611       Deniss Wormley J 12/11/2016

## 2016-12-12 MED ORDER — ONDANSETRON HCL 4 MG/2ML IJ SOLN
4.0000 mg | INTRAMUSCULAR | Status: DC | PRN
Start: 2016-12-12 — End: 2016-12-13

## 2016-12-12 MED ORDER — PROMETHAZINE HCL 25 MG/ML IJ SOLN: 6.2500 mg | INTRAMUSCULAR | Status: DC | PRN

## 2016-12-12 NOTE — Progress Notes (Signed)
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  Wait for bowel function to return.   LOS: 2 days     2 Days Post-Op  Chief Complaint/Subjective: Still with nausea.  No flatus.  Has not walked.  Objective: Vital signs in last 24 hours: Temp:  [98 F (36.7 C)-99.2 F (37.3 C)] 98.9 F (37.2 C) (07/27 0516) Pulse Rate:  [78-85] 80 (07/27 0516) Resp:  [17-24] 20 (07/27 0516) BP: (102-114)/(44-54) 114/54 (07/27 0516) SpO2:  [97 %-100 %] 97 % (07/27 0516) Last BM Date: 12/09/16  Intake/Output from previous day: 07/26 0701 - 07/27 0700 In: 2200 [I.V.:2200] Out: 2350 [Urine:2350] Intake/Output this shift: No intake/output data recorded.  PE: General- In NAD.  Awake and alert. Abdomen-soft, some distension, quiet, some bruising around midline incision  Lab Results:   Recent Labs  12/10/16 1217 12/11/16 0536  WBC 6.7 16.4*  HGB 13.0 11.5*  HCT 39.9 35.8*  PLT 247 207   BMET  Recent Labs  12/10/16 1217 12/11/16 0536  NA 141 139  K 3.9 4.1  CL 109 109  CO2 26 23  GLUCOSE 109* 138*  BUN 12 10  CREATININE 0.62 0.54  CALCIUM 9.1 8.3*   PT/INR No results for input(s): LABPROT, INR in the last 72 hours. Comprehensive Metabolic Panel:    Component Value Date/Time   NA 139 12/11/2016 0536   NA 141 12/10/2016 1217   K 4.1 12/11/2016 0536   K 3.9 12/10/2016 1217   CL 109 12/11/2016 0536   CL 109 12/10/2016 1217   CO2 23 12/11/2016 0536   CO2 26 12/10/2016 1217   BUN 10 12/11/2016 0536   BUN 12 12/10/2016 1217   CREATININE 0.54 12/11/2016 0536   CREATININE 0.62 12/10/2016 1217   CREATININE 0.68 11/09/2015 1032   CREATININE 0.63 04/20/2015 0927   GLUCOSE 138 (H) 12/11/2016 0536   GLUCOSE 109 (H) 12/10/2016 1217   CALCIUM 8.3 (L) 12/11/2016 0536   CALCIUM 9.1 12/10/2016 1217   AST 18 12/10/2016 1217   AST 22 11/09/2015 1032   ALT 14 12/10/2016 1217   ALT 20  11/09/2015 1032   ALKPHOS 61 12/10/2016 1217   ALKPHOS 56 11/09/2015 1032   BILITOT 0.2 (L) 12/10/2016 1217   BILITOT 0.5 11/09/2015 1032   PROT 6.9 12/10/2016 1217   PROT 6.7 11/09/2015 1032   ALBUMIN 4.2 12/10/2016 1217   ALBUMIN 4.4 11/09/2015 1032     Studies/Results: No results found.  Anti-infectives: Anti-infectives    Start     Dose/Rate Route Frequency Ordered Stop   12/10/16 1623  cefoTEtan in Dextrose 5% (CEFOTAN) 2-2.08 GM-% IVPB    Comments:  [place], Ron   : cabinet override      12/10/16 1623 12/10/16 1653   12/10/16 1615  cefoTEtan in Dextrose 5% (CEFOTAN) IVPB 2 g     2 g Intravenous  Once 12/10/16 1611 12/10/16 1653   12/10/16 1600  cefoTEtan (CEFOTAN) 2 g in dextrose 5 % 50 mL IVPB  Status:  Discontinued     2 g 100 mL/hr over 30 Minutes Intravenous  Once 12/10/16 1559 12/10/16 1611       Yvan Dority J 12/12/2016

## 2016-12-13 LAB — CBC
HCT: 30.4 % — ABNORMAL LOW (ref 36.0–46.0)
Hemoglobin: 9.8 g/dL — ABNORMAL LOW (ref 12.0–15.0)
MCH: 30.2 pg (ref 26.0–34.0)
MCHC: 32.2 g/dL (ref 30.0–36.0)
MCV: 93.8 fL (ref 78.0–100.0)
PLATELETS: 183 10*3/uL (ref 150–400)
RBC: 3.24 MIL/uL — ABNORMAL LOW (ref 3.87–5.11)
RDW: 12.4 % (ref 11.5–15.5)
WBC: 8.6 10*3/uL (ref 4.0–10.5)

## 2016-12-13 LAB — BASIC METABOLIC PANEL
ANION GAP: 9 (ref 5–15)
BUN: 8 mg/dL (ref 6–20)
CALCIUM: 8.1 mg/dL — AB (ref 8.9–10.3)
CO2: 22 mmol/L (ref 22–32)
CREATININE: 0.47 mg/dL (ref 0.44–1.00)
Chloride: 108 mmol/L (ref 101–111)
Glucose, Bld: 72 mg/dL (ref 65–99)
Potassium: 4 mmol/L (ref 3.5–5.1)
SODIUM: 139 mmol/L (ref 135–145)

## 2016-12-13 MED ORDER — ONDANSETRON HCL 4 MG/2ML IJ SOLN: 4.0000 mg | Freq: Four times a day (QID) | INTRAMUSCULAR | Status: DC | PRN

## 2016-12-13 MED ORDER — MORPHINE SULFATE (PF) 2 MG/ML IV SOLN
1.0000 mg | INTRAVENOUS | Status: DC | PRN
  Administered 2016-12-13 – 2016-12-14 (×4): 2 mg via INTRAVENOUS
  Filled 2016-12-13 (×4): qty 1

## 2016-12-13 MED ORDER — ACETAMINOPHEN 325 MG PO TABS
650.0000 mg | ORAL_TABLET | Freq: Four times a day (QID) | ORAL | Status: DC | PRN
  Administered 2016-12-14: 650 mg via ORAL
  Filled 2016-12-13: qty 2

## 2016-12-13 NOTE — Progress Notes (Signed)
3 Days Post-Op   Subjective/Chief Complaint: Complains of some soreness. No real flatus yet   Objective: Vital signs in last 24 hours: Temp:  [98.3 F (36.8 C)-99 F (37.2 C)] 98.5 F (36.9 C) (07/28 0445) Pulse Rate:  [76-84] 82 (07/28 0445) Resp:  [14-23] 18 (07/28 0542) BP: (102-118)/(45-60) 116/56 (07/28 0445) SpO2:  [93 %-100 %] 93 % (07/28 0542) Last BM Date: 12/09/16  Intake/Output from previous day: 07/27 0701 - 07/28 0700 In: 2588.3 [I.V.:2368.3; IV Piggyback:220] Out: 1925 [Urine:1625; Stool:300] Intake/Output this shift: No intake/output data recorded.  General appearance: alert and cooperative Resp: clear to auscultation bilaterally Cardio: regular rate and rhythm GI: soft, mild tenderness  Lab Results:   Recent Labs  12/11/16 0536 12/13/16 0517  WBC 16.4* 8.6  HGB 11.5* 9.8*  HCT 35.8* 30.4*  PLT 207 183   BMET  Recent Labs  12/11/16 0536 12/13/16 0517  NA 139 139  K 4.1 4.0  CL 109 108  CO2 23 22  GLUCOSE 138* 72  BUN 10 8  CREATININE 0.54 0.47  CALCIUM 8.3* 8.1*   PT/INR No results for input(s): LABPROT, INR in the last 72 hours. ABG No results for input(s): PHART, HCO3 in the last 72 hours.  Invalid input(s): PCO2, PO2  Studies/Results: No results found.  Anti-infectives: Anti-infectives    Start     Dose/Rate Route Frequency Ordered Stop   12/10/16 1623  cefoTEtan in Dextrose 5% (CEFOTAN) 2-2.08 GM-% IVPB    Comments:  Dellie Catholic   : cabinet override      12/10/16 1623 12/10/16 1653   12/10/16 1615  cefoTEtan in Dextrose 5% (CEFOTAN) IVPB 2 g     2 g Intravenous  Once 12/10/16 1611 12/10/16 1653   12/10/16 1600  cefoTEtan (CEFOTAN) 2 g in dextrose 5 % 50 mL IVPB  Status:  Discontinued     2 g 100 mL/hr over 30 Minutes Intravenous  Once 12/10/16 1559 12/10/16 1611      Assessment/Plan: s/p Procedure(s): LAPAROSCOPY DIAGNOSTIC (N/A) POSSIBLE EXPLORATORY LAPAROTOMY, POSSIBLE SMALL BOWEL RESECTION (N/A) Continue bowel  rest until bowel functiion returns  Ambulate POD 3 right colectomy  LOS: 3 days    TOTH III,Dejanique Ruehl S 12/13/2016

## 2016-12-14 NOTE — Progress Notes (Signed)
4 Days Post-Op   Subjective/Chief Complaint: Feels better than yesterday. Still has some nausea and hasn't taken much orally   Objective: Vital signs in last 24 hours: Temp:  [98.6 F (37 C)-99 F (37.2 C)] 98.6 F (37 C) (07/29 0526) Pulse Rate:  [77-80] 77 (07/29 0526) Resp:  [16-17] 16 (07/29 0526) BP: (109-136)/(59-80) 125/65 (07/29 0526) SpO2:  [96 %] 96 % (07/29 0526) Last BM Date: 12/13/16  Intake/Output from previous day: 07/28 0701 - 07/29 0700 In: 3154.2 [P.O.:600; I.V.:2444.2; IV Piggyback:110] Out: 702 [Urine:700; Stool:2] Intake/Output this shift: Total I/O In: -  Out: 300 [Urine:300]  General appearance: alert and cooperative Resp: clear to auscultation bilaterally Cardio: regular rate and rhythm GI: soft, mild tenderness. incision ok. good bs  Lab Results:   Recent Labs  12/13/16 0517  WBC 8.6  HGB 9.8*  HCT 30.4*  PLT 183   BMET  Recent Labs  12/13/16 0517  NA 139  K 4.0  CL 108  CO2 22  GLUCOSE 72  BUN 8  CREATININE 0.47  CALCIUM 8.1*   PT/INR No results for input(s): LABPROT, INR in the last 72 hours. ABG No results for input(s): PHART, HCO3 in the last 72 hours.  Invalid input(s): PCO2, PO2  Studies/Results: No results found.  Anti-infectives: Anti-infectives    Start     Dose/Rate Route Frequency Ordered Stop   12/10/16 1623  cefoTEtan in Dextrose 5% (CEFOTAN) 2-2.08 GM-% IVPB    Comments:  Dellie Catholic   : cabinet override      12/10/16 1623 12/10/16 1653   12/10/16 1615  cefoTEtan in Dextrose 5% (CEFOTAN) IVPB 2 g     2 g Intravenous  Once 12/10/16 1611 12/10/16 1653   12/10/16 1600  cefoTEtan (CEFOTAN) 2 g in dextrose 5 % 50 mL IVPB  Status:  Discontinued     2 g 100 mL/hr over 30 Minutes Intravenous  Once 12/10/16 1559 12/10/16 1611      Assessment/Plan: s/p Procedure(s): LAPAROSCOPY DIAGNOSTIC (N/A) POSSIBLE EXPLORATORY LAPAROTOMY, POSSIBLE SMALL BOWEL RESECTION (N/A) Stay with clears until she feels like she  can tolerate it  Ambulate POD 4 right colectomy  LOS: 4 days    TOTH III,Lukasz Rogus S 12/14/2016

## 2016-12-15 MED ORDER — METHOCARBAMOL 500 MG PO TABS: 500.0000 mg | ORAL_TABLET | Freq: Three times a day (TID) | ORAL | Status: DC | PRN

## 2016-12-15 MED ORDER — OXYCODONE HCL 5 MG PO TABS
5.0000 mg | ORAL_TABLET | ORAL | Status: DC | PRN
  Administered 2016-12-15: 5 mg via ORAL
  Filled 2016-12-15: qty 1

## 2016-12-15 MED ORDER — MORPHINE SULFATE (PF) 2 MG/ML IV SOLN
1.0000 mg | Freq: Four times a day (QID) | INTRAVENOUS | Status: DC | PRN
  Administered 2016-12-15: 2 mg via INTRAVENOUS
  Filled 2016-12-15: qty 1

## 2016-12-15 MED ORDER — POTASSIUM CHLORIDE IN NACL 20-0.9 MEQ/L-% IV SOLN
INTRAVENOUS | Status: DC
  Administered 2016-12-15: 1000 mL via INTRAVENOUS
  Filled 2016-12-15 (×2): qty 1000

## 2016-12-15 NOTE — Discharge Instructions (Signed)
[facility], [place] [phone]  OPEN ABDOMINAL SURGERY: POST OP INSTRUCTIONS  Always review your discharge instruction sheet given to you by the facility where your surgery was performed.  IF YOU HAVE DISABILITY OR FAMILY LEAVE FORMS, YOU MUST BRING THEM TO THE OFFICE FOR PROCESSING.  PLEASE DO NOT GIVE THEM TO YOUR DOCTOR.  1. A prescription for pain medication may be given to you upon discharge.  Take your pain medication as prescribed, if needed.  If narcotic pain medicine is not needed, then you may take acetaminophen (Tylenol) or ibuprofen (Advil) as needed. 2. Take your usually prescribed medications unless otherwise directed. 3. If you need a refill on your pain medication, please contact your pharmacy. They will contact our office to request authorization.  Prescriptions will not be filled after 5pm or on week-ends. 4. You should follow a light diet the first few days after arrival home, such as soup and crackers, pudding, etc.unless your doctor has advised otherwise. A high-fiber, low fat diet can be resumed as tolerated.   Be sure to include lots of fluids daily. Most patients will experience some swelling and bruising on the chest and neck area.  Ice packs will help.  Swelling and bruising can take several days to resolve 5. Most patients will experience some swelling and bruising in the area of the incision. Ice pack will help. Swelling and bruising can take several days to resolve..  6. It is common to experience some constipation if taking pain medication after surgery.  Increasing fluid intake and taking a stool softener will usually help or prevent this problem from occurring.  A mild laxative (Milk of Magnesia or Miralax) should be taken according to package directions if there are no bowel movements after 48 hours. 7.  You may have steri-strips (small skin tapes) in place directly over the incision.  These strips should be left on the skin for 7-10 days.  If your  surgeon used skin glue on the incision, you may shower in 24 hours.  The glue will flake off over the next 2-3 weeks.  Any sutures or staples will be removed at the office during your follow-up visit. You may find that a light gauze bandage over your incision may keep your staples from being rubbed or pulled. You may shower and replace the bandage daily. 8. ACTIVITIES:  You may resume regular (light) daily activities beginning the next day--such as daily self-care, walking, climbing stairs--gradually increasing activities as tolerated.  You may have sexual intercourse when it is comfortable.  Refrain from any heavy lifting or straining until approved by your doctor. a. You may drive when you no longer are taking prescription pain medication, you can comfortably wear a seatbelt, and you can safely maneuver your car and apply brakes b. Return to Work: out of work until follow-up 9. You should see your doctor in the office for a follow-up appointment approximately two weeks after your surgery.  Make sure that you call for this appointment within a day or two after you arrive home to insure a convenient appointment time. OTHER INSTRUCTIONS:  _____________________________________________________________ _____________________________________________________________  WHEN TO CALL YOUR DOCTOR: 1. Fever over 101.0 2. Inability to urinate 3. Nausea and/or vomiting 4. Extreme swelling or bruising 5. Continued bleeding from incision. 6. Increased pain, redness, or drainage from the incision. 7. Difficulty swallowing or breathing 8. Muscle cramping or spasms. 9. Numbness or tingling in hands or feet or around lips.  The clinic  staff is available to answer your questions during regular business hours.  Please dont hesitate to call and ask to speak to one of the nurses if you have concerns.  For further questions, please visit [email or web address]

## 2016-12-15 NOTE — Progress Notes (Signed)
Patient ID: [name], female   DOB: 1950-08-02, 66 y.o.   MRN: [id number]  [facility] Progress Note  5 Days Post-Op  Subjective: CC- right colon volvulus Patient states that she feels the best she has felt in several days. She had no n/v yesterday. Tolerating clear liquids. Passing flatus and having loose BMs. Pain has significantly improved over the last 24 hours.  Objective: Vital signs in last 24 hours: Temp:  [98.6 F (37 C)-99 F (37.2 C)] 98.6 F (37 C) (07/30 0511) Pulse Rate:  [66-73] 73 (07/30 0511) Resp:  [16-17] 16 (07/30 0511) BP: (116-132)/(56-63) 116/62 (07/30 0511) SpO2:  [96 %-99 %] 97 % (07/30 0511) Last BM Date: 12/14/16  Intake/Output from previous day: 07/29 0701 - 07/30 0700 In: 2880 [P.O.:480; I.V.:2400] Out: 1200 [Urine:1200] Intake/Output this shift: Total I/O In: 160 [P.O.:160] Out: -   PE: Gen:  Alert, NAD, pleasant HEENT: EOM's intact, pupils equal and round Card:  RRR, no M/G/R heard Pulm:  CTAB, no W/R/R, effort normal Abd: Soft, mild distension, +BS in all 4 quadrants, midline incision C/D/I >> honeycomb dressing removed Ext:  No erythema, edema, or tenderness BUE/BLE  Psych: A&Ox3  Skin: no rashes noted, warm and dry  Lab Results:   Recent Labs  12/13/16 0517  WBC 8.6  HGB 9.8*  HCT 30.4*  PLT 183   BMET  Recent Labs  12/13/16 0517  NA 139  K 4.0  CL 108  CO2 22  GLUCOSE 72  BUN 8  CREATININE 0.47  CALCIUM 8.1*   PT/INR No results for input(s): LABPROT, INR in the last 72 hours. CMP     Component Value Date/Time   NA 139 12/13/2016 0517   K 4.0 12/13/2016 0517   CL 108 12/13/2016 0517   CO2 22 12/13/2016 0517   GLUCOSE 72 12/13/2016 0517   BUN 8 12/13/2016 0517   CREATININE 0.47 12/13/2016 0517   CREATININE 0.68 11/09/2015 1032   CALCIUM 8.1 (L) 12/13/2016 0517   PROT 6.9 12/10/2016 1217   ALBUMIN 4.2 12/10/2016 1217   AST 18 12/10/2016 1217   ALT 14 12/10/2016 1217   ALKPHOS 61  12/10/2016 1217   BILITOT 0.2 (L) 12/10/2016 1217   GFRNONAA >60 12/13/2016 0517   GFRNONAA >89 11/09/2015 1032   GFRAA >60 12/13/2016 0517   GFRAA >89 11/09/2015 1032   Lipase     Component Value Date/Time   LIPASE 30 12/10/2016 1217       Studies/Results: No results found.  Anti-infectives: Anti-infectives    Start     Dose/Rate Route Frequency Ordered Stop   12/10/16 1623  cefoTEtan in Dextrose 5% (CEFOTAN) 2-2.08 GM-% IVPB    Comments:  Dellie Catholic   : cabinet override      12/10/16 1623 12/10/16 1653   12/10/16 1615  cefoTEtan in Dextrose 5% (CEFOTAN) IVPB 2 g     2 g Intravenous  Once 12/10/16 1611 12/10/16 1653   12/10/16 1600  cefoTEtan (CEFOTAN) 2 g in dextrose 5 % 50 mL IVPB  Status:  Discontinued     2 g 100 mL/hr over 30 Minutes Intravenous  Once 12/10/16 1559 12/10/16 1611       Assessment/Plan Nephrolithiasis Tobacco abuse  Right colon volvulus S/p LAPAROSCOPY DIAGNOSTIC, EXPLORATORY LAPAROTOMY, RIGHT  ILEOCOLECTOMY 7/25 Dr. Ninfa Linden - POD 5 - surgical pathology pending - tolerating clears and having bowel function  ID - cefotetan perioperative FEN - IVF, full liquids VTE - SCDs, lovenox  Plan - advance to full liquids. Continue to encourage ambulation. Add oral pain medications. May be ready for discharge in the next 1-2 days.   LOS: 5 days    Wellington Hampshire , [facility] Surgery 12/15/2016, 8:37 AM Pager: [phone] Consults: [phone] Mon-Fri 7:00 am-4:30 pm Sat-Sun 7:00 am-11:30 am

## 2016-12-16 MED ORDER — OXYCODONE HCL 5 MG PO TABS: 5.0000 mg | ORAL_TABLET | ORAL | 0 refills | Status: DC | PRN

## 2016-12-16 MED ORDER — POLYETHYLENE GLYCOL 3350 17 G PO PACK
17.0000 g | PACK | Freq: Every day | ORAL | Status: DC
  Filled 2016-12-16: qty 1

## 2016-12-16 MED ORDER — OXYCODONE HCL 5 MG PO TABS: 5.0000 mg | ORAL_TABLET | ORAL | Status: DC | PRN

## 2016-12-16 MED ORDER — DOCUSATE SODIUM 100 MG PO CAPS
100.0000 mg | ORAL_CAPSULE | Freq: Every day | ORAL | Status: DC
  Administered 2016-12-16: 100 mg via ORAL
  Filled 2016-12-16: qty 1

## 2016-12-16 NOTE — Progress Notes (Signed)
[facility]/Trauma Progress Note  6 Days Post-Op   Assessment/Plan Nephrolithiasis Tobacco abuse  Right colon volvulus S/p LAPAROSCOPY DIAGNOSTIC, EXPLORATORY LAPAROTOMY, RIGHT ILEOCOLECTOMY 7/25 Dr. Ninfa Linden - surgical pathology showed WELL-DIFFERENTIATED NEUROENDOCRINE NEOPLASM of appendix, 8 benign lymph nodes and colon margins not involved by neoplasm. Gave pt a copy of path report.   ID - cefotetan perioperative FEN - soft diet VTE - SCDs, lovenox Follow up: Dr. Ninfa Linden 2-3 weeks  Plan - soft diet, if tolerates will discharge home this afternoon. Will ask Dr. Harlow Asa to explain path in more detail with pt.   LOS: 6 days    Subjective:  CC: abdominal pain  Pt states mild abdominal pain. Tolerating diet. No vomiting. Had a small BM yesterday. Lots of flatus. No acute events overnight. Pt asked about path. I gave her the path report and explained that Dr. Harlow Asa will explain it in more detail when he sees her today.   Objective: Vital signs in last 24 hours: Temp:  [98.3 F (36.8 C)-99.3 F (37.4 C)] 98.9 F (37.2 C) (07/31 0552) Pulse Rate:  [77-80] 77 (07/31 0552) Resp:  [15-16] 15 (07/31 0552) BP: (107-128)/(47-60) 107/47 (07/31 0552) SpO2:  [96 %-97 %] 96 % (07/31 0552) Last BM Date: 12/15/16  Intake/Output from previous day: 07/30 0701 - 07/31 0700 In: 2217.5 [P.O.:1020; I.V.:1197.5] Out: 1100 [Urine:1100] Intake/Output this shift: No intake/output data recorded.  PE: Gen:  Alert, NAD, pleasant, cooperative, well appearing Card:  RRR, no M/G/R heard Pulm:  CTA anteriorly, no W/R/R, effort normal Abd: Soft, not distended, +BS, incisions appear well healing, no signs of infection, area of ecchymosis around umbilicus incision Skin: no rashes noted, warm and dry   Anti-infectives: Anti-infectives    Start     Dose/Rate Route Frequency Ordered Stop   12/10/16 1623  cefoTEtan in Dextrose 5% (CEFOTAN) 2-2.08 GM-% IVPB    Comments:  [place],  Ron   : cabinet override      12/10/16 1623 12/10/16 1653   12/10/16 1615  cefoTEtan in Dextrose 5% (CEFOTAN) IVPB 2 g     2 g Intravenous  Once 12/10/16 1611 12/10/16 1653   12/10/16 1600  cefoTEtan (CEFOTAN) 2 g in dextrose 5 % 50 mL IVPB  Status:  Discontinued     2 g 100 mL/hr over 30 Minutes Intravenous  Once 12/10/16 1559 12/10/16 1611      Lab Results:  No results for input(s): WBC, HGB, HCT, PLT in the last 72 hours. BMET No results for input(s): NA, K, CL, CO2, GLUCOSE, BUN, CREATININE, CALCIUM in the last 72 hours. PT/INR No results for input(s): LABPROT, INR in the last 72 hours. CMP     Component Value Date/Time   NA 139 12/13/2016 0517   K 4.0 12/13/2016 0517   CL 108 12/13/2016 0517   CO2 22 12/13/2016 0517   GLUCOSE 72 12/13/2016 0517   BUN 8 12/13/2016 0517   CREATININE 0.47 12/13/2016 0517   CREATININE 0.68 11/09/2015 1032   CALCIUM 8.1 (L) 12/13/2016 0517   PROT 6.9 12/10/2016 1217   ALBUMIN 4.2 12/10/2016 1217   AST 18 12/10/2016 1217   ALT 14 12/10/2016 1217   ALKPHOS 61 12/10/2016 1217   BILITOT 0.2 (L) 12/10/2016 1217   GFRNONAA >60 12/13/2016 0517   GFRNONAA >89 11/09/2015 1032   GFRAA >60 12/13/2016 0517   GFRAA >89 11/09/2015 1032   Lipase     Component Value Date/Time   LIPASE 30 12/10/2016 1217  Studies/Results: No results found.    Kalman Drape , [facility] Surgery 12/16/2016, 8:20 AM Pager: [phone] Consults: [phone] Mon-Fri 7:00 am-4:30 pm Sat-Sun 7:00 am-11:30 am

## 2016-12-16 NOTE — Discharge Summary (Signed)
[facility]/Trauma Discharge Summary   Patient ID: [name] MRN: [id number] DOB/AGE: February 06, 1951 66 y.o.  Admit date: 12/10/2016 Discharge date: 12/16/2016  Admitting Diagnosis: Acute abdomen Right colonic volvulus  Discharge Diagnosis Patient Active Problem List   Diagnosis Date Noted  . 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 Routine general medical examination at a health care facility 03/10/2011  . Internal and external hemorrhoids, internal with prolapse and bleeding. 03/10/2011  . DIVERTICULITIS OF COLON 11/07/2009  . ABDOMINAL PAIN, LOWER 10/22/2009  . OTALGIA 06/12/2008  . TOBACCO ABUSE 08/16/2007  . RENAL CALCULUS 08/16/2007  . Hyperlipidemia 08/09/2007  . ABDOMINAL PAIN, UNSPECIFIED SITE 08/09/2007    Consultants none  Imaging: No results found.  Procedures Dr. Ninfa Linden (12/10/16) - Laparoscopic diagnostic, exploratory laparotomy, right ileocolectomy  Hospital Course:  [name] is a 66 year old female who presented to ED with abdominal pain.  Workup showed right colonic volvulus.  Patient was admitted and underwent procedure listed above.  Tolerated procedure well and was transferred to the floor.  Diet was advanced as tolerated.  On POD#6, the patient was voiding well, tolerating diet, ambulating well, pain well controlled, vital signs stable, incisions c/d/i and felt stable for discharge home. Pathology was discussed with pt by Dr. Harlow Asa. Patient will follow up in our office in 2 weeks with Dr. Ninfa Linden and knows to call with questions or concerns. At that appointment, it will be discussed with pt if she needs to follow up with oncology. She will call to confirm appointment date/time with Dr. Ninfa Linden.    Patient was discharged in good condition.  The [place] Substance controlled database was reviewed prior to prescribing narcotic pain medication to this patient.  Physical Exam: Gen:  Alert,  NAD, pleasant, cooperative, well appearing Card:  RRR, no M/G/R heard Pulm:  CTA anteriorly, no W/R/R, effort normal Abd: Soft, not distended, +BS, incisions appear well healing, no signs of infection, area of ecchymosis around umbilicus incision Skin: no rashes noted, warm and dry  Allergies as of 12/16/2016   No Known Allergies     Medication List    TAKE these medications   albuterol 108 (90 Base) MCG/ACT inhaler Commonly known as:  PROVENTIL HFA;VENTOLIN HFA Inhale 2 puffs into the lungs every 6 (six) hours as needed for wheezing or shortness of breath (cough).   azelastine 0.1 % nasal spray Commonly known as:  ASTELIN Place 2 sprays into both nostrils 2 (two) times daily. Use in each nostril as directed   meclizine 25 MG tablet Commonly known as:  ANTIVERT Take 1 tablet (25 mg total) by mouth 3 (three) times daily as needed for dizziness.   multivitamin with minerals Tabs tablet Take 1 tablet by mouth daily.   oxyCODONE 5 MG immediate release tablet Commonly known as:  Oxy IR/ROXICODONE Take 1 tablet (5 mg total) by mouth every 4 (four) hours as needed for moderate pain or severe pain (5- moderate (if response to Tylenol has been insufficient), 10- severe pain).   predniSONE 20 MG tablet Commonly known as:  DELTASONE 3 tabs po daily x 3 days, then 2 tabs x 3 days, then 1.5 tabs x 3 days, then 1 tab x 3 days, then 0.5 tabs x 3 days   promethazine-dextromethorphan 6.25-15 MG/5ML syrup Commonly known as:  PROMETHAZINE-DM Take 5-10 ML PO q8hrs prn for cough and congestion        Follow-up Information    Coralie Keens, MD. Call in 2  week(s).   Specialty:  General Surgery Why:  We are working on your appointment, please call to confirm. Please arrive 30 minutes prior to your appointment time to check in and fill out necessary paperwork. Contact information: [address] [place] [address] [phone]           Signed: [facility] 12/16/2016, 11:18 AM Pager: [phone] Consults: [phone] Mon-Fri 7:00 am-4:30 pm Sat-Sun 7:00 am-11:30 am

## 2016-12-16 NOTE — Progress Notes (Signed)
Pt was discharged home today. Instructions were reviewed with patient,prescription was given to patient and questions were answered. Pt was taken to main entrance via wheelchair by NT.

## 2017-01-02 ENCOUNTER — Telehealth: Payer: Self-pay | Admitting: Oncology

## 2017-01-02 ENCOUNTER — Encounter: Payer: Self-pay | Admitting: Oncology

## 2017-01-02 NOTE — Telephone Encounter (Signed)
Pt has been scheduled to see Dr. Hillery Aldo on 9/6 at 2pm. Letter mailed to the pt.

## 2017-01-22 ENCOUNTER — Ambulatory Visit (HOSPITAL_BASED_OUTPATIENT_CLINIC_OR_DEPARTMENT_OTHER): Payer: PRIVATE HEALTH INSURANCE | Admitting: Oncology

## 2017-01-22 VITALS — BP 123/52 | HR 82 | Temp 98.3°F | Resp 18 | Ht 59.0 in | Wt 104.4 lb

## 2017-01-22 DIAGNOSIS — D3A020 Benign carcinoid tumor of the appendix: Secondary | ICD-10-CM

## 2017-01-22 NOTE — Progress Notes (Signed)
[facility] Patient Consult   Referring MD: Unk Pinto, [place] [address] [place], [place] [address]   KRYSTA BLOOMFIELD 66 y.o.  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 A CT of the abdomen/pelvis was ordered to Dr. Ernesta Amble. This was compared to a CT from 03/02/2013. A 3 mm nonobstructive calculus was noted in the right kidney. No pathologic dilatation of small bowel or colon. The jejunum was noted in the right side of the abdomen, new from 2014. Colonic diverticuli were noted without surrounding inflammatory change. The appendix appeared normal. The cecum was noted to extend into the left upper quadrant. No adenopathy.  She was referred to Dr. Rush Farmer and was taken to the operating room on 12/10/2016 for a diagnostic laparoscopy, expiratory laparotomy, and right ileocolectomy. A volvulus of the right colon was noted with the cecum resting in the left upper quadrant. The small bowel mesentery was twisted. No ischemic bowel. An extended right ileocolectomy with primary anastomosis was performed. No additional pathology.  The pathology ([id number]) revealed patchy submucosal hemorrhage of the colon. A well differentiated neuroendocrine neoplasm was noted in the appendix with 8 benign lymph nodes. The tumor measured 0.5 cm and involved the distal half of the appendix. Tumor invaded the muscular propria. The resection margins were negative. No lymphovascular or perineural invasion.  She has recovered from surgery. Her bowels are functioning.  Past Medical History:  Diagnosis Date  . Arthritis   . Hemorrhoids   . History of diverticulitis of colon   . Hyperlipidemia   . Kidney stones     .  Right colonic volvulus 12/10/2016   .  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  G1 P1  Past Surgical History:    Procedure Laterality Date  . BREAST CYST EXCISION     X2  . COLONOSCOPY  nov 2011  . KNEE SURGERY  07/2006   right  . LAPAROSCOPY N/A 12/10/2016   Procedure: LAPAROSCOPY DIAGNOSTIC;  Surgeon: Coralie Keens, MD;  Location: [facility];  Service: General;  Laterality: N/A;  . LAPAROTOMY N/A 12/10/2016   Procedure: POSSIBLE EXPLORATORY LAPAROTOMY, POSSIBLE SMALL BOWEL RESECTION;  Surgeon: Coralie Keens, MD;  Location: [facility];  Service: General;  Laterality: N/A;  . VAGINAL HYSTERECTOMY     total    Medications: Reviewed  Allergies: No Known Allergies  Family history: No family history of cancer  Social History:   She lives with her significant other in [place]. She works in a [employer]. She smokes approximately 3 cigarettes per day. She is smoked for the past 20 years. No alcohol use. No transfusion history. No risk factor for HIV or hepatitis.  ROS:   Positives include: Loose stool in the mornings-she takes a stool softener  A complete ROS was otherwise negative.  Physical Exam:  Blood pressure (!) 123/52, pulse 82, temperature 98.3 F (36.8 C), temperature source Oral, resp. rate 18, height 4\' 11"  (1.499 m), weight 104 lb 6.4 oz (47.4 kg), SpO2 99 %.  HEENT: Oral cavity without visible mass, neck without mass Lungs: Distant breath sounds, no respiratory distress Cardiac: Regular rate and rhythm Abdomen: No hepatosplenomegaly, healed midline incision, no mass  Vascular: No leg edema Lymph nodes: No cervical, supraclavicular, right axillary, or inguinal nodes. Pea-sized mobile left axillary node Breasts: Left breast without mass Neurologic:  The motor exam is intact in the upper and lower extremities Skin: No rash   Imaging: As per history of present illness   Assessment/Plan:   1. 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 Right colon volvulus 12/10/2016, status post an exploratory laparotomy and right ileocolectomy  3.   History of diverticulitis   Disposition:   Ms. Bhattacharyya has been diagnosed with a low-grade neuroendocrine tumor of the appendix. She has early-stage disease. The tumor was an incidental finding at the time of a right ileocolectomy for management of a right colon volvulus.  She has a good prognosis for a long-term disease-free survival. I do not recommend surveillance imaging or laboratory studies.  She will continue clinical follow-up with Dr.Mckeown. Ms. Fleeman is not scheduled for a follow-up appointment at the [facility]. I am available to see her in the future as needed.  50 minutes were spent with the patient today. The majority of the time was used for counseling and coordination of care.   Donneta Romberg, MD  01/22/2017, 2:16 PM

## 2017-01-23 ENCOUNTER — Telehealth: Payer: Self-pay

## 2017-01-23 NOTE — Telephone Encounter (Signed)
To be announced per 9/7 los.

## 2017-02-23 ENCOUNTER — Encounter: Payer: Self-pay | Admitting: Oncology

## 2017-03-19 ENCOUNTER — Encounter: Payer: Self-pay | Admitting: Physician Assistant

## 2017-03-19 ENCOUNTER — Ambulatory Visit (INDEPENDENT_AMBULATORY_CARE_PROVIDER_SITE_OTHER): Payer: No Typology Code available for payment source | Admitting: Physician Assistant

## 2017-03-19 VITALS — BP 126/80 | HR 71 | Temp 97.5°F | Resp 14 | Ht 59.0 in | Wt 105.8 lb

## 2017-03-19 DIAGNOSIS — Z79899 Other long term (current) drug therapy: Secondary | ICD-10-CM | POA: Diagnosis not present

## 2017-03-19 DIAGNOSIS — Z1329 Encounter for screening for other suspected endocrine disorder: Secondary | ICD-10-CM

## 2017-03-19 DIAGNOSIS — I10 Essential (primary) hypertension: Secondary | ICD-10-CM

## 2017-03-19 DIAGNOSIS — Z0001 Encounter for general adult medical examination with abnormal findings: Secondary | ICD-10-CM

## 2017-03-19 DIAGNOSIS — Z6821 Body mass index (BMI) 21.0-21.9, adult: Secondary | ICD-10-CM

## 2017-03-19 DIAGNOSIS — E785 Hyperlipidemia, unspecified: Secondary | ICD-10-CM

## 2017-03-19 DIAGNOSIS — Z0000 Encounter for general adult medical examination without abnormal findings: Secondary | ICD-10-CM | POA: Diagnosis not present

## 2017-03-19 DIAGNOSIS — Z86012 Personal history of benign carcinoid tumor: Secondary | ICD-10-CM

## 2017-03-19 DIAGNOSIS — Z136 Encounter for screening for cardiovascular disorders: Secondary | ICD-10-CM

## 2017-03-19 DIAGNOSIS — E559 Vitamin D deficiency, unspecified: Secondary | ICD-10-CM | POA: Diagnosis not present

## 2017-03-19 DIAGNOSIS — Z130 Encounter for screening for diseases of the blood and blood-forming organs and certain disorders involving the immune mechanism: Secondary | ICD-10-CM

## 2017-03-19 DIAGNOSIS — N200 Calculus of kidney: Secondary | ICD-10-CM

## 2017-03-19 DIAGNOSIS — K562 Volvulus: Secondary | ICD-10-CM

## 2017-03-19 DIAGNOSIS — Z1389 Encounter for screening for other disorder: Secondary | ICD-10-CM

## 2017-03-19 DIAGNOSIS — K644 Residual hemorrhoidal skin tags: Secondary | ICD-10-CM

## 2017-03-19 DIAGNOSIS — F17200 Nicotine dependence, unspecified, uncomplicated: Secondary | ICD-10-CM

## 2017-03-19 MED ORDER — LIDOCAINE VISCOUS 2 % MT SOLN: 20.0000 mL | OROMUCOSAL | 3 refills | Status: DC | PRN

## 2017-03-19 NOTE — Patient Instructions (Addendum)
Your LDL is not in range or at goal.  Your LDL is the bad cholesterol that can lead to heart attack and stroke. To lower your number you can decrease your fatty foods, red meat, cheese, milk and increase fiber like whole grains and veggies. You can also add a fiber supplement like Citracel or Benefiber, these do not cause gas and bloating and are safe to use.    [facility]  7 a.m.-6:30 p.m., Monday 7 a.m.-5 p.m., Tuesday-Friday Schedule an appointment by calling [phone].   VAGINAL DRYNESS OVERVIEW  Vaginal dryness, also known as atrophic vaginitis, is a common condition in postmenopausal women. This condition is also common in women who have had both ovaries removed at the time of hysterectomy.   Some women have uncomfortable symptoms of vaginal dryness, such as pain with sex, burning vaginal discomfort or itching, or abnormal vaginal discharge, while others have no symptoms at all.  VAGINAL DRYNESS CAUSES   Estrogen helps to keep the vagina moist and to maintain thickness of the vaginal lining. Vaginal dryness occurs when the ovaries produce a decreased amount of estrogen. This can occur at certain times in a woman's life, and may be permanent or temporary. Times when less estrogen is made include: ?At the time of menopause. ?After surgical removal of the ovaries, chemotherapy, or radiation therapy of the pelvis for cancer. ?After having a baby, particularly in women who breastfeed. ?While using certain medications, such as danazol, medroxyprogesterone (brand names: Provera or DepoProvera), leuprolide (brand name: Lupron), or nafarelin. When these medications are stopped, estrogen production resumes.  Women who smoke cigarettes have been shown to have an increased risk of an earlier menopause transition as compared to non-smokers. Therefore, atrophic vaginitis symptoms may appear at a younger age in this population.  VAGINAL DRYNESS TREATMENT   There are  three treatment options for women with vaginal dryness:  Vaginal lubricants and moisturizers - Vaginal lubricants and moisturizers can be purchased without a prescription. These products do not contain any hormones and have virtually no side effects. - Albolene is found in the facial cleanser section at [facility], [facility], or [employer]. It is a large jar with a blue top. This is the best lubricant for women because it is hypoallergenic. -Natural lubricants, such as olive, avocado or peanut oil, are easily available products that may be used as a lubricant with sex.  -Vaginal moisturizes (eg, Replens, Moist Again, Vagisil, K-Y Silk-E, and Feminease) are formulated to allow water to be retained in the vaginal tissues. Moisturizers are applied into the vagina three times weekly to allow a continued moisturizing effect. These should not be used just before having sex, as they can be irritating.  Can use hydrocortisone afterwards very small amount   Vaginal estrogen - Vaginal estrogen is the most effective treatment option for women with vaginal dryness. Vaginal estrogen must be prescribed by a healthcare provider. Very low doses of vaginal estrogen can be used when it is put into the vagina to treat vaginal dryness. A small amount of estrogen is absorbed into the bloodstream, but only about 100 times less than when using estrogen pills or tablets. As a result, there is a much lower risk of side effects, such as blood clots, breast cancer, and heart attack, compared with other estrogen-containing products (birth control pills, menopausal hormone therapy).   Ospemifene - Ospemifene is a prescription medication that is similar to estrogen, but is not estrogen. In the vaginal tissue, it acts similarly  to estrogen. In the breast tissue, it acts as an estrogen blocker. It comes in a pill, and is prescribed for women who want to use an estrogen-like medication for vaginal dryness or painful sex associated with vaginal  dryness, but prefer not to use a vaginal medication. The medication may cause hot flashes as a side effect. This type of medication may increase the risk of blood clots or uterine cancer. Further study of ospemifene is needed to evaluate the risk of these complications. This medication has not been tested in women who have had breast cancer or are at a high risk of developing breast cancer.    Sexual activity - Vaginal estrogen improves vaginal dryness quickly, usually within a few weeks. You may continue to have sex as you treat vaginal dryness because sex itself can help to keep the vaginal tissues healthy. Vaginal intercourse may help the vaginal tissues by keeping them soft and stretchable and preventing the tissues from shrinking.  If sex continues to be painful despite treatment for vaginal dryness, talk to your healthcare provider.    If you have a smart phone, please look up Smoke Free app, this will help you stay on track and give you information about money you have saved, life that you have gained back and a ton of more information.   We are giving you chantix for smoking cessation. You can do it! And we are here to help! You may have heard some scary side effects about chantix, the three most common I hear about are nausea, crazy dreams and depression.  However, I like for my patients to try to stay on 1/2 a tablet twice a day rather than one tablet twice a day as normally prescribed. This helps decrease the chances of side effects and helps save money by making a one month prescription last two months  Please start the prescription this way:  Start 1/2 tablet by mouth once daily after food with a full glass of water for 3 days Then do 1/2 tablet by mouth twice daily for 4 days. During this first week you can smoke, but try to stop after this week.  At this point we have several options: 1) continue on 1/2 tablet twice a day- which I encourage you to do. You can stay on this dose the  rest of the time on the medication or if you still feel the need to smoke you can do one of the two options below. 2) do one tablet in the morning and 1/2 in the evening which helps decrease dreams. 3) do one tablet twice a day.   What if I miss a dose? If you miss a dose, take it as soon as you can. If it is almost time for your next dose, take only that dose. Do not take double or extra doses.  What should I watch for while using this medicine? Visit your doctor or health care professional for regular check ups. Ask for ongoing advice and encouragement from your doctor or healthcare professional, friends, and family to help you quit. If you smoke while on this medication, quit again  Your mouth may get dry. Chewing sugarless gum or hard candy, and drinking plenty of water may help. Contact your doctor if the problem does not go away or is severe.  You may get drowsy or dizzy. Do not drive, use machinery, or do anything that needs mental alertness until you know how this medicine affects you. Do not stand or sit  up quickly, especially if you are an older patient.   The use of this medicine may increase the chance of suicidal thoughts or actions. Pay special attention to how you are responding while on this medicine. Any worsening of mood, or thoughts of suicide or dying should be reported to your health care professional right away.  ADVANTAGES OF QUITTING SMOKING  Within 20 minutes, blood pressure decreases. Your pulse is at normal level.  After 8 hours, carbon monoxide levels in the blood return to normal. Your oxygen level increases.  After 24 hours, the chance of having a heart attack starts to decrease. Your breath, hair, and body stop smelling like smoke.  After 48 hours, damaged nerve endings begin to recover. Your sense of taste and smell improve.  After 72 hours, the body is virtually free of nicotine. Your bronchial tubes relax and breathing becomes easier.  After 2 to 12  weeks, lungs can hold more air. Exercise becomes easier and circulation improves.  After 1 year, the risk of coronary heart disease is cut in half.  After 5 years, the risk of stroke falls to the same as a nonsmoker.  After 10 years, the risk of lung cancer is cut in half and the risk of other cancers decreases significantly.  After 15 years, the risk of coronary heart disease drops, usually to the level of a nonsmoker.  You will have extra money to spend on things other than cigarettes.   Osteoporosis Osteoporosis is the thinning and loss of density in the bones. Osteoporosis makes the bones more brittle, fragile, and likely to break (fracture). Over time, osteoporosis can cause the bones to become so weak that they fracture after a simple fall. The bones most likely to fracture are the bones in the hip, wrist, and spine. What are the causes? The exact cause is not known. What increases the risk? Anyone can develop osteoporosis. You may be at greater risk if you have a family history of the condition or have poor nutrition. You may also have a higher risk if you are:  Female.  43 years old or older.  A smoker.  Not physically active.  White or Asian.  Slender.  What are the signs or symptoms? A fracture might be the first sign of the disease, especially if it results from a fall or injury that would not usually cause a bone to break. Other signs and symptoms include:  Low back and neck pain.  Stooped posture.  Height loss.  How is this diagnosed? To make a diagnosis, your health care provider may:  Take a medical history.  Perform a physical exam.  Order tests, such as: ? A bone mineral density test. ? A dual-energy X-ray absorptiometry test.  How is this treated? The goal of osteoporosis treatment is to strengthen your bones to reduce your risk of a fracture. Treatment may involve:  Making lifestyle changes, such as: ? Eating a diet rich in calcium. ? Doing  weight-bearing and muscle-strengthening exercises. ? Stopping tobacco use. ? Limiting alcohol intake.  Taking medicine to slow the process of bone loss or to increase bone density.  Monitoring your levels of calcium and vitamin D.  Follow these instructions at home:  Include calcium and vitamin D in your diet. Calcium is important for bone health, and vitamin D helps the body absorb calcium.  Perform weight-bearing and muscle-strengthening exercises as directed by your health care provider.  Do not use any tobacco products, including cigarettes, chewing tobacco,  and electronic cigarettes. If you need help quitting, ask your health care provider.  Limit your alcohol intake.  Take medicines only as directed by your health care provider.  Keep all follow-up visits as directed by your health care provider. This is important.  Take precautions at home to lower your risk of falling, such as: ? Keeping rooms well lit and clutter free. ? Installing safety rails on stairs. ? Using rubber mats in the bathroom and other areas that are often wet or slippery. Get help right away if: You fall or injure yourself. This information is not intended to replace advice given to you by your health care provider. Make sure you discuss any questions you have with your health care provider. Document Released: 02/12/2005 Document Revised: 10/08/2015 Document Reviewed: 10/13/2013 Elsevier Interactive Patient Education  2017 [employer].

## 2017-03-19 NOTE — Progress Notes (Signed)
Complete Physical  Assessment and Plan:  Hyperlipidemia, unspecified hyperlipidemia type -continue medications, check lipids, decrease fatty foods, increase activity.  -     Lipid panel  TOBACCO ABUSE Smoking cessation-  instruction/counseling given, counseled patient on the dangers of tobacco use, advised patient to stop smoking, and reviewed strategies to maximize success, patient not ready to quit at this time.  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 No issues at this time  External hemorrhoids with complication Continue fiber  Encounter for general adult medical examination with abnormal findings 1 year DEXA with medicare  Medication management -     CBC with Differential/Platelet -     BASIC METABOLIC PANEL WITH GFR -     Hepatic function panel -     Magnesium  Vitamin D deficiency -     VITAMIN D 25 Hydroxy (Vit-D Deficiency, Fractures)  Screening for thyroid disorder -     TSH  Screening for hematuria or proteinuria -     Urinalysis, Routine w reflex microscopic -     Microalbumin / creatinine urine ratio  BMI 21.0-21.9, adult  Personal history of benign carcinoid tumor Being monitored but at this time it was removed  Screening for deficiency anemia -     Iron,Total/Total Iron Binding Cap -     Vitamin B12   Discussed med's effects and SE's. Screening labs and tests as requested with regular follow-up as recommended.  HPI  66 y.o. female  presents for a complete physical.  Her blood pressure has been controlled at home, today their BP is BP: 126/80.  She does workout. She denies chest pain, shortness of breath, dizziness.  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 She takes colace, no constipation/diarrhea.   She has to use lubrication  for intercourse, afterwards she has some irritation at perineum with urination.  She is not on cholesterol medication and denies myalgias. Her cholesterol is at goal. The cholesterol last visit was:  Lab Results  Component Value Date   CHOL 261 (H) 11/09/2015   HDL 102 11/09/2015   LDLCALC 144 (H) 11/09/2015   LDLDIRECT 166.3 03/05/2011   TRIG 76 11/09/2015   CHOLHDL 2.6 11/09/2015  .  Last A1C in the office was:  Lab Results  Component Value Date   HGBA1C 5.3 11/09/2015   Patient is on Vitamin D supplement.   Lab Results  Component Value Date   VD25OH 38 11/09/2015     BMI is Body mass index is 21.37 kg/m., she is working on diet and exercise. Wt Readings from Last 3 Encounters:  03/19/17 105 lb 12.8 oz (48 kg)  01/22/17 104 lb 6.4 oz (47.4 kg)  12/16/16 112 lb 4.8 oz (50.9 kg)     Current Medications:  Current Outpatient Prescriptions on File Prior to Visit  Medication Sig Dispense Refill  . docusate sodium (COLACE) 100 MG capsule Take 200 mg by mouth at bedtime.     No current facility-administered medications on file prior to visit.     Health Maintenance:   Immunization History  Administered Date(s) Administered  . Pneumococcal Conjugate-13 04/30/2015  . Td 08/02/1996  . 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 Arthritis   . Hemorrhoids   . History of diverticulitis of colon   . Hyperlipidemia   . Kidney stones    Allergies No Known Allergies  SURGICAL HISTORY She  has a past surgical history that includes Vaginal hysterectomy; Knee surgery (07/2006); Breast cyst  excision; Colonoscopy (nov 2011); laparoscopy (N/A, 12/10/2016); and laparotomy (N/A, 12/10/2016). FAMILY HISTORY Her family history includes Atrial fibrillation in her mother; Cancer in her maternal grandmother, paternal grandfather, and paternal uncle; Heart disease in her father; Hypertension in her father; Thyroid disease in her mother. SOCIAL HISTORY She  reports that she has been smoking Cigarettes.  She has smoked for the past 30.00 years. She has never used smokeless tobacco. She reports that she does not drink alcohol or use drugs.   Review of Systems: Review of Systems  Constitutional: Negative for chills, fever and malaise/fatigue.  HENT: Negative for congestion, ear pain and sore throat.   Eyes: Negative.   Respiratory: Negative for cough, shortness of breath and wheezing.   Cardiovascular: Negative for chest pain, palpitations and leg swelling.  Gastrointestinal: Negative for abdominal pain, blood in stool, constipation, diarrhea, heartburn and melena.  Genitourinary: Negative.   Musculoskeletal: Negative.   Skin: Negative.   Neurological: Negative for dizziness, sensory change, loss of consciousness and headaches.  Psychiatric/Behavioral: Negative for depression. The patient is not nervous/anxious and does not have insomnia.     Physical Exam: Estimated body mass index is 21.37 kg/m as calculated from the following:   Height as of this encounter: 4\' 11"  (1.499 m).   Weight as of this encounter: 105 lb 12.8 oz (48 kg). BP 126/80   Pulse 71   Temp (!) 97.5 F (36.4 C)   Resp 14   Ht 4\' 11"  (1.499 m)   Wt 105 lb 12.8 oz (48 kg)   SpO2 98%   BMI 21.37 kg/m   General Appearance: Well nourished well developed, in no apparent distress.  Eyes: PERRLA, EOMs, conjunctiva no swelling or erythema ENT/Mouth: Ear canals normal without obstruction, swelling, erythema, or discharge.  TMs normal bilaterally with no erythema, bulging, retraction, or loss of landmark.  Oropharynx  moist and clear with no exudate, erythema, or swelling.   Neck: Supple, thyroid normal. No bruits.  No cervical adenopathy Respiratory: Respiratory effort normal, Breath sounds clear A&P without wheeze, rhonchi, rales.   Cardio: RRR without murmurs, rubs or gallops. Brisk peripheral pulses without edema.  Chest: symmetric, with normal excursions Breasts: Symmetric, without lumps, nipple discharge, retractions.  Abdomen: Soft, nontender, no guarding, rebound, hernias, masses, or organomegaly.  Lymphatics: Non tender without lymphadenopathy.  Genitourinary: Deferred to Obgyn. Musculoskeletal: Full ROM all peripheral extremities,5/5 strength, and normal gait.  Skin: Warm, dry without rashes, lesions, ecchymosis. Neuro: Awake and oriented X 3, Cranial nerves intact, reflexes equal bilaterally. Normal muscle tone, no cerebellar symptoms. Sensation intact.  Psych:  normal affect, Insight and Judgment appropriate.   EKG: WNL no changes.  AORTA SCAN: WNL   Over 40 minutes of exam, counseling, chart review and critical decision making was performed  Vicie Mutters 12:27 PM [facility]

## 2017-03-20 LAB — BASIC METABOLIC PANEL WITH GFR
BUN: 11 mg/dL (ref 7–25)
CALCIUM: 9.2 mg/dL (ref 8.6–10.4)
CHLORIDE: 106 mmol/L (ref 98–110)
CO2: 28 mmol/L (ref 20–32)
CREATININE: 0.59 mg/dL (ref 0.50–0.99)
GFR, EST NON AFRICAN AMERICAN: 97 mL/min/{1.73_m2} (ref >=60)
GFR, Est African American: 112 mL/min/{1.73_m2} (ref >=60)
Glucose, Bld: 80 mg/dL (ref 65–99)
POTASSIUM: 4.5 mmol/L (ref 3.5–5.3)
SODIUM: 140 mmol/L (ref 135–146)

## 2017-03-20 LAB — URINALYSIS, ROUTINE W REFLEX MICROSCOPIC
BILIRUBIN URINE: NEGATIVE
GLUCOSE, UA: NEGATIVE
HGB URINE DIPSTICK: NEGATIVE
KETONES UR: NEGATIVE
Leukocytes, UA: NEGATIVE
Nitrite: NEGATIVE
PH: 5.5 (ref 5.0–8.0)
PROTEIN: NEGATIVE
SPECIFIC GRAVITY, URINE: 1.009 (ref 1.001–1.03)

## 2017-03-20 LAB — HEPATIC FUNCTION PANEL
AG Ratio: 2.3 (calc) (ref 1.0–2.5)
ALBUMIN MSPROF: 4.4 g/dL (ref 3.6–5.1)
ALKALINE PHOSPHATASE (APISO): 69 U/L (ref 33–130)
ALT: 9 U/L (ref 6–29)
AST: 14 U/L (ref 10–35)
BILIRUBIN DIRECT: 0.1 mg/dL (ref 0.0–0.2)
BILIRUBIN INDIRECT: 0.3 mg/dL (ref 0.2–1.2)
Globulin: 1.9 g/dL (calc) (ref 1.9–3.7)
Total Bilirubin: 0.4 mg/dL (ref 0.2–1.2)
Total Protein: 6.3 g/dL (ref 6.1–8.1)

## 2017-03-20 LAB — CBC WITH DIFFERENTIAL/PLATELET
BASOS ABS: 90 {cells}/uL (ref 0–200)
Basophils Relative: 1.8 %
EOS PCT: 1 %
Eosinophils Absolute: 50 cells/uL (ref 15–500)
HEMATOCRIT: 39.9 % (ref 35.0–45.0)
HEMOGLOBIN: 12.9 g/dL (ref 11.7–15.5)
LYMPHS ABS: 1635 {cells}/uL (ref 850–3900)
MCH: 29 pg (ref 27.0–33.0)
MCHC: 32.3 g/dL (ref 32.0–36.0)
MCV: 89.7 fL (ref 80.0–100.0)
MPV: 10.5 fL (ref 7.5–12.5)
Monocytes Relative: 9.8 %
NEUTROS ABS: 2735 {cells}/uL (ref 1500–7800)
Neutrophils Relative %: 54.7 %
Platelets: 262 10*3/uL (ref 140–400)
RBC: 4.45 10*6/uL (ref 3.80–5.10)
RDW: 11.9 % (ref 11.0–15.0)
Total Lymphocyte: 32.7 %
WBC: 5 10*3/uL (ref 3.8–10.8)
WBCMIX: 490 {cells}/uL (ref 200–950)

## 2017-03-20 LAB — VITAMIN D 25 HYDROXY (VIT D DEFICIENCY, FRACTURES): Vit D, 25-Hydroxy: 31 ng/mL (ref 30–100)

## 2017-03-20 LAB — IRON, TOTAL/TOTAL IRON BINDING CAP
%SAT: 29 % (ref 11–50)
IRON: 92 ug/dL (ref 45–160)
TIBC: 321 ug/dL (ref 250–450)

## 2017-03-20 LAB — VITAMIN B12: VITAMIN B 12: 273 pg/mL (ref 200–1100)

## 2017-03-20 LAB — LIPID PANEL
Cholesterol: 255 mg/dL — ABNORMAL HIGH (ref <200)
HDL: 99 mg/dL (ref >50)
LDL Cholesterol (Calc): 137 mg/dL (calc) — ABNORMAL HIGH
NON-HDL CHOLESTEROL (CALC): 156 mg/dL — AB (ref <130)
TRIGLYCERIDES: 87 mg/dL (ref <150)
Total CHOL/HDL Ratio: 2.6 (calc) (ref <5.0)

## 2017-03-20 LAB — TSH: TSH: 2.55 m[IU]/L (ref 0.40–4.50)

## 2017-03-20 LAB — MICROALBUMIN / CREATININE URINE RATIO
CREATININE, URINE: 40 mg/dL (ref 20–275)
Microalb Creat Ratio: 10 mcg/mg creat (ref <30)
Microalb, Ur: 0.4 mg/dL

## 2017-03-20 LAB — MAGNESIUM: MAGNESIUM: 2.2 mg/dL (ref 1.5–2.5)

## 2017-07-15 ENCOUNTER — Telehealth: Payer: Self-pay

## 2017-07-15 MED ORDER — AZITHROMYCIN 250 MG PO TABS: ORAL_TABLET | ORAL | 1 refills | Status: AC

## 2017-07-15 MED ORDER — BENZONATATE 100 MG PO CAPS: 100.0000 mg | ORAL_CAPSULE | Freq: Three times a day (TID) | ORAL | 0 refills | Status: DC | PRN

## 2017-07-15 MED ORDER — PREDNISONE 20 MG PO TABS
ORAL_TABLET | ORAL | 0 refills | Status: DC
Start: 2017-07-15 — End: 2018-03-31

## 2017-07-15 NOTE — Telephone Encounter (Signed)
See nurse note.  Patient smoker with 2 weeks of sinus/cough symtoms, on meds. Will send in prednisone, zpak, tessalon to [facility].   No Known Allergies  Medications Current Outpatient Medications on File Prior to Visit  Medication Sig  . docusate sodium (COLACE) 100 MG capsule Take 200 mg by mouth at bedtime.  . lidocaine (XYLOCAINE) 2 % solution Use as directed 20 mLs in the mouth or throat as needed for mouth pain.   No current facility-administered medications on file prior to visit.     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

## 2017-07-15 NOTE — Telephone Encounter (Signed)
Message left on voice mail.

## 2017-07-15 NOTE — Addendum Note (Signed)
Addended by: Vicie Mutters R on: 07/15/2017 12:34 PM   Modules accepted: Orders

## 2017-07-15 NOTE — Telephone Encounter (Signed)
Patient has been sick for 2 weeks. Has sinus drainage, sinus pressure, achy, sore throat, sneezing, headache and cough. Has taken allergy meds and nasonex.

## 2018-02-11 ENCOUNTER — Ambulatory Visit: Payer: PPO

## 2018-02-11 DIAGNOSIS — Z79899 Other long term (current) drug therapy: Secondary | ICD-10-CM | POA: Diagnosis not present

## 2018-02-11 DIAGNOSIS — I10 Essential (primary) hypertension: Secondary | ICD-10-CM | POA: Diagnosis not present

## 2018-02-11 DIAGNOSIS — K644 Residual hemorrhoidal skin tags: Secondary | ICD-10-CM | POA: Diagnosis not present

## 2018-02-11 DIAGNOSIS — Z136 Encounter for screening for cardiovascular disorders: Secondary | ICD-10-CM | POA: Diagnosis not present

## 2018-02-11 DIAGNOSIS — N200 Calculus of kidney: Secondary | ICD-10-CM | POA: Diagnosis not present

## 2018-02-11 DIAGNOSIS — K562 Volvulus: Secondary | ICD-10-CM | POA: Diagnosis not present

## 2018-02-11 DIAGNOSIS — Z0001 Encounter for general adult medical examination with abnormal findings: Secondary | ICD-10-CM | POA: Diagnosis not present

## 2018-02-11 DIAGNOSIS — Z7189 Other specified counseling: Secondary | ICD-10-CM | POA: Diagnosis not present

## 2018-02-11 DIAGNOSIS — R6889 Other general symptoms and signs: Secondary | ICD-10-CM | POA: Diagnosis not present

## 2018-02-11 DIAGNOSIS — B009 Herpesviral infection, unspecified: Secondary | ICD-10-CM | POA: Diagnosis not present

## 2018-02-11 DIAGNOSIS — I700 Atherosclerosis of aorta: Secondary | ICD-10-CM | POA: Diagnosis not present

## 2018-02-11 DIAGNOSIS — Z23 Encounter for immunization: Secondary | ICD-10-CM | POA: Diagnosis not present

## 2018-03-28 NOTE — Progress Notes (Signed)
WELCOME TO MEDICARE ANNUAL WELLNESS VISIT  Assessment:    Encounter for Medicare annual wellness exam 1 year  Hyperlipidemia, unspecified hyperlipidemia type check lipids decrease fatty foods increase activity.  Declines statin at this time, LONG discussion -     CBC with Differential/Platelet -     COMPLETE METABOLIC PANEL WITH GFR -     TSH -     Lipid panel -     DG Chest 2 View; Future -     [place], RETROPERITNL ABD,  LTD -     EKG 12-Lead  TOBACCO ABUSE Smoking cessation-  instruction/counseling given, counseled patient on the dangers of tobacco use, advised patient to stop smoking, and reviewed strategies to maximize success, patient not ready to quit at this time.  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   Need for prophylactic vaccination against Streptococcus pneumoniae (pneumococcus) -     Pneumococcal polysaccharide vaccine 23-valent greater than or equal to 2yo subcutaneous/IM  Atherosclerosis of aorta (HCC) -     [place], RETROPERITNL ABD,  LTD- NORMAL  HSV-1 infection -     acyclovir cream (ZOVIRAX) 5 %; Apply 1 application topically every 3 (three) hours.  Medication management -     Magnesium  Needs flu shot -     Flu vaccine HIGH DOSE PF  Allergic rhinitis -     mometasone (NASONEX) 50 MCG/ACT nasal spray; Place 2 sprays into the nose daily.    Over 40 minutes of exam, counseling, chart review and critical decision making was performed Future Appointments  Date Time Provider [place]   04/06/2019 10:00 AM Vicie Mutters, PA-C [id number] None     Plan:   During the course of the visit the patient was educated and counseled about appropriate screening and preventive services including:    Pneumococcal vaccine   Prevnar 13  Influenza vaccine  Td vaccine  Screening electrocardiogram  Bone densitometry screening  Colorectal cancer screening  Diabetes screening  Glaucoma screening  Nutrition counseling   Advanced directives: requested   Subjective:  GENELLA BAS is a 67 y.o. female who presents for Medicare Annual Wellness Visit.  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 She takes colace, no constipation/diarrhea. She follows with Dr. Ammie Dalton.       Her blood pressure has been controlled at home, today their BP is BP: 128/74 She does workout, she does [employer] 3-4 days a week. She denies chest pain, shortness of breath, dizziness.  BMI is Body mass index is 23.01 kg/m., she is working on diet and exercise. Wt Readings from Last 3 Encounters:  03/31/18 112 lb (50.8 kg)  03/19/17 105 lb 12.8 oz (48 kg)  01/22/17 104 lb 6.4 oz (47.4 kg)    She is not on cholesterol medication and denies myalgias. Her cholesterol is not at goal.  The cholesterol last visit was:   Lab Results  Component Value Date   CHOL 255 (H) 03/19/2017   HDL 99 03/19/2017   LDLCALC 137 (H) 03/19/2017   LDLDIRECT 166.3 03/05/2011   TRIG 87 03/19/2017   CHOLHDL 2.6 03/19/2017    Last A1C in the office was:  Lab Results  Component Value Date   HGBA1C 5.3 11/09/2015   Last GFR: Lab Results  Component Value Date   GFRNONAA 97 03/19/2017   Patient is on Vitamin D supplement.   Lab Results  Component Value Date   VD25OH 31 03/19/2017      Medication  Review: Current Outpatient Medications on File Prior to Visit  Medication Sig Dispense Refill  . docusate sodium (COLACE) 100 MG capsule Take 200 mg by mouth at bedtime.    . lidocaine (XYLOCAINE) 2 % solution Use as directed 20 mLs in the mouth or throat as needed for mouth pain. 100 mL 3   No current facility-administered medications on file prior to visit.     No Known Allergies  Current Problems (verified) Patient Active Problem List   Diagnosis Date Noted  . Atherosclerosis of aorta ([place]) 03/31/2018  . Personal history of benign carcinoid tumor 03/19/2017  . 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 External hemorrhoids with complication 14/43/1540  . TOBACCO ABUSE 08/16/2007  . RENAL CALCULUS 08/16/2007  . Hyperlipidemia 08/09/2007    Screening Tests Immunization History  Administered Date(s) Administered  . Influenza, High Dose Seasonal PF 03/31/2018  . Pneumococcal Conjugate-13 04/30/2015  . Pneumococcal Polysaccharide-23 03/31/2018  . Td 08/02/1996  . 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 [facility] here for primary care Patient Care Team: Unk Pinto, MD as PCP - General (Internal Medicine)  SURGICAL HISTORY She  has a past surgical history that includes Vaginal hysterectomy; Knee surgery (07/2006); Breast cyst excision; Colonoscopy (nov 2011); laparoscopy (N/A, 12/10/2016); and laparotomy (N/A, 12/10/2016). FAMILY HISTORY Her family history includes Atrial fibrillation in her mother; Cancer in her maternal  grandmother, paternal grandfather, and paternal uncle; Heart disease in her father; Hypertension in her father; Thyroid disease in her mother.  MGM leukemia, PGF leukemia, P uncle ? Lymphoma DAD with heart disease age 27 Mom with pacemaker, dementia Sister with MS  SOCIAL HISTORY She  reports that she has been smoking cigarettes. She has smoked for the past 30.00 years. She has never used smokeless tobacco. She reports that she does not drink alcohol or use drugs. A pack every 2 weeks.    MEDICARE WELLNESS OBJECTIVES: Physical activity: Current Exercise Habits: Home exercise routine;Structured exercise class, Type of exercise: walking;strength training/weights(at [employer]), Time (Minutes): 30, Frequency (Times/Week): 4, Weekly Exercise (Minutes/Week): 120 Cardiac risk factors: Cardiac Risk Factors include: advanced age (>68men, >38 women);dyslipidemia;smoking/ tobacco exposure Depression/mood screen:   Depression screen [facility] 2/9 03/31/2018  Decreased Interest 0  Down, Depressed, Hopeless 0  PHQ - 2 Score 0    ADLs:  In your present state of health, do you have any difficulty performing the following activities: 03/31/2018  Hearing? N  Vision? N  Difficulty concentrating or making decisions? N  Walking or climbing stairs? N  Dressing or bathing? N  Doing errands, shopping? N  Some recent data might be hidden     Cognitive Testing  Alert? Yes  Normal Appearance?Yes  Oriented to person? Yes  Place? Yes   Time? Yes  Recall of three objects?  Yes  Can perform simple calculations? Yes  Displays appropriate judgment?Yes  Can read the correct time from a watch face?Yes  EOL planning: Does Patient Have a Medical Advance Directive?: No Would patient like information on creating a medical advance directive?: Yes (MAU/Ambulatory/Procedural Areas - Information given)  Review of Systems  Constitutional: Negative.   HENT: Negative.   Eyes: Negative.   Respiratory: Negative.    Cardiovascular: Negative.   Gastrointestinal: Negative.   Genitourinary: Negative.   Musculoskeletal: Negative.   Skin: Negative.   Neurological: Negative.   Endo/Heme/Allergies: Negative.   Psychiatric/Behavioral: Negative.      Objective:     Today's Vitals   03/31/18 0953  BP: 128/74  Pulse: 63  Resp: 14  Temp: 98.3 F (36.8 C)  SpO2: 97%  Weight: 112 lb (50.8 kg)  Height: 4' 10.5" (1.486 m)  PainSc: 0-No pain   Body mass index is 23.01 kg/m.  General appearance: alert, no distress, WD/WN, female HEENT: normocephalic, sclerae anicteric, TMs pearly, nares patent, no discharge or erythema, pharynx normal Oral cavity: MMM, no lesions Neck: supple, no lymphadenopathy, no thyromegaly, no masses Heart: RRR, normal S1, S2, no murmurs Lungs: CTA bilaterally, no wheezes, rhonchi, or rales Abdomen: +bs, soft, non tender, non distended, no masses, no hepatomegaly, no splenomegaly Musculoskeletal: nontender, no swelling, no obvious deformity Extremities: no edema, no cyanosis, no clubbing Pulses: 2+ symmetric, upper and lower extremities, normal cap refill Neurological: alert, oriented x 3, CN2-12 intact, strength normal upper extremities and lower extremities, sensation normal throughout, DTRs 2+ throughout, no cerebellar signs, gait normal Psychiatric: normal affect, behavior normal, pleasant   Medicare Attestation I have personally reviewed: The patient's medical and social history Their use of alcohol, tobacco or illicit drugs Their current medications and supplements The patient's functional ability including ADLs,fall risks, home safety risks, cognitive, and hearing and visual impairment Diet and physical activities Evidence for depression or mood disorders  The patient's weight, height, BMI, and visual acuity have been recorded in the chart.  I have made referrals, counseling, and provided education to the patient based on review of the above and I have provided the  patient with a written personalized care plan for preventive services.     Vicie Mutters, PA-C   03/31/2018

## 2018-03-31 ENCOUNTER — Ambulatory Visit (INDEPENDENT_AMBULATORY_CARE_PROVIDER_SITE_OTHER): Payer: PPO | Admitting: Physician Assistant

## 2018-03-31 ENCOUNTER — Telehealth: Payer: Self-pay

## 2018-03-31 ENCOUNTER — Encounter: Payer: Self-pay | Admitting: Physician Assistant

## 2018-03-31 ENCOUNTER — Other Ambulatory Visit: Payer: Self-pay

## 2018-03-31 VITALS — BP 128/74 | HR 63 | Temp 98.3°F | Resp 14 | Ht 58.5 in | Wt 112.0 lb

## 2018-03-31 DIAGNOSIS — Z79899 Other long term (current) drug therapy: Secondary | ICD-10-CM | POA: Diagnosis not present

## 2018-03-31 DIAGNOSIS — I700 Atherosclerosis of aorta: Secondary | ICD-10-CM | POA: Diagnosis not present

## 2018-03-31 DIAGNOSIS — I10 Essential (primary) hypertension: Secondary | ICD-10-CM

## 2018-03-31 DIAGNOSIS — Z0001 Encounter for general adult medical examination with abnormal findings: Secondary | ICD-10-CM

## 2018-03-31 DIAGNOSIS — Z86012 Personal history of benign carcinoid tumor: Secondary | ICD-10-CM

## 2018-03-31 DIAGNOSIS — B009 Herpesviral infection, unspecified: Secondary | ICD-10-CM

## 2018-03-31 DIAGNOSIS — F17200 Nicotine dependence, unspecified, uncomplicated: Secondary | ICD-10-CM

## 2018-03-31 DIAGNOSIS — N200 Calculus of kidney: Secondary | ICD-10-CM | POA: Diagnosis not present

## 2018-03-31 DIAGNOSIS — Z23 Encounter for immunization: Secondary | ICD-10-CM

## 2018-03-31 DIAGNOSIS — Z7189 Other specified counseling: Secondary | ICD-10-CM

## 2018-03-31 DIAGNOSIS — Z136 Encounter for screening for cardiovascular disorders: Secondary | ICD-10-CM

## 2018-03-31 DIAGNOSIS — R6889 Other general symptoms and signs: Secondary | ICD-10-CM | POA: Diagnosis not present

## 2018-03-31 DIAGNOSIS — E785 Hyperlipidemia, unspecified: Secondary | ICD-10-CM | POA: Diagnosis not present

## 2018-03-31 DIAGNOSIS — K562 Volvulus: Secondary | ICD-10-CM

## 2018-03-31 DIAGNOSIS — Z0000 Encounter for general adult medical examination without abnormal findings: Secondary | ICD-10-CM

## 2018-03-31 DIAGNOSIS — K644 Residual hemorrhoidal skin tags: Secondary | ICD-10-CM

## 2018-03-31 LAB — COMPLETE METABOLIC PANEL WITH GFR
AG Ratio: 2 (calc) (ref 1.0–2.5)
ALKALINE PHOSPHATASE (APISO): 67 U/L (ref 33–130)
ALT: 10 U/L (ref 6–29)
AST: 15 U/L (ref 10–35)
Albumin: 4.3 g/dL (ref 3.6–5.1)
BUN: 10 mg/dL (ref 7–25)
CO2: 31 mmol/L (ref 20–32)
CREATININE: 0.67 mg/dL (ref 0.50–0.99)
Calcium: 9.5 mg/dL (ref 8.6–10.4)
Chloride: 107 mmol/L (ref 98–110)
GFR, EST AFRICAN AMERICAN: 107 mL/min/{1.73_m2} (ref >=60)
GFR, EST NON AFRICAN AMERICAN: 92 mL/min/{1.73_m2} (ref >=60)
GLOBULIN: 2.2 g/dL (ref 1.9–3.7)
Glucose, Bld: 82 mg/dL (ref 65–99)
POTASSIUM: 4.9 mmol/L (ref 3.5–5.3)
SODIUM: 144 mmol/L (ref 135–146)
Total Bilirubin: 0.4 mg/dL (ref 0.2–1.2)
Total Protein: 6.5 g/dL (ref 6.1–8.1)

## 2018-03-31 LAB — CBC WITH DIFFERENTIAL/PLATELET
Basophils Absolute: 91 cells/uL (ref 0–200)
Basophils Relative: 1.6 %
EOS PCT: 2.1 %
Eosinophils Absolute: 120 cells/uL (ref 15–500)
HEMATOCRIT: 39.7 % (ref 35.0–45.0)
Hemoglobin: 13.5 g/dL (ref 11.7–15.5)
LYMPHS ABS: 1927 {cells}/uL (ref 850–3900)
MCH: 30.8 pg (ref 27.0–33.0)
MCHC: 34 g/dL (ref 32.0–36.0)
MCV: 90.6 fL (ref 80.0–100.0)
MPV: 10.8 fL (ref 7.5–12.5)
Monocytes Relative: 10 %
NEUTROS PCT: 52.5 %
Neutro Abs: 2993 cells/uL (ref 1500–7800)
PLATELETS: 246 10*3/uL (ref 140–400)
RBC: 4.38 10*6/uL (ref 3.80–5.10)
RDW: 11.8 % (ref 11.0–15.0)
TOTAL LYMPHOCYTE: 33.8 %
WBC mixed population: 570 cells/uL (ref 200–950)
WBC: 5.7 10*3/uL (ref 3.8–10.8)

## 2018-03-31 LAB — TSH: TSH: 3.88 mIU/L (ref 0.40–4.50)

## 2018-03-31 LAB — LIPID PANEL
CHOL/HDL RATIO: 3.5 (calc) (ref <5.0)
CHOLESTEROL: 263 mg/dL — AB (ref <200)
HDL: 75 mg/dL (ref >50)
LDL CHOLESTEROL (CALC): 166 mg/dL — AB
NON-HDL CHOLESTEROL (CALC): 188 mg/dL — AB (ref <130)
Triglycerides: 104 mg/dL (ref <150)

## 2018-03-31 LAB — MAGNESIUM: MAGNESIUM: 2 mg/dL (ref 1.5–2.5)

## 2018-03-31 MED ORDER — MOMETASONE FUROATE 50 MCG/ACT NA SUSP: 2.0000 | Freq: Every day | NASAL | 2 refills | Status: DC

## 2018-03-31 MED ORDER — ACYCLOVIR 5 % EX CREA: 1.0000 "application " | TOPICAL_CREAM | CUTANEOUS | 0 refills | Status: DC

## 2018-03-31 NOTE — Telephone Encounter (Signed)
PHARMACY CALL  Use ZOVIRAX topically 3 times a day as needed

## 2018-03-31 NOTE — Patient Instructions (Signed)
INFORMATION ABOUT YOUR XRAY  [facility] [place] go to radiology and give them your name. They will have the order and take you back. You do not any paper work, I should get the result back today or tomorrow. This order is good for a year.   Your LDL could improve, ideally we want it under a 100.  Your LDL is the bad cholesterol that can lead to heart attack and stroke. To lower your number you can decrease your fatty foods, red meat, cheese, milk and increase fiber like whole grains and veggies. You can also add a fiber supplement like Citracel or Benefiber, these do not cause gas and bloating and are safe to use. Especially if you have a strong family history of heart disease or stroke or you have evidence of plaque on any imaging like a chest xray, we may discuss at your next office visit putting you on a medication to get your number below 100.   GO TO PHARMACY FOR SHINGLES VACCINE  Can do a steroid nasal spary 1-2 sparys at night each nostril. Remember to spray each nostril twice towards the outer part of your eye.  Do not sniff but instead pinch your nose and tilt your head back to help the medicine get into your sinuses.  The best time to do this is at bedtime. Stop if you get blurred vision or nose bleeds.     GET YOUR MAMMOGRAM HOW TO SCHEDULE A MAMMOGRAM  [facility]  7 a.m.-6:30 p.m., Monday 7 a.m.-5 p.m., Tuesday-Friday Schedule an appointment by calling [phone].   Statin therapy  In addition to the known lipid-lowering effects, statins are now widely accepted to have anti-inflammatory and immunomodulatory effects. Adjunctive use of statins has proven beneficial in the context of a wide range of inflammatory diseases, including rheumatoid arthritis.  Research has shown that the salutary effect of statins on cholesterol may not be their only benefit. Statin therapy has shown promise for everything from fighting viral infections to  protecting the eye from cataracts.  A 2005 study of more than 700 hospital patients being treated for pneumonia found that the death rate was more than twice as high among those who were not using statins. In 2006, a [place] study examined the rate of sepsis, a deadly blood infection, among patients who had been hospitalized for heart events. In the two years after their hospitalization, the statin users had a rate of sepsis 19% lower than that of the non-statin users. A 2009 review of 22 studies found that statins appeared to have a beneficial effect on the outcome of infection, but they couldn't come to a firm conclusion.   Atherosclerosis Atherosclerosis is narrowing and hardening of the blood vessels (arteries). Arteries are tubes that carry blood that contains oxygen from the heart to all parts of the body. Arteries can become narrow or clogged with a buildup of fat, cholesterol, calcium, or other substances (plaque). Plaque decreases the amount of blood that can flow through the artery. Atherosclerosis can affect any artery in the body, including:  Heart arteries (coronary artery disease), which may cause heart attack.  Brain arteries, which may cause stroke.  Leg, arm, and pelvis arteries (peripheral artery disease), which may cause pain and numbness.  Kidney arteries, which may cause kidney (renal) failure.  Treatment may slow the disease and prevent further damage to the heart, brain, peripheral arteries, and kidneys. What are the causes? Atherosclerosis develops when plaque  forms in an artery. This damages the inside wall of the artery. Over time, the plaque grows and hardens. It may break through the artery wall. This causes a blood clot to form over the break, which narrows the artery more. The clot may also break loose and travel to other arteries, causing more damage. What increases the risk? This condition is more likely to develop in people who:  Are middle age or  older.  Have a family history of atherosclerosis.  Have high cholesterol.  Have high blood fats (triglycerides).  Have diabetes.  Are overweight.  Smoke tobacco.  Do not exercise enough.  Have a substance in the blood that indicates increased levels of inflammation in the body (C-reactive protein, or CRP).  Have sleep apnea.  Are stressed.  Drink too much alcohol.  What are the signs or symptoms? This condition may not cause any symptoms. If you do have symptoms, they are caused by damage to an area of your body that is not getting enough blood. The following symptoms are possible:  Coronary artery disease may cause chest pain and shortness of breath.  Decreased blood supply to your brain may cause a stroke. Signs and symptoms of stroke may include sudden: ? Weakness on one side of the body. ? Confusion. ? Changes in vision. ? Inability to speak or understand speech. ? Loss of balance, coordination, or ability to walk. ? Severe headache. ? Loss of consciousness.  Peripheral artery disease may cause pain and numbness, often in the legs and hips.  Renal failure may cause fatigue, nausea, swelling, and itchy skin.  How is this diagnosed? This condition is diagnosed based on your medical history and a physical exam. During the exam, your health care provider will check your pulses and listen for a "whooshing" sound over your arteries (bruit). You may have tests, such as:  Blood tests to check your levels of cholesterol, triglycerides, and CRP.  Electrocardiogram (ECG) to check for heart damage.  Chest X-ray to see if your heart is enlarged, which is a sign of heart failure.  Stress test to see how your heart reacts to exercise.  Echocardiogram to get images of the inside of your heart.  Ankle-Brachial index to compare blood pressure in your arms to blood pressure in your ankles.  Ultrasound of your peripheral arteries to check blood flow.  CT scan to check for  damage to your heart or brain.  X-rays of blood vessels after dye has been injected (angiogram) to check blood flow.  How is this treated? Treatment starts with lifestyle changes, which may include:  Changing your diet.  Losing weight.  Reducing stress.  Exercising and being more physically active.  Not smoking.  You also may need medicine to:  Lower triglycerides and cholesterol.  Lower and control blood pressure.  Prevent blood clots.  Lower inflammation in your body.  Lower and control your blood sugar.  Sometimes, surgery is needed to remove plaque, widen your artery, or create a new path for your blood (bypass). Surgical treatment may include:  Removing plaque from an artery (endarterectomy).  Opening a narrowed heart artery (angioplasty).  Heart or peripheral artery bypass graft surgery.  Follow these instructions at home: Lifestyle   Eat a heart-healthy diet. Talk to your health care provider or a diet specialist (dietitian) if you need help. A heart-healthy diet includes: ? Limiting unhealthy fats and increasing healthy fats. Some examples of healthy fats are olive oil and canola oil. ? Eating plant-based  foods, such as fruits, vegetables, nuts, legumes, and whole grains.  Follow an exercise program as told by your health care provider.  Maintain a healthy weight. Lose weight if directed by your health care provider.  Rest when you are tired.  Learn to manage your stress.  Do not use any tobacco products, such as cigarettes, chewing tobacco, and e-cigarettes. If you need help quitting, ask your health care provider.  Limit alcohol intake to no more than 1 drink a day for nonpregnant women and 2 drinks a day for men. One drink equals 12 oz of beer, 5 oz of wine, or 1 oz of hard liquor.  Do not abuse drugs. General instructions  Take over-the-counter and prescription medicines only as told by your health care provider.  Manage other health  conditions as told by your health care provider.  Keep all follow-up visits as told by your health care provider. This is important. Contact a health care provider if:  You have chest pain or discomfort. This includes squeezing chest pain that may feel like indigestion (angina).  You have shortness of breath.  You have an irregular heartbeat.  You have unexplained fatigue.  You have unexplained pain or numbness in an arm, leg, or hip.  You have nausea, swelling of your hands or feet, and itchy skin. Get help right away if:  You have symptoms of a heart attack, such as: ? Chest pain. ? Shortness of breath. ? Pain in your neck, jaw, arms, back, or stomach. ? Cold sweat. ? Nausea. ? Light-headedness.  You have symptoms of a stroke, such as sudden: ? Weakness on one side of your body. ? Confusion. ? Changes in vision. ? Inability to speak or understand speech. ? Loss of balance, coordination, or ability to walk. ? Severe headache. ? Loss of consciousness. These symptoms may represent a serious problem that is an emergency. Do not wait to see if the symptoms will go away. Get medical help right away. Call your local emergency services (911 in the [place] Do not drive yourself to the hospital. This information is not intended to replace advice given to you by your health care provider. Make sure you discuss any questions you have with your health care provider. Document Released: 07/26/2003 Document Revised: 10/11/2015 Document Reviewed: 03/26/2015 Elsevier Interactive Patient Education  [employer].

## 2018-03-31 NOTE — Telephone Encounter (Signed)
New direction for ZOVIRAX

## 2018-06-28 DIAGNOSIS — J069 Acute upper respiratory infection, unspecified: Secondary | ICD-10-CM | POA: Diagnosis not present

## 2018-09-02 NOTE — Progress Notes (Addendum)
Virtual Visit via Televideo Note  I connected with [name] on 09/03/18 at  8:45 AM EDT by virtual visit by televideo modality and verified that I am speaking with the correct person using two identifiers.   I discussed the limitations, risks, security and privacy concerns of performing an evaluation and management service  and the availability of in person appointments. I also discussed with the patient that there may be a patient responsible charge related to this service. The patient expressed understanding and agreed to proceed.    I discussed the assessment and treatment plan with the patient. The patient was provided an opportunity to ask questions and all were answered. The patient agreed with the plan and demonstrated an understanding of the instructions.   The patient was advised to call back or seek an in-person evaluation if the symptoms worsen or if the condition fails to improve as anticipated.  I provided 30 minutes of non-face-to-face time during this encounter.   Izora Ribas, NP     MEDICARE ANNUAL WELLNESS VISIT  Assessment:    Encounter for Medicare annual wellness exam 1 year  Hyperlipidemia, unspecified hyperlipidemia type Absolutely declines cholesterol medications but very receptive to lifestyle modification recommendations. Discussed current diet in detail, recommend she cut down on crackers/cookies, processed foods, continue to limit animal fats, push high fiber items, discussed soluble fiber supplement.  Check lipids at next visit  TOBACCO ABUSE Smoking cessation-  instruction/counseling given, counseled patient on the dangers of tobacco use, advised patient to stop smoking, and reviewed strategies to maximize success, patient is gradually cutting down.  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  HSV-1 infection -     acyclovir cream (ZOVIRAX) 5 %; Apply 1 application topically every 3 (three) hours.  Insomnia secondary to situational depression She declines SSRI or wellbutrin; she wants assistance with sleep but wants non-addictive option.  good sleep hygiene discussed, increase day time activity, try melatonin or benadryl if this does not help try adding trazodone -     traZODone (DESYREL) 50 MG tablet; 1/2-1 tablet for sleep    Over 40 minutes of exam, counseling, chart review and critical decision making was performed Future Appointments  Date Time Provider [place]  04/06/2019 10:00 AM Vicie Mutters, PA-C [facility] None     Plan:   During the course of the visit the patient was educated and counseled about appropriate screening and preventive services including:    Pneumococcal vaccine   Prevnar 13  Influenza vaccine  Td vaccine  Screening electrocardiogram  Bone densitometry screening  Colorectal cancer screening  Diabetes screening  Glaucoma screening  Nutrition counseling   Advanced directives: requested   Subjective:  [name] is a 68 y.o. female who presents for Medicare Annual Wellness Visit.  Today she reports her mother passed suddenly 2 weeks ago after getting pneumonia, she fell in the hospital and had brain swelling and was transitioned to hospice before passing away quickly.  She denies SI/HI. Her family is calling frequently to check on her  but she is sturggling with not being able to see them in person due to covid 19. She is having difficulty sleeping and requests assistance  with this. She does not want a daily depression medication.   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 She takes colace, no constipation/diarrhea. She follows with Dr. Ammie Dalton.   she currently continues to smoke 1 pack a week; discussed risks associated with smoking, patient is ready to quit and is cutting down gradually.     She does not check BP at home; today their BP is   n/a She does workout, she does [employer] 3-4 days a week. She denies chest pain, shortness of breath, dizziness.   BMI is There is no height or weight on file to calculate BMI., she is working on diet and exercise. Wt Readings from Last 3 Encounters:  03/31/18 112 lb (50.8 kg)  03/19/17 105 lb 12.8 oz (48 kg)  01/22/17 104 lb 6.4 oz (47.4 kg)   She is not on cholesterol medication, has declined statin despite counseling and discussion of risks and benefits. She is eating salads, vegetables, baked proteins, using olive oil.  Her cholesterol is not at goal. The cholesterol last visit was:   Lab Results  Component Value Date   CHOL 263 (H) 03/31/2018   HDL 75 03/31/2018   LDLCALC 166 (H) 03/31/2018   LDLDIRECT 166.3 03/05/2011   TRIG 104 03/31/2018   CHOLHDL 3.5 03/31/2018    Last A1C in the office was:   Lab Results  Component Value Date   HGBA1C 5.3 11/09/2015   Last GFR: Lab Results  Component Value Date   GFRNONAA 92 03/31/2018   Patient is not on Vitamin D supplement but willing to start;    Lab Results  Component Value Date   VD25OH 31 03/19/2017       Medication Review: Current Outpatient Medications on File Prior to Visit  Medication Sig Dispense Refill  . docusate sodium (COLACE) 100 MG capsule Take 200 mg by mouth at bedtime.    . lidocaine (XYLOCAINE) 2 % solution Use as directed 20  mLs in the mouth or throat as needed for mouth pain. 100 mL 3  . mometasone (NASONEX) 50 MCG/ACT nasal spray Place 2 sprays into the nose daily. 17 g 2   No current facility-administered medications on file prior to visit.     No Known Allergies  Current Problems (verified) Patient Active Problem List   Diagnosis Date Noted  . Atherosclerosis of aorta ([place]) 03/31/2018  . Personal history of benign carcinoid tumor 03/19/2017  . 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 External hemorrhoids with complication 40/02/2724  . TOBACCO ABUSE 08/16/2007  . RENAL CALCULUS 08/16/2007  . Hyperlipidemia 08/09/2007    Screening Tests Immunization History  Administered Date(s) Administered  . Influenza, High Dose Seasonal PF 02/11/2018  . Pneumococcal Conjugate-13 04/30/2015  . Pneumococcal Polysaccharide-23 03/31/2018  . Td 08/02/1996  . 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  [facility] for primary care 2. Last Eye exam:  [facility], 2018, due  3. Last dental exam: upper dentures, partial on bottom  Patient Care Team: Unk Pinto, MD as PCP - General (Internal Medicine)  SURGICAL HISTORY She  has a past surgical history that includes Vaginal hysterectomy; Knee surgery (07/2006); Breast cyst excision; Colonoscopy (nov 2011); laparoscopy (N/A, 12/10/2016); and laparotomy (N/A, 12/10/2016). FAMILY HISTORY Her family history includes Atrial fibrillation in her mother; Cancer in her maternal grandmother, paternal grandfather, and paternal uncle;  Heart disease in her father; Hypertension in her father; Thyroid disease in her mother.  MGM leukemia, PGF leukemia, P uncle ? Lymphoma DAD with heart disease age 9 Mom with pacemaker, dementia Sister with MS  SOCIAL HISTORY She  reports that she has been smoking cigarettes. She has smoked for the past 30.00 years. She has never used smokeless tobacco. She reports that she does not drink alcohol or use drugs. A pack every 2 weeks.    MEDICARE WELLNESS OBJECTIVES: Physical activity: Current Exercise Habits: Home exercise routine, Type of exercise: strength training/weights;walking;yoga, Time (Minutes): 45, Frequency (Times/Week): 4, Weekly Exercise (Minutes/Week): 180, Intensity: Mild, Exercise limited by: None identified Cardiac risk factors: Cardiac Risk Factors include: advanced age (>18men, >89 women);smoking/ tobacco exposure;dyslipidemia;family history of premature cardiovascular disease Depression/mood screen:   Depression screen [facility] 2/9 09/03/2018  Decreased Interest 3  Down, Depressed, Hopeless 2  PHQ - 2 Score 5  Altered sleeping 3  Tired, decreased energy 3  Change in appetite 0  Feeling bad or failure about yourself  1  Trouble concentrating 1  Moving slowly or fidgety/restless 0  Suicidal thoughts 0  PHQ-9 Score 13  Difficult doing work/chores Very difficult    ADLs:  In your present state of health, do you have any difficulty performing the following activities: 09/03/2018 03/31/2018  Hearing? N N  Vision? N N  Difficulty concentrating or making decisions? N N  Walking or climbing stairs? N N  Dressing or bathing? N N  Doing errands, shopping? N N  Some recent data might be hidden     Cognitive Testing  Alert? Yes  Normal Appearance?Yes  Oriented to person? Yes  Place? Yes   Time? Yes  Recall of three objects?  Yes  Can perform simple calculations? Yes  Displays appropriate judgment?Yes  Can read the correct time from a watch face?Yes  EOL planning: Does  Patient Have a Medical Advance Directive?: No Would patient like information on creating a medical advance directive?: No - Patient declined  Review of Systems  Constitutional: Negative.   HENT: Negative.   Eyes: Negative.   Respiratory: Negative.   Cardiovascular: Negative.   Gastrointestinal: Negative.   Genitourinary: Negative.   Musculoskeletal: Negative.   Skin: Negative.   Neurological: Negative.   Endo/Heme/Allergies: Negative.   Psychiatric/Behavioral: Positive for depression. Negative for substance abuse and suicidal ideas. The patient has insomnia. The patient is not nervous/anxious.      Objective:     There were no vitals filed for this visit. There is no height or weight on file to calculate BMI.  General : Well sounding patient in no apparent distress HEENT: no hoarseness, no cough for duration of visit Lungs: speaks in complete sentences, no audible wheezing, no apparent distress Neurological: alert, oriented x 3 Psychiatric: pleasant, intermittently tearful, judgement appropriate    Medicare Attestation I have personally reviewed: The patient's medical  and social history Their use of alcohol, tobacco or illicit drugs Their current medications and supplements The patient's functional ability including ADLs,fall risks, home safety risks, cognitive, and hearing and visual impairment Diet and physical activities Evidence for depression or mood disorders  The patient's weight, height, BMI, and visual acuity have been recorded in the chart.  I have made referrals, counseling, and provided education to the patient based on review of the above and I have provided the patient with a written personalized care plan for preventive services.     Izora Ribas, NP   09/03/2018

## 2018-09-03 ENCOUNTER — Ambulatory Visit: Payer: PPO | Admitting: Adult Health

## 2018-09-03 ENCOUNTER — Encounter: Payer: Self-pay | Admitting: Adult Health

## 2018-09-03 ENCOUNTER — Other Ambulatory Visit: Payer: Self-pay

## 2018-09-03 DIAGNOSIS — E785 Hyperlipidemia, unspecified: Secondary | ICD-10-CM

## 2018-09-03 DIAGNOSIS — K562 Volvulus: Secondary | ICD-10-CM

## 2018-09-03 DIAGNOSIS — E559 Vitamin D deficiency, unspecified: Secondary | ICD-10-CM

## 2018-09-03 DIAGNOSIS — N200 Calculus of kidney: Secondary | ICD-10-CM

## 2018-09-03 DIAGNOSIS — K644 Residual hemorrhoidal skin tags: Secondary | ICD-10-CM

## 2018-09-03 DIAGNOSIS — F5105 Insomnia due to other mental disorder: Secondary | ICD-10-CM | POA: Diagnosis not present

## 2018-09-03 DIAGNOSIS — Z86012 Personal history of benign carcinoid tumor: Secondary | ICD-10-CM

## 2018-09-03 DIAGNOSIS — R6889 Other general symptoms and signs: Secondary | ICD-10-CM | POA: Diagnosis not present

## 2018-09-03 DIAGNOSIS — I700 Atherosclerosis of aorta: Secondary | ICD-10-CM | POA: Diagnosis not present

## 2018-09-03 DIAGNOSIS — B009 Herpesviral infection, unspecified: Secondary | ICD-10-CM

## 2018-09-03 DIAGNOSIS — Z0001 Encounter for general adult medical examination with abnormal findings: Secondary | ICD-10-CM

## 2018-09-03 DIAGNOSIS — F17200 Nicotine dependence, unspecified, uncomplicated: Secondary | ICD-10-CM

## 2018-09-03 DIAGNOSIS — Z0000 Encounter for general adult medical examination without abnormal findings: Secondary | ICD-10-CM

## 2018-09-03 DIAGNOSIS — Z6823 Body mass index (BMI) 23.0-23.9, adult: Secondary | ICD-10-CM | POA: Diagnosis not present

## 2018-09-03 DIAGNOSIS — F4321 Adjustment disorder with depressed mood: Secondary | ICD-10-CM | POA: Insufficient documentation

## 2018-09-03 MED ORDER — TRAZODONE HCL 50 MG PO TABS: ORAL_TABLET | ORAL | 2 refills | Status: DC

## 2018-09-03 MED ORDER — ACYCLOVIR 5 % EX CREA: 1.0000 "application " | TOPICAL_CREAM | CUTANEOUS | 2 refills | Status: DC

## 2018-09-06 ENCOUNTER — Telehealth: Payer: Self-pay

## 2018-09-06 ENCOUNTER — Other Ambulatory Visit: Payer: Self-pay

## 2018-09-06 NOTE — Telephone Encounter (Signed)
Patient is going to call insurance company about the acyclovir and also call other pharmacies to check on if they will take Good Rx or not. Will call our office back.

## 2018-09-06 NOTE — Telephone Encounter (Signed)
Pharmacy states that insurance is charging a $90.00 copay, patient cannot afford this. Requesting a substitute. Please advise.

## 2019-04-04 ENCOUNTER — Encounter: Payer: Self-pay | Admitting: Physician Assistant

## 2019-04-04 NOTE — Progress Notes (Deleted)
Virtual Visit via Telephone Note  I connected with [name] on 04/04/19 at 10:00 AM EST by virtual visit by televideo modality and verified that I am speaking with the correct person using two identifiers.   I discussed the limitations, risks, security and privacy concerns of performing an evaluation and management service  and the availability of in person appointments. I also discussed with the patient that there may be a patient responsible charge related to this service. The patient expressed understanding and agreed to proceed.    I discussed the assessment and treatment plan with the patient. The patient was provided an opportunity to ask questions and all were answered. The patient agreed with the plan and demonstrated an understanding of the instructions.   The patient was advised to call back or seek an in-person evaluation if the symptoms worsen or if the condition fails to improve as anticipated.  I provided 30 minutes of non-face-to-face time during this encounter.   Vicie Mutters, PA-C     COMPLETE PHYSICAL  Assessment:   CPE 1 year  Hyperlipidemia, unspecified hyperlipidemia type Absolutely declines cholesterol medications but very receptive to lifestyle modification recommendations. Discussed current diet in detail, recommend she cut down on crackers/cookies, processed foods, continue to limit animal fats, push high fiber items, discussed soluble fiber supplement.  Check lipids at next visit  TOBACCO ABUSE Smoking cessation-  instruction/counseling given, counseled patient on the dangers of tobacco use, advised patient to stop smoking, and reviewed strategies to maximize success, patient is gradually cutting down.  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  HSV-1 infection -     acyclovir cream (ZOVIRAX) 5 %; Apply 1 application topically every 3 (three) hours.  Insomnia secondary to situational depression -     traZODone (DESYREL) 50 MG tablet; 1/2-1 tablet for sleep    Over 40 minutes of exam, counseling, chart review and critical decision making was performed Future Appointments  Date Time Provider [place]  04/06/2019 10:00 AM Vicie Mutters, PA-C [facility] None  09/08/2019  9:00 AM Liane Comber, NP [facility] None  04/05/2020  9:00 AM Vicie Mutters, PA-C [facility] None     Subjective:  [name] is a 68 y.o. female who presents for CPE and follow up for HTN, smoking history, cholesterol.   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 She takes colace, no constipation/diarrhea. She follows with Dr. Ammie Dalton.   she currently continues to smoke 1 pack a week; discussed risks associated with smoking, patient is ready to quit and is cutting down gradually.     She does not check BP at home; today their BP is   n/a She does workout, she does [employer] 3-4 days a week. She denies chest pain, shortness of breath, dizziness.   BMI is There is no height or weight on file to calculate BMI., she  is working on diet and exercise. Wt Readings from Last 3 Encounters:  03/31/18 112 lb (50.8 kg)  03/19/17 105 lb 12.8 oz (48 kg)  01/22/17 104 lb 6.4 oz (47.4 kg)   She is not on cholesterol medication, has declined statin despite counseling  and discussion of risks and benefits. She is eating salads, vegetables, baked proteins, using olive oil.  Her cholesterol is not at goal. The cholesterol last visit was:   Lab Results  Component Value Date   CHOL 263 (H) 03/31/2018   HDL 75 03/31/2018   LDLCALC 166 (H) 03/31/2018   LDLDIRECT 166.3 03/05/2011   TRIG 104 03/31/2018   CHOLHDL 3.5 03/31/2018    Last A1C in the office was:   Lab Results  Component Value Date   HGBA1C 5.3 11/09/2015   Last GFR: Lab Results  Component Value Date   GFRNONAA 92 03/31/2018   Patient is not on Vitamin D supplement but willing to start;    Lab Results  Component Value Date   VD25OH 31 03/19/2017       Medication Review:    Current Outpatient Medications (Respiratory):  .  mometasone (NASONEX) 50 MCG/ACT nasal spray, Place 2 sprays into the nose daily.    Current Outpatient Medications (Other):  .  acyclovir cream (ZOVIRAX) 5 %, Apply 1 application topically every 3 (three) hours. .  docusate sodium (COLACE) 100 MG capsule, Take 200 mg by mouth at bedtime. .  lidocaine (XYLOCAINE) 2 % solution, Use as directed 20 mLs in the mouth or throat as needed for mouth pain. .  traZODone (DESYREL) 50 MG tablet, 1/2-1 tablet for sleep  No Known Allergies  Current Problems (verified) Patient Active Problem List   Diagnosis Date Noted  . 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  . Insomnia secondary to situational depression 09/03/2018  . Vitamin D deficiency 09/03/2018  . Atherosclerosis of aorta ([place]) 03/31/2018  . Personal history of benign carcinoid tumor 03/19/2017  . 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 External hemorrhoids with complication [id number]  . TOBACCO ABUSE 08/16/2007  . RENAL CALCULUS 08/16/2007  . Hyperlipidemia 08/09/2007    Screening Tests Immunization History  Administered Date(s) Administered  . Influenza, High Dose Seasonal PF 02/11/2018  . Pneumococcal Conjugate-13 04/30/2015  .  Pneumococcal Polysaccharide-23 03/31/2018  . Td 08/02/1996  . 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 [facility] here for primary care 2. Last Eye exam:  [facility], 2018, due  3. Last dental exam: upper dentures, partial on bottom  Patient Care Team: Unk Pinto, MD as PCP - General (Internal Medicine)  SURGICAL HISTORY She  has a past surgical history that includes Vaginal hysterectomy; Knee surgery (07/2006); Breast cyst excision; Colonoscopy (nov 2011); laparoscopy (N/A, 12/10/2016); and laparotomy (N/A, 12/10/2016). FAMILY HISTORY Her family history includes Atrial fibrillation in her mother; Cancer in her maternal grandmother, paternal grandfather, and paternal uncle; Heart disease in her father; Hypertension in her father; Thyroid disease in her mother.  MGM leukemia, PGF leukemia, P uncle ? Lymphoma DAD with heart disease age 66 Mom with pacemaker, dementia Sister with  MS  SOCIAL HISTORY She  reports that she has been smoking cigarettes. She has smoked for the past 30.00 years. She has never used smokeless tobacco. She reports that she does not drink alcohol or use drugs. A pack every 2 weeks.    Review of Systems  Constitutional: Negative.   HENT: Negative.   Eyes: Negative.   Respiratory: Negative.   Cardiovascular: Negative.   Gastrointestinal: Negative.   Genitourinary: Negative.   Musculoskeletal: Negative.   Skin: Negative.   Neurological: Negative.   Endo/Heme/Allergies: Negative.   Psychiatric/Behavioral: Positive for depression. Negative for substance  abuse and suicidal ideas. The patient has insomnia. The patient is not nervous/anxious.      Objective:     There were no vitals filed for this visit. There is no height or weight on file to calculate BMI.  General appearance: alert, no distress, WD/WN, female HEENT: normocephalic, sclerae anicteric, TMs pearly, nares patent, no discharge or erythema, pharynx normal Oral cavity: MMM, no lesions Neck: supple, no lymphadenopathy, no thyromegaly, no masses Heart: RRR, normal S1, S2, no murmurs Lungs: CTA bilaterally, no wheezes, rhonchi, or rales Abdomen: +bs, soft, non tender, non distended, no masses, no hepatomegaly, no splenomegaly Musculoskeletal: nontender, no swelling, no obvious deformity Extremities: no edema, no cyanosis, no clubbing Pulses: 2+ symmetric, upper and lower extremities, normal cap refill Neurological: alert, oriented x 3, CN2-12 intact, strength normal upper extremities and lower extremities, sensation normal throughout, DTRs 2+ throughout, no cerebellar signs, gait normal Psychiatric: normal affect, behavior normal, pleasant    Vicie Mutters, PA-C   04/04/2019

## 2019-04-06 ENCOUNTER — Encounter: Payer: Self-pay | Admitting: Physician Assistant

## 2019-04-08 DIAGNOSIS — H2511 Age-related nuclear cataract, right eye: Secondary | ICD-10-CM | POA: Diagnosis not present

## 2019-04-08 DIAGNOSIS — H43812 Vitreous degeneration, left eye: Secondary | ICD-10-CM | POA: Diagnosis not present

## 2019-04-08 DIAGNOSIS — Z01818 Encounter for other preprocedural examination: Secondary | ICD-10-CM | POA: Diagnosis not present

## 2019-04-08 DIAGNOSIS — H52223 Regular astigmatism, bilateral: Secondary | ICD-10-CM | POA: Diagnosis not present

## 2019-04-22 DIAGNOSIS — H2511 Age-related nuclear cataract, right eye: Secondary | ICD-10-CM | POA: Diagnosis not present

## 2019-04-22 DIAGNOSIS — H25811 Combined forms of age-related cataract, right eye: Secondary | ICD-10-CM | POA: Diagnosis not present

## 2019-05-06 DIAGNOSIS — H2512 Age-related nuclear cataract, left eye: Secondary | ICD-10-CM | POA: Diagnosis not present

## 2019-05-06 DIAGNOSIS — H25812 Combined forms of age-related cataract, left eye: Secondary | ICD-10-CM | POA: Diagnosis not present

## 2019-07-30 ENCOUNTER — Other Ambulatory Visit: Payer: Self-pay | Admitting: Adult Health

## 2019-07-30 DIAGNOSIS — F4321 Adjustment disorder with depressed mood: Secondary | ICD-10-CM

## 2019-09-07 NOTE — Progress Notes (Signed)
MEDICARE ANNUAL WELLNESS VISIT  Assessment:    Encounter for Medicare annual wellness exam 1 year  Atherosclerosis of aorta Per Abd CT 2014 Control blood pressure, cholesterol, glucose, increase exercise.   Hyperlipidemia, unspecified hyperlipidemia type Absolutely declines cholesterol medications but very receptive to lifestyle modification recommendations. Discussed current diet in detail, recommend she cut down on crackers/cookies, processed foods, continue to limit animal fats, push high fiber items, discussed soluble fiber supplement.  Check lipids  TOBACCO ABUSE Smoking cessation-  instruction/counseling given, counseled patient on the dangers of tobacco use, advised patient to stop smoking, and reviewed strategies to maximize success, patient is gradually cutting down.  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  HSV-1 infection -     acyclovir cream (ZOVIRAX) 5 %; Apply 1 application topically every 3 (three) hours.  Insomnia secondary to situational depression She declines SSRI or wellbutrin; continue trazodone which is helpful  good sleep hygiene discussed, increase day time activity, try melatonin or benadryl -     traZODone (DESYREL) 50 MG tablet; 1/2-1 tablet for sleep    Over 40 minutes of exam, counseling, chart review and critical decision making was performed Future Appointments  Date Time Provider [place]  04/05/2020  9:00 AM Vicie Mutters, PA-C [facility] None     Plan:   During the course of the  visit the patient was educated and counseled about appropriate screening and preventive services including:    Pneumococcal vaccine   Prevnar 13  Influenza vaccine  Td vaccine  Screening electrocardiogram  Bone densitometry screening  Colorectal cancer screening  Diabetes screening  Glaucoma screening  Nutrition counseling   Advanced directives: requested   Subjective:  [name] is a 69 y.o. female who presents for Medicare Annual Wellness Visit.  [name], [name] passed away in 07/09/22. She is tearful, spending more time with daughter and grandson a lot. She is tearful but reports she is working through this, feels she is improving. Has been doing grief counseling with minister.   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 She takes colace, no constipation/diarrhea. She follows with Dr. Ammie Dalton.   she currently continues to smoke 1 pack a week; intermittently since 1979, never more than 0.25 pack/day, discussed risks associated with smoking, patient is ready to quit and is cutting down gradually. She has a 30+ pack year history, last screening was negative CXR in 07-09-16     She has abdominal aortic atherosclerosis per CT 2014 She does not check BP at home; today their BP is BP: 128/86  She does workout, she walks with friend 3-4 days a week She denies chest pain, shortness of breath, dizziness.   BMI is Body mass index is 23.63 kg/m., she is working on diet and exercise. Wt Readings from Last 3 Encounters:  09/08/19 115 lb (52.2 kg)  03/31/18 112  lb (50.8 kg)  03/19/17 105 lb 12.8 oz (48 kg)   She is not on cholesterol medication, has declined statin despite counseling and discussion of risks and benefits. She is eating  salads, vegetables, baked proteins, using olive oil.  Her cholesterol is not at goal. The cholesterol last visit was:   Lab Results  Component Value Date   CHOL 263 (H) 03/31/2018   HDL 75 03/31/2018   LDLCALC 166 (H) 03/31/2018   LDLDIRECT 166.3 03/05/2011   TRIG 104 03/31/2018   CHOLHDL 3.5 03/31/2018    Last A1C in the office was:   Lab Results  Component Value Date   HGBA1C 5.3 11/09/2015   Last GFR: Lab Results  Component Value Date   GFRNONAA 92 03/31/2018   Patient is not on Vitamin D supplement but willing to start;    Lab Results  Component Value Date   VD25OH 31 03/19/2017       Medication Review: Current Outpatient Medications on File Prior to Visit  Medication Sig Dispense Refill  . traZODone (DESYREL) 50 MG tablet TAKE 1/2 TO 1 TABLET BY MOUTH FOR SLEEP 30 tablet 2  . acyclovir cream (ZOVIRAX) 5 % Apply 1 application topically every 3 (three) hours. 15 g 2  . docusate sodium (COLACE) 100 MG capsule Take 200 mg by mouth at bedtime.    . lidocaine (XYLOCAINE) 2 % solution Use as directed 20 mLs in the mouth or throat as needed for mouth pain. 100 mL 3  . mometasone (NASONEX) 50 MCG/ACT nasal spray Place 2 sprays into the nose daily. 17 g 2   No current facility-administered medications on file prior to visit.    No Known Allergies  Current Problems (verified) Patient Active Problem List   Diagnosis Date Noted  . 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  . Insomnia secondary to situational depression 09/03/2018  . Vitamin D deficiency 09/03/2018  . Atherosclerosis of aorta ([place]) 03/31/2018  . Personal history of benign carcinoid tumor 03/19/2017  . External hemorrhoids with complication 99991111  . RENAL CALCULUS 08/16/2007  . Hyperlipidemia 08/09/2007    Screening Tests Immunization History  Administered Date(s) Administered  . Influenza, High Dose Seasonal PF 02/11/2018  . PFIZER SARS-COV-2 Vaccination 07/12/2019, 08/02/2019  . Pneumococcal Conjugate-13  04/30/2015  . Pneumococcal Polysaccharide-23 03/31/2018  . Td 08/02/1996  . Tdap 03/10/2011   Tetanus:  2012 Prevnar 13:  2016 Pneumonia: 2019 Influenza: 02/2019 Shingles: interested in shingrix Covid 19: 2/2, 2021  Pap: 2016 neg HPV normal PAP declines another MGM: 10/31/15 DUE, patient will schedule  DEXA: ordered, schedule with mammogram   Colonoscopy: 2012  CXR 05/2016  CT abd: 2014, abdominal aortic atherosclerosis   Names of Other Physician/Practitioners you currently use: 1. [facility] here for primary care 2. Last Eye exam:  [facility] specialist, 2021 3. Last dental exam: upper dentures, partial on bottom  Patient Care Team: Unk Pinto, MD as PCP - General (Internal Medicine)  SURGICAL HISTORY She  has a past surgical history that includes Vaginal hysterectomy; Knee surgery (07/2006); Breast cyst excision; Colonoscopy (nov 2011); laparoscopy (N/A, 12/10/2016); and laparotomy (N/A, 12/10/2016). FAMILY HISTORY Her family history includes Atrial fibrillation in her mother; Cancer in her maternal grandmother, paternal grandfather, and paternal uncle; Heart disease in her father; Hypertension in her father; Thyroid disease in her mother.   SOCIAL HISTORY She  reports that she has been smoking cigarettes. She has smoked for the past 30.00 years. She has never used smokeless tobacco.  She reports that she does not drink alcohol or use drugs.    MEDICARE WELLNESS OBJECTIVES: Physical activity: Current Exercise Habits: Home exercise routine, Type of exercise: walking, Time (Minutes): 40, Frequency (Times/Week): 4, Weekly Exercise (Minutes/Week): 160, Intensity: Mild, Exercise limited by: None identified Cardiac risk factors: Cardiac Risk Factors include: advanced age (>67men, >46 women) Depression/mood screen:   Depression screen [facility] 2/9 09/03/2018  Decreased Interest 3  Down, Depressed, Hopeless 2  PHQ - 2 Score 5  Altered  sleeping 3  Tired, decreased energy 3  Change in appetite 0  Feeling bad or failure about yourself  1  Trouble concentrating 1  Moving slowly or fidgety/restless 0  Suicidal thoughts 0  PHQ-9 Score 13  Difficult doing work/chores Very difficult    ADLs:  In your present state of health, do you have any difficulty performing the following activities: 09/08/2019  Hearing? N  Comment has bil hearing aids  Vision? N  Difficulty concentrating or making decisions? N  Walking or climbing stairs? N  Dressing or bathing? N  Doing errands, shopping? N  Some recent data might be hidden     Cognitive Testing  Alert? Yes  Normal Appearance?Yes  Oriented to person? Yes  Place? Yes   Time? Yes  Recall of three objects?  Yes  Can perform simple calculations? Yes  Displays appropriate judgment?Yes  Can read the correct time from a watch face?Yes  EOL planning: Does Patient Have a Medical Advance Directive?: No Would patient like information on creating a medical advance directive?: No - Patient declined  Review of Systems  Constitutional: Negative.   HENT: Negative.   Eyes: Negative.   Respiratory: Negative.   Cardiovascular: Negative.   Gastrointestinal: Negative.   Genitourinary: Negative.   Musculoskeletal: Negative.   Skin: Negative.   Neurological: Negative.   Endo/Heme/Allergies: Negative.   Psychiatric/Behavioral: Positive for depression. Negative for substance abuse and suicidal ideas. The patient has insomnia. The patient is not nervous/anxious.      Objective:     Today's Vitals   09/08/19 0852 09/08/19 0923  BP:  128/86  Pulse: 79   Temp: 97.9 F (36.6 C)   SpO2: 97%   Weight: 115 lb (52.2 kg)    Body mass index is 23.63 kg/m.  General appearance: alert, no distress, WD/WN, female HEENT: normocephalic, sclerae anicteric, TMs pearly, nares patent, no discharge or erythema, pharynx normal Oral cavity: MMM, no lesions Neck: supple, no lymphadenopathy, no  thyromegaly, no masses Heart: RRR, normal S1, S2, no murmurs Lungs: CTA bilaterally, no wheezes, rhonchi, or rales Abdomen: +bs, soft, non tender, non distended, no masses, no hepatomegaly, no splenomegaly Musculoskeletal: nontender, no swelling, no obvious deformity Extremities: no edema, no cyanosis, no clubbing Pulses: 2+ symmetric, upper and lower extremities, normal cap refill Neurological: alert, oriented x 3, CN2-12 intact, strength normal upper extremities and lower extremities, sensation normal throughout, DTRs 2+ throughout, no cerebellar signs, gait normal Psychiatric: normal affect, intermittently tearful, behavior normal, pleasant    Medicare Attestation I have personally reviewed: The patient's medical and social history Their use of alcohol, tobacco or illicit drugs Their current medications and supplements The patient's functional ability including ADLs,fall risks, home safety risks, cognitive, and hearing and visual impairment Diet and physical activities Evidence for depression or mood disorders  The patient's weight, height, BMI, and visual acuity have been recorded in the chart.  I have made referrals, counseling, and provided education to the patient based on review of the above and I  have provided the patient with a written personalized care plan for preventive services.     Izora Ribas, NP   09/08/2019

## 2019-09-08 ENCOUNTER — Other Ambulatory Visit: Payer: Self-pay

## 2019-09-08 ENCOUNTER — Ambulatory Visit (INDEPENDENT_AMBULATORY_CARE_PROVIDER_SITE_OTHER): Payer: PPO | Admitting: Adult Health

## 2019-09-08 ENCOUNTER — Encounter: Payer: Self-pay | Admitting: Adult Health

## 2019-09-08 VITALS — BP 128/86 | HR 79 | Temp 97.9°F | Wt 115.0 lb

## 2019-09-08 DIAGNOSIS — F4321 Adjustment disorder with depressed mood: Secondary | ICD-10-CM | POA: Diagnosis not present

## 2019-09-08 DIAGNOSIS — E785 Hyperlipidemia, unspecified: Secondary | ICD-10-CM

## 2019-09-08 DIAGNOSIS — F5105 Insomnia due to other mental disorder: Secondary | ICD-10-CM

## 2019-09-08 DIAGNOSIS — I700 Atherosclerosis of aorta: Secondary | ICD-10-CM

## 2019-09-08 DIAGNOSIS — Z79899 Other long term (current) drug therapy: Secondary | ICD-10-CM

## 2019-09-08 DIAGNOSIS — N200 Calculus of kidney: Secondary | ICD-10-CM

## 2019-09-08 DIAGNOSIS — R6889 Other general symptoms and signs: Secondary | ICD-10-CM

## 2019-09-08 DIAGNOSIS — E2839 Other primary ovarian failure: Secondary | ICD-10-CM

## 2019-09-08 DIAGNOSIS — Z0001 Encounter for general adult medical examination with abnormal findings: Secondary | ICD-10-CM

## 2019-09-08 DIAGNOSIS — B009 Herpesviral infection, unspecified: Secondary | ICD-10-CM | POA: Diagnosis not present

## 2019-09-08 DIAGNOSIS — F17200 Nicotine dependence, unspecified, uncomplicated: Secondary | ICD-10-CM | POA: Diagnosis not present

## 2019-09-08 DIAGNOSIS — K644 Residual hemorrhoidal skin tags: Secondary | ICD-10-CM | POA: Diagnosis not present

## 2019-09-08 DIAGNOSIS — Z86012 Personal history of benign carcinoid tumor: Secondary | ICD-10-CM

## 2019-09-08 DIAGNOSIS — E559 Vitamin D deficiency, unspecified: Secondary | ICD-10-CM

## 2019-09-08 DIAGNOSIS — Z6823 Body mass index (BMI) 23.0-23.9, adult: Secondary | ICD-10-CM | POA: Diagnosis not present

## 2019-09-08 DIAGNOSIS — Z0000 Encounter for general adult medical examination without abnormal findings: Secondary | ICD-10-CM

## 2019-09-08 NOTE — Patient Instructions (Addendum)
  Ms. Monter , Thank you for taking time to come for your Medicare Wellness Visit. I appreciate your ongoing commitment to your health goals. Please review the following plan we discussed and let me know if I can assist you in the future.   These are the goals we discussed: Goals    . LDL CALC < 100    . Quit Smoking       This is a list of the screening recommended for you and due dates:  Health Maintenance  Topic Date Due  . Mammogram  10/25/2017  . DEXA scan (bone density measurement)  Never done  .  Hepatitis C: One time screening is recommended by Center for Disease Control  (CDC) for  adults born from [birth year] through 1963.   09/16/2019*  . Flu Shot  12/18/2019  . Colon Cancer Screening  09/04/2020  . Tetanus Vaccine  03/09/2021  . COVID-19 Vaccine  Completed  . Pneumonia vaccines  Completed  *Topic was postponed. The date shown is not the original due date.     Ask insurance about shingrix coverage - can get at [facility] or [facility]  Please get your chest xray at [address] [facility]   HOW TO SCHEDULE A MAMMOGRAM Schedule with bone density   [facility]  7 a.m.-6:30 p.m., Monday 7 a.m.-5 p.m., Tuesday-Friday Schedule an appointment by calling [phone].      SMOKING CESSATION  [employer] society  [phone] for more information or for a free program for smoking cessation help.   You can call QUIT SMART 1-800-QUIT-NOW for free nicotine patches or replacement therapy- if they are out- keep calling  [facility] Can call for smoking cessation classes, [phone]  If you have a smart phone, please look up Smoke Free app, this will help you stay on track and give you information about money you have saved, life that you have gained back and a ton of more information.     ADVANTAGES OF QUITTING SMOKING  Within 20 minutes, blood pressure decreases. Your pulse is at normal level.  After 8 hours, carbon  monoxide levels in the blood return to normal. Your oxygen level increases.  After 24 hours, the chance of having a heart attack starts to decrease. Your breath, hair, and body stop smelling like smoke.  After 48 hours, damaged nerve endings begin to recover. Your sense of taste and smell improve.  After 72 hours, the body is virtually free of nicotine. Your bronchial tubes relax and breathing becomes easier.  After 2 to 12 weeks, lungs can hold more air. Exercise becomes easier and circulation improves.  After 1 year, the risk of coronary heart disease is cut in half.  After 5 years, the risk of stroke falls to the same as a nonsmoker.  After 10 years, the risk of lung cancer is cut in half and the risk of other cancers decreases significantly.  After 15 years, the risk of coronary heart disease drops, usually to the level of a nonsmoker.  You will have extra money to spend on things other than cigarettes.

## 2019-09-09 LAB — COMPLETE METABOLIC PANEL WITH GFR
AG Ratio: 2.1 (calc) (ref 1.0–2.5)
ALT: 11 U/L (ref 6–29)
AST: 15 U/L (ref 10–35)
Albumin: 4.5 g/dL (ref 3.6–5.1)
Alkaline phosphatase (APISO): 72 U/L (ref 37–153)
BUN: 13 mg/dL (ref 7–25)
CO2: 27 mmol/L (ref 20–32)
Calcium: 9.6 mg/dL (ref 8.6–10.4)
Chloride: 107 mmol/L (ref 98–110)
Creat: 0.61 mg/dL (ref 0.50–0.99)
GFR, Est African American: 109 mL/min/{1.73_m2} (ref >=60)
GFR, Est Non African American: 94 mL/min/{1.73_m2} (ref >=60)
Globulin: 2.1 g/dL (calc) (ref 1.9–3.7)
Glucose, Bld: 82 mg/dL (ref 65–99)
Potassium: 4.7 mmol/L (ref 3.5–5.3)
Sodium: 141 mmol/L (ref 135–146)
Total Bilirubin: 0.4 mg/dL (ref 0.2–1.2)
Total Protein: 6.6 g/dL (ref 6.1–8.1)

## 2019-09-09 LAB — LIPID PANEL
Cholesterol: 264 mg/dL — ABNORMAL HIGH (ref <200)
HDL: 77 mg/dL (ref >=50)
LDL Cholesterol (Calc): 162 mg/dL (calc) — ABNORMAL HIGH
Non-HDL Cholesterol (Calc): 187 mg/dL (calc) — ABNORMAL HIGH (ref <130)
Total CHOL/HDL Ratio: 3.4 (calc) (ref <5.0)
Triglycerides: 125 mg/dL (ref <150)

## 2019-09-09 LAB — CBC WITH DIFFERENTIAL/PLATELET
Absolute Monocytes: 437 cells/uL (ref 200–950)
Basophils Absolute: 81 cells/uL (ref 0–200)
Basophils Relative: 1.5 %
Eosinophils Absolute: 70 cells/uL (ref 15–500)
Eosinophils Relative: 1.3 %
HCT: 44 % (ref 35.0–45.0)
Hemoglobin: 14.5 g/dL (ref 11.7–15.5)
Lymphs Abs: 1231 cells/uL (ref 850–3900)
MCH: 30.9 pg (ref 27.0–33.0)
MCHC: 33 g/dL (ref 32.0–36.0)
MCV: 93.8 fL (ref 80.0–100.0)
MPV: 10.6 fL (ref 7.5–12.5)
Monocytes Relative: 8.1 %
Neutro Abs: 3580 cells/uL (ref 1500–7800)
Neutrophils Relative %: 66.3 %
Platelets: 261 10*3/uL (ref 140–400)
RBC: 4.69 10*6/uL (ref 3.80–5.10)
RDW: 11.9 % (ref 11.0–15.0)
Total Lymphocyte: 22.8 %
WBC: 5.4 10*3/uL (ref 3.8–10.8)

## 2019-09-09 LAB — MAGNESIUM: Magnesium: 2.3 mg/dL (ref 1.5–2.5)

## 2019-09-09 LAB — TSH: TSH: 4.32 mIU/L (ref 0.40–4.50)

## 2019-11-14 ENCOUNTER — Other Ambulatory Visit: Payer: Self-pay

## 2019-11-14 DIAGNOSIS — F4321 Adjustment disorder with depressed mood: Secondary | ICD-10-CM

## 2019-11-14 MED ORDER — TRAZODONE HCL 50 MG PO TABS: ORAL_TABLET | ORAL | 0 refills | Status: DC

## 2019-12-06 DIAGNOSIS — H43811 Vitreous degeneration, right eye: Secondary | ICD-10-CM | POA: Diagnosis not present

## 2020-02-02 ENCOUNTER — Other Ambulatory Visit: Payer: Self-pay | Admitting: Adult Health

## 2020-02-02 ENCOUNTER — Other Ambulatory Visit: Payer: Self-pay | Admitting: Internal Medicine

## 2020-02-02 DIAGNOSIS — Z1231 Encounter for screening mammogram for malignant neoplasm of breast: Secondary | ICD-10-CM

## 2020-02-07 ENCOUNTER — Encounter: Payer: PPO | Admitting: Physician Assistant

## 2020-02-14 ENCOUNTER — Ambulatory Visit
Admission: RE | Admit: 2020-02-14 | Discharge: 2020-02-14 | Disposition: A | Discharge status: Home / Self Care | Payer: PRIVATE HEALTH INSURANCE | Source: Ambulatory Visit | Attending: Internal Medicine | Admitting: Internal Medicine

## 2020-02-14 ENCOUNTER — Other Ambulatory Visit: Payer: Self-pay

## 2020-02-14 DIAGNOSIS — Z1231 Encounter for screening mammogram for malignant neoplasm of breast: Secondary | ICD-10-CM

## 2020-02-22 ENCOUNTER — Encounter: Payer: PPO | Admitting: Adult Health

## 2020-02-22 ENCOUNTER — Ambulatory Visit: Payer: PRIVATE HEALTH INSURANCE

## 2020-02-22 DIAGNOSIS — F17200 Nicotine dependence, unspecified, uncomplicated: Secondary | ICD-10-CM | POA: Insufficient documentation

## 2020-02-22 DIAGNOSIS — E538 Deficiency of other specified B group vitamins: Secondary | ICD-10-CM | POA: Insufficient documentation

## 2020-02-22 NOTE — Progress Notes (Signed)
CPE  Assessment:    Encounter for routine physical exam/screening 1 year  Atherosclerosis of aorta Per Abd CT 2014 Control blood pressure, cholesterol, glucose, increase exercise.   Hyperlipidemia, unspecified hyperlipidemia type Absolutely declines cholesterol medications but very receptive to lifestyle modification recommendations. Discussed current diet in detail, recommend she cut down on crackers/cookies, processed foods, continue to limit animal fats, push high fiber items, discussed soluble fiber supplement.  Check lipids  TOBACCO ABUSE  Smoking cessation-  instruction/counseling given, counseled patient on the dangers of tobacco use, advised patient to stop smoking, and reviewed strategies to maximize success, patient is gradually cutting down.  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  HSV-1 infection -     acyclovir cream (ZOVIRAX) 5 %; Apply 1 application topically every 3 (three) hours.  Insomnia secondary to situational depression She declines SSRI or wellbutrin; continue trazodone which is helpful  good sleep hygiene discussed, increase day time activity, try melatonin or benadryl -     traZODone (DESYREL) 50 MG tablet; 1-1.5 tab nightly for sleep and mood  B12 deficiency Check levels and supplement as indicated   Need for influenza vaccine Quadrivalent high dose influenza vaccine administered without complication today   Orders Placed This Encounter  Procedures   CBC with Differential/Platelet   COMPLETE METABOLIC PANEL WITH GFR    Magnesium   Lipid panel   TSH   Hemoglobin A1c   VITAMIN D 25 Hydroxy (Vit-D Deficiency, Fractures)   Vitamin B12   Microalbumin / creatinine urine ratio   Urinalysis, Routine w reflex microscopic   EKG 12-Lead     Over 40 minutes of exam, counseling, chart review and critical decision making was performed Future Appointments  Date Time Provider Dwight  02/27/2021  2:00 PM Liane Comber, NP [facility] None     Plan:   During the course of the visit the patient was educated and counseled about appropriate screening and preventive services including:    Pneumococcal vaccine   Prevnar 13  Influenza vaccine  Td vaccine  Screening electrocardiogram  Bone densitometry screening  Colorectal cancer screening  Diabetes screening  Glaucoma screening  Nutrition counseling   Advanced directives: requested   Subjective:  [name] is a 69 y.o. female who presents for CPE. She has Hyperlipidemia; RENAL CALCULUS; External hemorrhoids with complication; Personal history of benign carcinoid tumor; Atherosclerosis of aorta ([place]); HSV-1 infection; Insomnia secondary to situational depression; Vitamin D deficiency; B12 deficiency; and Smoker on their problem list.  [name], [name] passed away in 07-07-19. She is tearful, spending more time with daughter and grandson a lot. She is tearful but reports she is working through this. Has been doing grief counseling with minister. She is prescribed trazodone, taking 50 mg.   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 She takes colace, no constipation/diarrhea. She follows with Dr. Ammie Dalton.   she currently continues  to smoke 1 pack a week; intermittently since  1979, never more than 0.25 pack/day, discussed risks associated with smoking, patient is ready to quit and is cutting down gradually. She has a <5 pack year history. Denies dyspnea, wheezing, cough, nights sweats, fatigue.      She does not check BP at home; today their BP is BP: 116/64  She does workout, she walks with friend 3-4 days a week She denies chest pain, shortness of breath, dizziness.   BMI is Body mass index is 22.72 kg/m., she is working on diet and exercise. Eats a lot of chicken, will have veggie, some beans, some fruit 1 sweet tea a day, and 2-3 bottles of water, 2 cups of coffee, black  No alcohol  Wt Readings from Last 3 Encounters:  02/23/20 110 lb 9.6 oz (50.2 kg)  09/08/19 115 lb (52.2 kg)  03/31/18 112 lb (50.8 kg)   She has abdominal aortic atherosclerosis per CT 2014  She is not on cholesterol medication, has declined statin or other med despite counseling and discussion of risks and benefits. She is eating salads, vegetables, baked proteins, using olive oil. She reports has been taking red yeast rice/herbal.    Her cholesterol is not at goal. The cholesterol last visit was:   Lab Results  Component Value Date   CHOL 264 (H) 09/08/2019   HDL 77 09/08/2019   LDLCALC 162 (H) 09/08/2019   LDLDIRECT 166.3 03/05/2011   TRIG 125 09/08/2019   CHOLHDL 3.4 09/08/2019    Last A1C in the office was:   Lab Results  Component Value Date   HGBA1C 5.3 11/09/2015   Last GFR: Lab Results  Component Value Date   GFRNONAA 94 09/08/2019   Patient is not on Vitamin D supplement but willing to start;    Lab Results  Component Value Date   VD25OH 31 03/19/2017        Lab Results  Component Value Date   VITAMINB12 273 03/19/2017    Medication Review: No current outpatient medications on file prior to visit.   No current facility-administered medications on file prior to visit.    No Known Allergies  Current Problems (verified) Patient Active Problem List    Diagnosis Date Noted   B12 deficiency 02/22/2020   Smoker 02/22/2020   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   Insomnia secondary to situational depression 09/03/2018   Vitamin D deficiency 09/03/2018   Atherosclerosis of aorta ([place]) 03/31/2018   Personal history of benign carcinoid tumor 03/19/2017   External hemorrhoids with complication 29/93/7169   RENAL CALCULUS 08/16/2007   Hyperlipidemia 08/09/2007    Screening Tests Immunization History  Administered Date(s) Administered   Influenza, High Dose Seasonal PF 02/11/2018   Influenza-Unspecified 02/17/2019   PFIZER SARS-COV-2 Vaccination 07/12/2019, 08/02/2019   Pneumococcal Conjugate-13 04/30/2015   Pneumococcal Polysaccharide-23 03/31/2018   Td 08/02/1996   Tdap 03/10/2011   Tetanus:  2012 Prevnar 13:  2016 Pneumonia: 2019 Influenza: 02/2019 Shingles: interested in shingrix Covid 19: 2/2, 2021  Pap: 2016 neg HPV normal PAP declines another MGM: 02/14/2020 DEXA: ordered and pending, couldn't get with mammogram this year  Colonoscopy: 2012  CXR 05/2016, had today  CT abd: 2014, abdominal aortic atherosclerosis   Names of Other Physician/Practitioners you currently use: 1. [facility] here for primary care 2. Last Eye exam:  [facility] specialist, 2021 3. Last dental exam: upper dentures, partial on bottom  Patient Care Team: Unk Pinto, MD as PCP - General (  Internal Medicine)  SURGICAL HISTORY She  has a past surgical history that includes Vaginal hysterectomy; Knee surgery (07/2006); Breast cyst excision; Colonoscopy (nov 2011); laparoscopy (N/A, 12/10/2016); and laparotomy (N/A, 12/10/2016). FAMILY HISTORY Her family history includes Alzheimer's disease (age of onset: 44) in her mother; Atrial fibrillation in her mother; Heart disease in her father; Hypertension in her father; Leukemia in her maternal grandmother, paternal grandfather, and paternal uncle;  Multiple sclerosis in her sister; Thyroid disease in her mother.   SOCIAL HISTORY She  reports that she has been smoking cigarettes. She started smoking about 42 years ago. She has a 2.40 pack-year smoking history. She has never used smokeless tobacco. She reports that she does not drink alcohol and does not use drugs.   Depression/mood screen:   Depression screen PHQ 2/9 02/23/2020  Decreased Interest 0  Down, Depressed, Hopeless 1  PHQ - 2 Score 1  Altered sleeping -  Tired, decreased energy -  Change in appetite -  Feeling bad or failure about yourself  -  Trouble concentrating -  Moving slowly or fidgety/restless -  Suicidal thoughts -  PHQ-9 Score -  Difficult doing work/chores -     Review of Systems  Constitutional: Negative.  Negative for malaise/fatigue and weight loss.  HENT: Negative.  Negative for hearing loss and tinnitus.   Eyes: Negative.  Negative for blurred vision and double vision.  Respiratory: Negative.  Negative for cough, shortness of breath and wheezing.   Cardiovascular: Negative.  Negative for chest pain, palpitations, orthopnea, claudication and leg swelling.  Gastrointestinal: Negative.  Negative for abdominal pain, blood in stool, constipation, diarrhea, heartburn, melena, nausea and vomiting.  Genitourinary: Negative.   Musculoskeletal: Negative.  Negative for joint pain and myalgias.  Skin: Negative.  Negative for rash.  Neurological: Negative.  Negative for dizziness, tingling, sensory change, weakness and headaches.  Endo/Heme/Allergies: Negative.  Negative for polydipsia.  Psychiatric/Behavioral: Negative for depression, substance abuse and suicidal ideas. The patient has insomnia. The patient is not nervous/anxious.   All other systems reviewed and are negative.    Objective:     Today's Vitals   02/23/20 1401  BP: 116/64  Pulse: 77  Temp: (!) 97.3 F (36.3 C)  SpO2: 97%  Weight: 110 lb 9.6 oz (50.2 kg)  Height: 4' 10.5" (1.486 m)    Body mass index is 22.72 kg/m.  General appearance: alert, no distress, WD/WN, female HEENT: normocephalic, sclerae anicteric, TMs pearly, nares patent, no discharge or erythema, pharynx normal Oral cavity: MMM, no lesions Neck: supple, no lymphadenopathy, no thyromegaly, no masses Heart: RRR, normal S1, S2, no murmurs Lungs: CTA bilaterally, no wheezes, rhonchi, or rales Abdomen: +bs, soft, non tender, non distended, no masses, no hepatomegaly, no splenomegaly Musculoskeletal: nontender, no swelling, no obvious deformity Extremities: no edema, no cyanosis, no clubbing Pulses: 2+ symmetric, upper and lower extremities, normal cap refill Neurological: alert, oriented x 3, CN2-12 intact, strength normal upper extremities and lower extremities, sensation normal throughout, DTRs 2+ throughout, no cerebellar signs, gait normal Psychiatric: normal affect, intermittently tearful, behavior normal, pleasant   Breasts: just had normal mammogram; declines today GU: declines  EKG: NSR - No ST changes  The patient's weight, height, BMI, and visual acuity have been recorded in the chart.  I have made referrals, counseling, and provided education to the patient based on review of the above and I have provided the patient with a written personalized care plan for preventive services.     Izora Ribas, NP  02/23/2020  ° ° °

## 2020-02-23 ENCOUNTER — Encounter: Payer: Self-pay | Admitting: Adult Health

## 2020-02-23 ENCOUNTER — Ambulatory Visit (INDEPENDENT_AMBULATORY_CARE_PROVIDER_SITE_OTHER): Payer: PPO | Admitting: Adult Health

## 2020-02-23 ENCOUNTER — Ambulatory Visit
Admission: RE | Admit: 2020-02-23 | Discharge: 2020-02-23 | Disposition: A | Discharge status: Home / Self Care | Payer: Self-pay | Source: Ambulatory Visit | Attending: Adult Health | Admitting: Adult Health

## 2020-02-23 ENCOUNTER — Other Ambulatory Visit: Payer: Self-pay

## 2020-02-23 VITALS — BP 116/64 | HR 77 | Temp 97.3°F | Ht 58.5 in | Wt 110.6 lb

## 2020-02-23 DIAGNOSIS — Z23 Encounter for immunization: Secondary | ICD-10-CM

## 2020-02-23 DIAGNOSIS — F4321 Adjustment disorder with depressed mood: Secondary | ICD-10-CM

## 2020-02-23 DIAGNOSIS — N200 Calculus of kidney: Secondary | ICD-10-CM

## 2020-02-23 DIAGNOSIS — E785 Hyperlipidemia, unspecified: Secondary | ICD-10-CM | POA: Diagnosis not present

## 2020-02-23 DIAGNOSIS — Z8249 Family history of ischemic heart disease and other diseases of the circulatory system: Secondary | ICD-10-CM | POA: Diagnosis not present

## 2020-02-23 DIAGNOSIS — E538 Deficiency of other specified B group vitamins: Secondary | ICD-10-CM | POA: Diagnosis not present

## 2020-02-23 DIAGNOSIS — Z0000 Encounter for general adult medical examination without abnormal findings: Secondary | ICD-10-CM

## 2020-02-23 DIAGNOSIS — R030 Elevated blood-pressure reading, without diagnosis of hypertension: Secondary | ICD-10-CM | POA: Diagnosis not present

## 2020-02-23 DIAGNOSIS — F17200 Nicotine dependence, unspecified, uncomplicated: Secondary | ICD-10-CM

## 2020-02-23 DIAGNOSIS — B009 Herpesviral infection, unspecified: Secondary | ICD-10-CM

## 2020-02-23 DIAGNOSIS — I700 Atherosclerosis of aorta: Secondary | ICD-10-CM | POA: Diagnosis not present

## 2020-02-23 DIAGNOSIS — Z6823 Body mass index (BMI) 23.0-23.9, adult: Secondary | ICD-10-CM

## 2020-02-23 DIAGNOSIS — D649 Anemia, unspecified: Secondary | ICD-10-CM | POA: Diagnosis not present

## 2020-02-23 DIAGNOSIS — Z131 Encounter for screening for diabetes mellitus: Secondary | ICD-10-CM | POA: Diagnosis not present

## 2020-02-23 DIAGNOSIS — Z1329 Encounter for screening for other suspected endocrine disorder: Secondary | ICD-10-CM

## 2020-02-23 DIAGNOSIS — Z86012 Personal history of benign carcinoid tumor: Secondary | ICD-10-CM

## 2020-02-23 DIAGNOSIS — Z136 Encounter for screening for cardiovascular disorders: Secondary | ICD-10-CM

## 2020-02-23 DIAGNOSIS — E559 Vitamin D deficiency, unspecified: Secondary | ICD-10-CM | POA: Diagnosis not present

## 2020-02-23 DIAGNOSIS — Z1389 Encounter for screening for other disorder: Secondary | ICD-10-CM | POA: Diagnosis not present

## 2020-02-23 DIAGNOSIS — K644 Residual hemorrhoidal skin tags: Secondary | ICD-10-CM

## 2020-02-23 MED ORDER — TRAZODONE HCL 50 MG PO TABS: ORAL_TABLET | ORAL | 1 refills | Status: DC

## 2020-02-23 NOTE — Patient Instructions (Addendum)
  Ms. Rankin , Thank you for taking time to come for your Annual Wellness Visit. I appreciate your ongoing commitment to your health goals. Please review the following plan we discussed and let me know if I can assist you in the future.   These are the goals we discussed: Goals    . LDL CALC < 100    . Quit Smoking       This is a list of the screening recommended for you and due dates:  Health Maintenance  Topic Date Due  . DEXA scan (bone density measurement)  Never done  . Flu Shot  12/18/2019  .  Hepatitis C: One time screening is recommended by Center for Disease Control  (CDC) for  adults born from [birth year] through 1963.   02/22/2021*  . Colon Cancer Screening  09/04/2020  . Tetanus Vaccine  03/09/2021  . Mammogram  02/13/2022  . COVID-19 Vaccine  Completed  . Pneumonia vaccines  Completed  *Topic was postponed. The date shown is not the original due date.      Know what a healthy weight is for you (roughly BMI <25) and aim to maintain this  Aim for 7+ servings of fruits and vegetables daily  65-80+ fluid ounces of water or unsweet tea for healthy kidneys  Limit to max 1 drink of alcohol per day; avoid smoking/tobacco  Limit animal fats in diet for cholesterol and heart health - choose grass fed whenever available  Avoid highly processed foods, and foods high in saturated/trans fats  Aim for low stress - take time to unwind and care for your mental health  Aim for 150 min of moderate intensity exercise weekly for heart health, and weights twice weekly for bone health  Aim for 7-9 hours of sleep daily      A great goal to work towards is aiming to get in a serving daily of some of the most nutritionally dense foods - G- BOMBS daily

## 2020-02-23 NOTE — Addendum Note (Signed)
Addended by: Chancy Hurter on: 02/23/2020 03:10 PM   Modules accepted: Orders

## 2020-02-24 LAB — COMPLETE METABOLIC PANEL WITH GFR
AG Ratio: 2.1 (calc) (ref 1.0–2.5)
ALT: 11 U/L (ref 6–29)
AST: 15 U/L (ref 10–35)
Albumin: 4.7 g/dL (ref 3.6–5.1)
Alkaline phosphatase (APISO): 84 U/L (ref 37–153)
BUN: 12 mg/dL (ref 7–25)
CO2: 29 mmol/L (ref 20–32)
Calcium: 9.7 mg/dL (ref 8.6–10.4)
Chloride: 104 mmol/L (ref 98–110)
Creat: 0.75 mg/dL (ref 0.50–0.99)
GFR, Est African American: 96 mL/min/{1.73_m2} (ref >=60)
GFR, Est Non African American: 82 mL/min/{1.73_m2} (ref >=60)
Globulin: 2.2 g/dL (calc) (ref 1.9–3.7)
Glucose, Bld: 81 mg/dL (ref 65–99)
Potassium: 4.1 mmol/L (ref 3.5–5.3)
Sodium: 141 mmol/L (ref 135–146)
Total Bilirubin: 0.4 mg/dL (ref 0.2–1.2)
Total Protein: 6.9 g/dL (ref 6.1–8.1)

## 2020-02-24 LAB — CBC WITH DIFFERENTIAL/PLATELET
Absolute Monocytes: 555 cells/uL (ref 200–950)
Basophils Absolute: 110 cells/uL (ref 0–200)
Basophils Relative: 1.5 %
Eosinophils Absolute: 80 cells/uL (ref 15–500)
Eosinophils Relative: 1.1 %
HCT: 44.8 % (ref 35.0–45.0)
Hemoglobin: 14.9 g/dL (ref 11.7–15.5)
Lymphs Abs: 1650 cells/uL (ref 850–3900)
MCH: 30.7 pg (ref 27.0–33.0)
MCHC: 33.3 g/dL (ref 32.0–36.0)
MCV: 92.2 fL (ref 80.0–100.0)
MPV: 10.5 fL (ref 7.5–12.5)
Monocytes Relative: 7.6 %
Neutro Abs: 4906 cells/uL (ref 1500–7800)
Neutrophils Relative %: 67.2 %
Platelets: 277 10*3/uL (ref 140–400)
RBC: 4.86 10*6/uL (ref 3.80–5.10)
RDW: 11.9 % (ref 11.0–15.0)
Total Lymphocyte: 22.6 %
WBC: 7.3 10*3/uL (ref 3.8–10.8)

## 2020-02-24 LAB — LIPID PANEL
Cholesterol: 305 mg/dL — ABNORMAL HIGH (ref <200)
HDL: 82 mg/dL (ref >=50)
LDL Cholesterol (Calc): 194 mg/dL (calc) — ABNORMAL HIGH
Non-HDL Cholesterol (Calc): 223 mg/dL (calc) — ABNORMAL HIGH (ref <130)
Total CHOL/HDL Ratio: 3.7 (calc) (ref <5.0)
Triglycerides: 140 mg/dL (ref <150)

## 2020-02-24 LAB — URINALYSIS, ROUTINE W REFLEX MICROSCOPIC
Bilirubin Urine: NEGATIVE
Glucose, UA: NEGATIVE
Hgb urine dipstick: NEGATIVE
Leukocytes,Ua: NEGATIVE
Nitrite: NEGATIVE
Protein, ur: NEGATIVE
Specific Gravity, Urine: 1.02 (ref 1.001–1.03)
pH: <=5 (ref 5.0–8.0)

## 2020-02-24 LAB — MAGNESIUM: Magnesium: 2.1 mg/dL (ref 1.5–2.5)

## 2020-02-24 LAB — VITAMIN D 25 HYDROXY (VIT D DEFICIENCY, FRACTURES): Vit D, 25-Hydroxy: 26 ng/mL — ABNORMAL LOW (ref 30–100)

## 2020-02-24 LAB — HEMOGLOBIN A1C
Hgb A1c MFr Bld: 5.2 % of total Hgb (ref <5.7)
Mean Plasma Glucose: 103 (calc)
eAG (mmol/L): 5.7 (calc)

## 2020-02-24 LAB — MICROALBUMIN / CREATININE URINE RATIO
Creatinine, Urine: 193 mg/dL (ref 20–275)
Microalb Creat Ratio: 6 mcg/mg creat (ref <30)
Microalb, Ur: 1.2 mg/dL

## 2020-02-24 LAB — VITAMIN B12: Vitamin B-12: 317 pg/mL (ref 200–1100)

## 2020-02-24 LAB — TSH: TSH: 3.99 mIU/L (ref 0.40–4.50)

## 2020-04-05 ENCOUNTER — Encounter: Payer: PPO | Admitting: Physician Assistant

## 2020-06-07 DIAGNOSIS — H903 Sensorineural hearing loss, bilateral: Secondary | ICD-10-CM | POA: Diagnosis not present

## 2020-07-03 ENCOUNTER — Encounter: Payer: Self-pay | Admitting: Gastroenterology

## 2020-09-03 NOTE — Progress Notes (Deleted)
MEDICARE WELLNESS VISIT AND 6 MONTHS FOLLOW UP  Assessment:    Encounter for Annual Medicare Wellness 1 year  Atherosclerosis of aorta Per Abd CT 2014 Control blood pressure, cholesterol, glucose, increase exercise.   Hyperlipidemia, unspecified hyperlipidemia type Absolutely declines cholesterol medications but very receptive to lifestyle modification recommendations. Discussed current diet in detail, recommend she cut down on crackers/cookies, processed foods, continue to limit animal fats, push high fiber items, discussed soluble fiber supplement. CT coronary ***  Check lipids  TOBACCO ABUSE  Smoking cessation-  instruction/counseling given, counseled patient on the dangers of tobacco use, advised patient to stop smoking, and reviewed strategies to maximize success, patient is gradually cutting down.  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  HSV-1 infection -     acyclovir cream (ZOVIRAX) 5 %; Apply 1 application topically every 3 (three) hours.  Insomnia secondary to situational depression She declines SSRI or wellbutrin; continue trazodone which is helpful  good sleep hygiene discussed, increase day time activity, try melatonin or benadryl -     traZODone (DESYREL) 50 MG tablet; 1-1.5 tab nightly for sleep and mood  B12 deficiency Check levels and supplement as indicated  ***  No orders of the defined types were placed in this encounter.    Over 40 minutes of exam, counseling, chart review and critical decision making was performed Future  Appointments  Date Time Provider [place]  09/05/2020 11:00 AM Natasha Comber, NP [facility] None  02/27/2021  2:00 PM Natasha Comber, NP [facility] None  09/05/2021 11:00 AM Natasha Comber, NP [facility] None     Plan:   During the course of the visit the patient was educated and counseled about appropriate screening and preventive services including:    Pneumococcal vaccine   Prevnar 13  Influenza vaccine  Td vaccine  Screening electrocardiogram  Bone densitometry screening  Colorectal cancer screening  Diabetes screening  Glaucoma screening  Nutrition counseling   Advanced directives: requested   Subjective:  [name] is a 70 y.o. female who presents for CPE. She has Hyperlipidemia; RENAL CALCULUS; External hemorrhoids with complication; Personal history of benign carcinoid tumor; Atherosclerosis of aorta ([name]); HSV-1 infection; Insomnia secondary to situational depression; Vitamin D deficiency; B12 deficiency; and Smoker on their problem list.  [name], [name] passed away in Jun 17, 2019. She is tearful, spending more time with daughter and grandson a lot. She reports she is working through this. Has been doing grief counseling with minister. She is prescribed trazodone, taking 50 mg.   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 She takes colace, no constipation/diarrhea. She follows with Dr. Ammie Miller.   she currently continues to smoke 1 pack a week; intermittently since 1979, never more than 0.25 pack/day, discussed risks associated with smoking, patient is ready to quit and is cutting down gradually. She has a <5 pack year history. Denies dyspnea, wheezing, cough, nights sweats,  fatigue.      She does not  check BP at home; today their BP is    She does workout, she walks with friend 3-4 days a week She denies chest pain, shortness of breath, dizziness.   BMI is There is no height or weight on file to calculate BMI., she is working on diet and exercise. Eats a lot of chicken, will have veggie, some beans, some fruit 1 sweet tea a day, and 2-3 bottles of water, 2 cups of coffee, black  No alcohol  Wt Readings from Last 3 Encounters:  02/23/20 110 lb 9.6 oz (50.2 kg)  09/08/19 115 lb (52.2 kg)  03/31/18 112 lb (50.8 kg)   She has abdominal aortic atherosclerosis per CT 2014  She is not on cholesterol medication, has declined statin or other med despite counseling and discussion of risks and benefits. She is eating salads, vegetables, baked proteins, using olive oil. She reports has been taking red yeast rice/herbal. Family hx *** Coronary calcium?   Her cholesterol is not at goal. The cholesterol last visit was:   Lab Results  Component Value Date   CHOL 305 (H) 02/23/2020   HDL 82 02/23/2020   LDLCALC 194 (H) 02/23/2020   LDLDIRECT 166.3 03/05/2011   TRIG 140 02/23/2020   CHOLHDL 3.7 02/23/2020    Last A1C in the office was:   Lab Results  Component Value Date   HGBA1C 5.2 02/23/2020   Last GFR: Lab Results  Component Value Date   GFRNONAA 82 02/23/2020   Patient is not on Vitamin D supplement but willing to start;    Lab Results  Component Value Date   VD25OH 26 (L) 02/23/2020       *** Lab Results  Component Value Date   VITAMINB12 317 02/23/2020    Medication Review: Current Outpatient Medications on File Prior to Visit  Medication Sig Dispense Refill  . traZODone (DESYREL) 50 MG tablet TAKE 1- 1.5 TABLET BY MOUTH DAILY FOR SLEEP 135 tablet 1   No current facility-administered medications on file prior to visit.    No Known Allergies  Current Problems (verified) Patient Active Problem List   Diagnosis Date Noted  . B12 deficiency 02/22/2020  . Smoker  02/22/2020  . 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  . Insomnia secondary to situational depression 09/03/2018  . Vitamin D deficiency 09/03/2018  . Atherosclerosis of aorta ([place]) 03/31/2018  . Personal history of benign carcinoid tumor 03/19/2017  . External hemorrhoids with complication 13/24/4010  . RENAL CALCULUS 08/16/2007  . Hyperlipidemia 08/09/2007    Screening Tests Immunization History  Administered Date(s) Administered  . Influenza, High Dose Seasonal PF 02/11/2018, 02/23/2020  . Influenza-Unspecified 02/17/2019  . PFIZER(Purple Top)SARS-COV-2 Vaccination 07/12/2019, 08/02/2019  . Pneumococcal Conjugate-13 04/30/2015  . Pneumococcal Polysaccharide-23 03/31/2018  . Td 08/02/1996  . Tdap 03/10/2011   *** Tetanus:  2012 Prevnar 13:  2016 Pneumonia: 2019 Influenza: 02/2019 Shingles: interested in shingrix Covid 19: 2/2, 2021  Pap: 2016 neg HPV normal PAP declines another MGM: 02/14/2020 DEXA: ordered and pending, couldn't get with mammogram this year  Colonoscopy: 2012  CXR 05/2016, had today  CT abd: 2014, abdominal aortic atherosclerosis   Names of Other Physician/Practitioners you currently use: 1. [facility] here for primary care 2. Last Eye exam:  [facility] specialist, 2021 3. Last dental exam: upper dentures, partial on bottom  Patient Care Team: Unk Pinto, MD as PCP - General (Internal Medicine)  SURGICAL HISTORY She  has a past surgical history that includes Vaginal hysterectomy; Knee surgery (07/2006); Breast cyst excision; Colonoscopy (nov 2011); laparoscopy (N/A, 12/10/2016); and laparotomy (N/A, 12/10/2016). FAMILY HISTORY Her family history includes Alzheimer's disease (age of onset: 49) in her mother; Atrial fibrillation in her mother; Heart disease in her father; Hypertension in her father; Leukemia in her maternal grandmother, paternal grandfather, and paternal uncle; Multiple sclerosis in her sister; Thyroid  disease in her mother.   SOCIAL HISTORY She  reports that she has been smoking cigarettes. She started smoking about 43 years ago. She has a 2.40 pack-year smoking history. She has never used smokeless tobacco. She reports that she does not drink alcohol and does not use drugs.   MEDICARE WELLNESS OBJECTIVES: Physical activity:   Cardiac risk factors:   Depression/mood screen:   Depression screen [facility] 2/9 02/23/2020  Decreased Interest 0  Down, Depressed, Hopeless 1  PHQ - 2 Score 1  Altered sleeping -  Tired, decreased energy -  Change in appetite -  Feeling bad or failure about yourself  -  Trouble concentrating -  Moving slowly or fidgety/restless -  Suicidal thoughts -  PHQ-9 Score -  Difficult doing work/chores -    ADLs:  In your present state of health, do you have any difficulty performing the following activities: 09/08/2019  Hearing? N  Comment has bil hearing aids  Vision? N  Difficulty concentrating or making decisions? N  Walking or climbing stairs? N  Dressing or bathing? N  Doing errands, shopping? N  Some recent data might be hidden     Cognitive Testing  Alert? Yes  Normal Appearance?Yes  Oriented to person? Yes  Place? Yes   Time? Yes  Recall of three objects?  Yes  Can perform simple calculations? Yes  Displays appropriate judgment?Yes  Can read the correct time from a watch face?Yes  EOL planning:      Review of Systems  Constitutional: Negative.  Negative for malaise/fatigue and weight loss.  HENT: Negative.  Negative for hearing loss and tinnitus.   Eyes: Negative.  Negative for blurred vision and double vision.  Respiratory: Negative.  Negative for cough, shortness of breath and wheezing.   Cardiovascular: Negative.  Negative for chest pain, palpitations, orthopnea, claudication and leg swelling.  Gastrointestinal: Negative.  Negative for abdominal pain, blood in stool, constipation, diarrhea, heartburn, melena, nausea and vomiting.   Genitourinary: Negative.   Musculoskeletal: Negative.  Negative for joint pain and myalgias.  Skin: Negative.  Negative for rash.  Neurological: Negative.  Negative for dizziness, tingling, sensory change, weakness and headaches.  Endo/Heme/Allergies: Negative.  Negative for polydipsia.  Psychiatric/Behavioral: Negative for depression, substance abuse and suicidal ideas. The patient has insomnia. The patient is not nervous/anxious.   All other systems reviewed and are negative.    Objective:     There were no vitals filed for this visit. There is no height or weight on file to calculate BMI.  General appearance: alert, no distress, WD/WN, female HEENT: normocephalic, sclerae anicteric, TMs pearly, nares patent, no discharge or erythema, pharynx normal Oral cavity: MMM, no lesions Neck: supple, no lymphadenopathy, no thyromegaly, no masses Heart: RRR, normal S1, S2, no murmurs Lungs: CTA bilaterally, no wheezes, rhonchi, or rales Abdomen: +bs, soft, non tender, non distended, no masses, no hepatomegaly, no splenomegaly Musculoskeletal: nontender, no swelling, no obvious deformity Extremities: no edema, no cyanosis, no clubbing Pulses: 2+ symmetric, upper and lower extremities, normal cap refill Neurological: alert, oriented x 3, CN2-12 intact, strength normal  upper extremities and lower extremities, sensation normal throughout, DTRs 2+ throughout, no cerebellar signs, gait normal Psychiatric: normal affect, intermittently tearful, behavior normal, pleasant    Medicare Attestation I have personally reviewed: The patient's medical and social history Their use of alcohol, tobacco or illicit drugs Their current medications and supplements The patient's functional ability including ADLs,fall risks, home safety risks, cognitive, and hearing and visual impairment Diet and physical activities Evidence for depression or mood disorders  The patient's weight, height, BMI, and visual acuity  have been recorded in the chart.  I have made referrals, counseling, and provided education to the patient based on review of the above and I have provided the patient with a written personalized care plan for preventive services.      Izora Ribas, NP   09/03/2020

## 2020-09-05 ENCOUNTER — Ambulatory Visit: Payer: PPO | Admitting: Adult Health

## 2020-09-05 DIAGNOSIS — F4321 Adjustment disorder with depressed mood: Secondary | ICD-10-CM

## 2020-09-05 DIAGNOSIS — Z0000 Encounter for general adult medical examination without abnormal findings: Secondary | ICD-10-CM

## 2020-09-05 DIAGNOSIS — Z86012 Personal history of benign carcinoid tumor: Secondary | ICD-10-CM

## 2020-09-05 DIAGNOSIS — Z6822 Body mass index (BMI) 22.0-22.9, adult: Secondary | ICD-10-CM

## 2020-09-05 DIAGNOSIS — N200 Calculus of kidney: Secondary | ICD-10-CM

## 2020-09-05 DIAGNOSIS — E559 Vitamin D deficiency, unspecified: Secondary | ICD-10-CM

## 2020-09-05 DIAGNOSIS — E785 Hyperlipidemia, unspecified: Secondary | ICD-10-CM

## 2020-09-05 DIAGNOSIS — B009 Herpesviral infection, unspecified: Secondary | ICD-10-CM

## 2020-09-05 DIAGNOSIS — E538 Deficiency of other specified B group vitamins: Secondary | ICD-10-CM

## 2020-09-05 DIAGNOSIS — F17200 Nicotine dependence, unspecified, uncomplicated: Secondary | ICD-10-CM

## 2020-09-05 DIAGNOSIS — I700 Atherosclerosis of aorta: Secondary | ICD-10-CM

## 2020-09-05 DIAGNOSIS — Z79899 Other long term (current) drug therapy: Secondary | ICD-10-CM

## 2020-10-27 ENCOUNTER — Other Ambulatory Visit: Payer: Self-pay | Admitting: Adult Health

## 2020-10-27 DIAGNOSIS — F4321 Adjustment disorder with depressed mood: Secondary | ICD-10-CM

## 2020-12-18 DIAGNOSIS — H66009 Acute suppurative otitis media without spontaneous rupture of ear drum, unspecified ear: Secondary | ICD-10-CM | POA: Diagnosis not present

## 2021-02-18 DIAGNOSIS — H8110 Benign paroxysmal vertigo, unspecified ear: Secondary | ICD-10-CM | POA: Diagnosis not present

## 2021-02-18 DIAGNOSIS — H6992 Unspecified Eustachian tube disorder, left ear: Secondary | ICD-10-CM | POA: Diagnosis not present

## 2021-02-18 DIAGNOSIS — H6991 Unspecified Eustachian tube disorder, right ear: Secondary | ICD-10-CM | POA: Diagnosis not present

## 2021-02-21 NOTE — Progress Notes (Signed)
FOLLOW UP  Assessment and Plan:   [name] was seen today for dizziness.  Diagnoses and all orders for this visit:  Hyperlipidemia, unspecified hyperlipidemia type -     COMPLETE METABOLIC PANEL WITH GFR -     Lipid panel  Atherosclerosis of aorta ([facility])  Control blood pressures, weight and cholesterol  Vitamin D deficiency  Continue Vit D supplementation  Dizziness -     CBC with Differential/Platelet -     TSH -     meclizine (ANTIVERT) 25 MG tablet; Take 1 tablet (25 mg total) by mouth 3 (three) times daily as needed for dizziness.  Infection of right inner ear -     dexamethasone (DECADRON) 0.5 MG tablet; Take 1 tab 3 x day - 3 days, then 2 x day - 3 days, then 1 tab daily -     azithromycin (ZITHROMAX) 250 MG tablet; Take 2 tablets (500 mg) on  Day 1,  followed by 1 tablet (250 mg) once daily on Days 2 through 5.  Trapezius muscle spasm   Trigger point injection of right trapezius with immediate relief of symptoms, continue heat as needed at home and muscle balm   Continue diet and meds as discussed. Further disposition pending results of labs. Discussed med's effects and SE's.   Over 30 minutes of exam, counseling, chart review, and critical decision making was performed.   No future appointments.  ----------------------------------------------------------------------------------------------------------------------  HPI 70 y.o. female  presents for 3 month follow up on hyperlipidemia, Vit D deficiency  PT was seen at Urgent Care on Monday for dizziness 1 month ago and they gave her steroid injections and it improved.  Then 4 hdays ago symptoms returned and she went to Urgent Care again and was told she had fluid in eras and dizziness- diagnosed with Vertigo and was given steroid shot and Meclizine.  Symptoms not improved. Dizziness worse when she lays flat and rolls to the right.  If sits perfectly still it improves.    Has also been having bad neck pain in right  side of neck and upper arm. Constant aching pain. Started yesterday after sleeping.  BMI is Body mass index is 23.13 kg/m., she has been working on diet and exercise. Wt Readings from Last 3 Encounters:  02/22/21 112 lb 9.6 oz (51.1 kg)  02/23/20 110 lb 9.6 oz (50.2 kg)  09/08/19 115 lb (52.2 kg)    Her blood pressure has been controlled at home, today their BP is BP: 130/80 BP Readings from Last 3 Encounters:  02/22/21 130/80  02/23/20 116/64  09/08/19 128/86     She does workout. She denies chest pain, shortness of breath, dizziness.   She is not on cholesterol medication  Her cholesterol is not at goal. The cholesterol last visit was:   Lab Results  Component Value Date   CHOL 305 (H) 02/23/2020   HDL 82 02/23/2020   LDLCALC 194 (H) 02/23/2020   LDLDIRECT 166.3 03/05/2011   TRIG 140 02/23/2020   CHOLHDL 3.7 02/23/2020    Patient is on Vitamin D supplement.   Lab Results  Component Value Date   VD25OH 26 (L) 02/23/2020        Current Medications:  Current Outpatient Medications on File Prior to Visit  Medication Sig   traZODone (DESYREL) 50 MG tablet TAKE 1 TO 1 AND 1/2 TABLETS BY MOUTH DAILY FOR SLEEP   No current facility-administered medications on file prior to visit.     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  HENT:  Negative for congestion and hearing loss.   Eyes:  Negative for blurred vision and double vision.  Respiratory:  Negative for cough and shortness of breath.   Cardiovascular:  Negative for chest pain, palpitations, orthopnea and leg  swelling.  Gastrointestinal:  Negative for abdominal pain, constipation, diarrhea, heartburn, nausea and vomiting.  Musculoskeletal:  Positive for neck pain. Negative for falls, joint pain and myalgias.  Skin:  Negative for rash.  Neurological:  Positive for dizziness. Negative for tingling, tremors, loss of consciousness and headaches.  Psychiatric/Behavioral:  Negative for depression, memory loss and suicidal ideas.      Physical Exam: BP 130/80   Pulse 90   Temp 97.7 F (36.5 C)   Wt 112 lb 9.6 oz (51.1 kg)   SpO2 96%   BMI 23.13 kg/m  Wt Readings from Last 3 Encounters:  02/22/21 112 lb 9.6 oz (51.1 kg)  02/23/20 110 lb 9.6 oz (50.2 kg)  09/08/19 115 lb (52.2 kg)   General Appearance: Well nourished, in no apparent distress. Eyes: PERRLA, EOMs, conjunctiva no swelling or erythema Sinuses: No Frontal/maxillary tenderness ENT/Mouth: Ext aud canals clear, Right TM erythema noted and some fluid and bulging. No erythema, swelling, or exudate on post pharynx.  Tonsils not swollen or erythematous. Hearing normal.  Neck: Supple, thyroid normal.  Respiratory: Respiratory effort normal, BS equal bilaterally without rales, rhonchi, wheezing or stridor.  Cardio: RRR with no MRGs. Brisk peripheral pulses without edema.  Abdomen: Soft, + BS.  Non tender, no guarding, rebound, hernias, masses. Lymphatics: Non tender without lymphadenopathy.  Musculoskeletal: Full ROM, 5/5 strength, Normal gait. Spasm of right trapezius Skin: Warm, dry without rashes, lesions, ecchymosis.  Neuro: Cranial nerves intact. No cerebellar symptoms.  Psych: Awake and oriented X 3, normal affect, Insight and Judgment appropriate.   A trigger point injection was performed at the site of maximal tenderness on right trapezius using 1% plain Lidocaine and dexamethasone. This was well tolerated, and followed by instant relief of pain.    Natasha Bernheim, NP 9:52 AM [name]

## 2021-02-22 ENCOUNTER — Other Ambulatory Visit: Payer: Self-pay

## 2021-02-22 ENCOUNTER — Ambulatory Visit (INDEPENDENT_AMBULATORY_CARE_PROVIDER_SITE_OTHER): Payer: PPO | Admitting: Nurse Practitioner

## 2021-02-22 ENCOUNTER — Encounter: Payer: Self-pay | Admitting: Nurse Practitioner

## 2021-02-22 VITALS — BP 130/80 | HR 90 | Temp 97.7°F | Wt 112.6 lb

## 2021-02-22 DIAGNOSIS — R42 Dizziness and giddiness: Secondary | ICD-10-CM

## 2021-02-22 DIAGNOSIS — H8301 Labyrinthitis, right ear: Secondary | ICD-10-CM | POA: Diagnosis not present

## 2021-02-22 DIAGNOSIS — E559 Vitamin D deficiency, unspecified: Secondary | ICD-10-CM | POA: Diagnosis not present

## 2021-02-22 DIAGNOSIS — I700 Atherosclerosis of aorta: Secondary | ICD-10-CM

## 2021-02-22 DIAGNOSIS — E785 Hyperlipidemia, unspecified: Secondary | ICD-10-CM

## 2021-02-22 DIAGNOSIS — M62838 Other muscle spasm: Secondary | ICD-10-CM

## 2021-02-22 MED ORDER — MECLIZINE HCL 25 MG PO TABS: 25.0000 mg | ORAL_TABLET | Freq: Three times a day (TID) | ORAL | 0 refills | Status: DC | PRN

## 2021-02-22 MED ORDER — AZITHROMYCIN 250 MG PO TABS: ORAL_TABLET | ORAL | 1 refills | Status: DC

## 2021-02-22 MED ORDER — DEXAMETHASONE 0.5 MG PO TABS: ORAL_TABLET | ORAL | 0 refills | Status: DC

## 2021-02-22 NOTE — Patient Instructions (Signed)
Use Mucinex 2-3 times a day for the next 3-5 days   Muscle Cramps and Spasms Muscle cramps and spasms occur when a muscle or muscles tighten and you have no control over this tightening (involuntary muscle contraction). They are a common problem and can develop in any muscle. The most common place is in the calf muscles of the leg. Muscle cramps and muscle spasms are both involuntary muscle contractions, but there are some differences between the two: Muscle cramps are painful. They come and go and may last for a few seconds or up to 15 minutes. Muscle cramps are often more forceful and last longer than muscle spasms. Muscle spasms may or may not be painful. They may also last just a few seconds or much longer. Certain medical conditions, such as diabetes or Parkinson's disease, can make it more likely to develop cramps or spasms. However, cramps or spasms are usually not caused by a serious underlying problem. Common causes include: Doing more physical work or exercise than your body is ready for (overexertion). Overuse from repeating certain movements too many times. Remaining in a certain position for a long period of time. Improper preparation, form, or technique while playing a sport or doing an activity. Dehydration. Injury. Side effects of some medicines. Abnormally low levels of the salts and minerals in your blood (electrolytes), especially potassium and calcium. This could happen if you are taking water pills (diuretics) or if you are pregnant. In many cases, the cause of muscle cramps or spasms is not known. Follow these instructions at home: Managing pain and stiffness   Try massaging, stretching, and relaxing the affected muscle. Do this for several minutes at a time. If directed, apply heat to tight or tense muscles as often as told by your health care provider. Use the heat source that your health care provider recommends, such as a moist heat pack or a heating pad. Place a towel  between your skin and the heat source. Leave the heat on for 20-30 minutes. Remove the heat if your skin turns bright red. This is especially important if you are unable to feel pain, heat, or cold. You may have a greater risk of getting burned. If directed, put ice on the affected area. This may help if you are sore or have pain after a cramp or spasm. Put ice in a plastic bag. Place a towel between your skin and the bag. Leave the ice on for 20 minutes, 2-3 times a day. Try taking hot showers or baths to help relax tight muscles. Eating and drinking Drink enough fluid to keep your urine pale yellow. Staying well hydrated may help prevent cramps or spasms. Eat a healthy diet that includes plenty of nutrients to help your muscles function. A healthy diet includes fruits and vegetables, lean protein, whole grains, and low-fat or nonfat dairy products. General instructions If you are having frequent cramps, avoid intense exercise for several days. Take over-the-counter and prescription medicines only as told by your health care provider. Pay attention to any changes in your symptoms. Keep all follow-up visits as told by your health care provider. This is important. Contact a health care provider if: Your cramps or spasms get more severe or happen more often. Your cramps or spasms do not improve over time. Summary Muscle cramps and spasms occur when a muscle or muscles tighten and you have no control over this tightening (involuntary muscle contraction). The most common place for cramps or spasms to occur is in the  calf muscles of the leg. Massaging, stretching, and relaxing the affected muscle may relieve the cramp or spasm. Drink enough fluid to keep your urine pale yellow. Staying well hydrated may help prevent cramps or spasms. This information is not intended to replace advice given to you by your health care provider. Make sure you discuss any questions you have with your health care  provider. Document Revised: 09/28/2017 Document Reviewed: 09/28/2017 Elsevier Patient Education  [place].

## 2021-02-23 ENCOUNTER — Other Ambulatory Visit: Payer: Self-pay | Admitting: Nurse Practitioner

## 2021-02-23 DIAGNOSIS — E785 Hyperlipidemia, unspecified: Secondary | ICD-10-CM

## 2021-02-23 DIAGNOSIS — E039 Hypothyroidism, unspecified: Secondary | ICD-10-CM

## 2021-02-23 LAB — COMPLETE METABOLIC PANEL WITH GFR
AG Ratio: 2.1 (calc) (ref 1.0–2.5)
ALT: 9 U/L (ref 6–29)
AST: 13 U/L (ref 10–35)
Albumin: 4.4 g/dL (ref 3.6–5.1)
Alkaline phosphatase (APISO): 71 U/L (ref 37–153)
BUN: 15 mg/dL (ref 7–25)
CO2: 27 mmol/L (ref 20–32)
Calcium: 9.3 mg/dL (ref 8.6–10.4)
Chloride: 107 mmol/L (ref 98–110)
Creat: 0.58 mg/dL (ref 0.50–1.05)
Globulin: 2.1 g/dL (calc) (ref 1.9–3.7)
Glucose, Bld: 82 mg/dL (ref 65–99)
Potassium: 4.2 mmol/L (ref 3.5–5.3)
Sodium: 142 mmol/L (ref 135–146)
Total Bilirubin: 0.4 mg/dL (ref 0.2–1.2)
Total Protein: 6.5 g/dL (ref 6.1–8.1)
eGFR: 99 mL/min/{1.73_m2} (ref >=60)

## 2021-02-23 LAB — CBC WITH DIFFERENTIAL/PLATELET
Absolute Monocytes: 568 cells/uL (ref 200–950)
Basophils Absolute: 57 cells/uL (ref 0–200)
Basophils Relative: 0.8 %
Eosinophils Absolute: 57 cells/uL (ref 15–500)
Eosinophils Relative: 0.8 %
HCT: 43 % (ref 35.0–45.0)
Hemoglobin: 14.5 g/dL (ref 11.7–15.5)
Lymphs Abs: 1271 cells/uL (ref 850–3900)
MCH: 31.3 pg (ref 27.0–33.0)
MCHC: 33.7 g/dL (ref 32.0–36.0)
MCV: 92.9 fL (ref 80.0–100.0)
MPV: 10.3 fL (ref 7.5–12.5)
Monocytes Relative: 8 %
Neutro Abs: 5148 cells/uL (ref 1500–7800)
Neutrophils Relative %: 72.5 %
Platelets: 302 10*3/uL (ref 140–400)
RBC: 4.63 10*6/uL (ref 3.80–5.10)
RDW: 11.8 % (ref 11.0–15.0)
Total Lymphocyte: 17.9 %
WBC: 7.1 10*3/uL (ref 3.8–10.8)

## 2021-02-23 LAB — LIPID PANEL
Cholesterol: 267 mg/dL — ABNORMAL HIGH (ref <200)
HDL: 73 mg/dL (ref >=50)
LDL Cholesterol (Calc): 170 mg/dL (calc) — ABNORMAL HIGH
Non-HDL Cholesterol (Calc): 194 mg/dL (calc) — ABNORMAL HIGH (ref <130)
Total CHOL/HDL Ratio: 3.7 (calc) (ref <5.0)
Triglycerides: 117 mg/dL (ref <150)

## 2021-02-23 LAB — TSH: TSH: 5.98 mIU/L — ABNORMAL HIGH (ref 0.40–4.50)

## 2021-02-23 MED ORDER — ROSUVASTATIN CALCIUM 5 MG PO TABS: 5.0000 mg | ORAL_TABLET | Freq: Every day | ORAL | 3 refills | Status: DC

## 2021-02-23 MED ORDER — LEVOTHYROXINE SODIUM 25 MCG PO TABS: 25.0000 ug | ORAL_TABLET | Freq: Every day | ORAL | 2 refills | Status: DC

## 2021-02-25 ENCOUNTER — Other Ambulatory Visit: Payer: Self-pay | Admitting: Nurse Practitioner

## 2021-02-25 ENCOUNTER — Telehealth: Payer: Self-pay

## 2021-02-25 DIAGNOSIS — M62838 Other muscle spasm: Secondary | ICD-10-CM

## 2021-02-25 MED ORDER — CYCLOBENZAPRINE HCL 5 MG PO TABS: 5.0000 mg | ORAL_TABLET | Freq: Three times a day (TID) | ORAL | 0 refills | Status: DC | PRN

## 2021-02-25 NOTE — Telephone Encounter (Signed)
No Crestor will not help neck pain. Can send in a muscle relaxant but can not take with the meclizine because they can both cause drowsiness

## 2021-02-25 NOTE — Telephone Encounter (Signed)
Flexeril has been sent in.  Advise pt not to take Meclizine with Flexeril.

## 2021-02-25 NOTE — Telephone Encounter (Signed)
Patient said her neck and upper right arm have been hurting bad all weekend. She is still dizzy but not as bad as she was.  The only med she was not able to pick up was the Crestor.  She asked if that would help with her neck pain?

## 2021-02-26 ENCOUNTER — Other Ambulatory Visit: Payer: Self-pay

## 2021-02-26 ENCOUNTER — Other Ambulatory Visit: Payer: Self-pay | Admitting: Nurse Practitioner

## 2021-02-26 ENCOUNTER — Ambulatory Visit
Admission: RE | Admit: 2021-02-26 | Discharge: 2021-02-26 | Disposition: A | Discharge status: Home / Self Care | Payer: PRIVATE HEALTH INSURANCE | Source: Ambulatory Visit | Attending: Nurse Practitioner | Admitting: Nurse Practitioner

## 2021-02-26 ENCOUNTER — Telehealth: Payer: Self-pay

## 2021-02-26 DIAGNOSIS — M25511 Pain in right shoulder: Secondary | ICD-10-CM

## 2021-02-26 DIAGNOSIS — R531 Weakness: Secondary | ICD-10-CM | POA: Diagnosis not present

## 2021-02-26 NOTE — Telephone Encounter (Signed)
Patient called and said she is still having trouble lifting her arm up even with the muscle relaxer.

## 2021-02-26 NOTE — Telephone Encounter (Signed)
I do wonder if she possibly has torn a ligament in her arm.  I would like to start with an xray. I can put in an order for xray of right shoulder to [facility] on [address].  Please let me know if she would like to proceed.

## 2021-02-27 ENCOUNTER — Encounter: Payer: PPO | Admitting: Adult Health

## 2021-02-28 ENCOUNTER — Other Ambulatory Visit: Payer: Self-pay | Admitting: Nurse Practitioner

## 2021-02-28 DIAGNOSIS — H9201 Otalgia, right ear: Secondary | ICD-10-CM

## 2021-02-28 DIAGNOSIS — M25511 Pain in right shoulder: Secondary | ICD-10-CM

## 2021-02-28 DIAGNOSIS — R42 Dizziness and giddiness: Secondary | ICD-10-CM

## 2021-03-04 ENCOUNTER — Telehealth: Payer: Self-pay | Admitting: Nurse Practitioner

## 2021-03-04 NOTE — Telephone Encounter (Signed)
Patient called to report constipation for 4 days.  She states has been taking stool soften daily with no relief, only a small hard stool.  She is concerned because she had gi surgery  3 yrs ago and it was recommended not to get compacted. Patient would like recommendation for an alternative otc medication for constipation.  Notes neck pain still present.  Also, She is scheduled for MRI, 03/16/21. She asks if she should  keep follow up w/ you on 03/08/21 or reschedule for after MRI?

## 2021-03-06 ENCOUNTER — Telehealth: Payer: Self-pay | Admitting: Nurse Practitioner

## 2021-03-06 NOTE — Progress Notes (Deleted)
MEDICARE ANNUAL WELLNESS VISIT  Assessment:        Over 40 minutes of exam, counseling, chart review and critical decision making was performed Future Appointments  Date Time Provider [place]  03/08/2021 11:00 AM Magda Bernheim, NP [facility] None  03/16/2021  4:40 PM GI-315 MR 3 GI-315MRI GI-315 [address]     Plan:   During the course of the visit the patient was educated and counseled about appropriate screening and preventive services including:   Pneumococcal vaccine  Prevnar 13 Influenza vaccine Td vaccine Screening electrocardiogram Bone densitometry screening Colorectal cancer screening Diabetes screening Glaucoma screening Nutrition counseling  Advanced directives: requested   Subjective:  [name] is a 70 y.o. female who presents for Medicare Annual Wellness Visit.  [name], [name] passed away in 07/02/22. She is tearful, spending more time with daughter and grandson a lot. She is tearful but reports she is working through this, feels she is improving. Has been doing grief counseling with minister. ***  She is retired from [employer] u[employer] and she was enjoying it.   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 She takes colace, no constipation/diarrhea. She follows with Dr. Ammie Dalton.   she currently continues to smoke 1 pack a week; intermittently since 1979, never more than 0.25 pack/day, discussed risks associated with smoking, patient is ready to quit and is cutting down gradually. She has a 30+ pack year history, last screening was negative CXR in 07-02-16     She has abdominal aortic atherosclerosis per CT 2014 She does not check BP at home; today their BP is    She does workout, she walks with friend 3-4 days a week She denies chest pain, shortness of breath,  dizziness.   BMI is There is no height or weight on file to calculate BMI., she is working on diet and exercise. Wt Readings from Last 3 Encounters:  02/22/21 112 lb 9.6 oz (51.1 kg)  02/23/20 110 lb 9.6 oz (50.2 kg)  09/08/19 115 lb (52.2 kg)   She is not on cholesterol medication, has declined statin despite counseling and discussion of risks and benefits. She is eating salads, vegetables, baked proteins, using olive oil.  Her cholesterol is not at goal. The cholesterol last visit was:   Lab Results  Component Value Date   CHOL 267 (H) 02/22/2021   HDL 73 02/22/2021   LDLCALC 170 (H) 02/22/2021   LDLDIRECT 166.3 03/05/2011   TRIG 117 02/22/2021   CHOLHDL 3.7 02/22/2021    Last A1C in the office was:   Lab Results  Component Value Date   HGBA1C 5.2 02/23/2020   Last GFR: Lab Results  Component Value Date   GFRNONAA 82 02/23/2020   Patient is not on Vitamin D supplement but willing to start;    Lab Results  Component Value Date   VD25OH 26 (L) 02/23/2020       Medication Review: Current Outpatient Medications on File Prior to Visit  Medication Sig Dispense Refill   azithromycin (ZITHROMAX) 250 MG tablet Take 2 tablets (500 mg) on  Day 1,  followed by 1 tablet (250 mg) once daily on Days 2 through 5. 6 each 1   cyclobenzaprine (FLEXERIL) 5 MG tablet Take 1 tablet (5 mg total) by mouth 3 (three) times daily as needed for muscle spasms. 60 tablet  0   dexamethasone (DECADRON) 0.5 MG tablet Take 1 tab 3 x day - 3 days, then 2 x day - 3 days, then 1 tab daily 20 tablet 0   docusate sodium (COLACE) 100 MG capsule Take 100 mg by mouth 2 (two) times daily.     levothyroxine (SYNTHROID) 25 MCG tablet Take 1 tablet (25 mcg total) by mouth daily before breakfast. 30 tablet 2   meclizine (ANTIVERT) 25 MG tablet Take 1 tablet (25 mg total) by mouth 3 (three) times daily as needed for dizziness. 30 tablet 0   naproxen sodium (ALEVE) 220 MG tablet Take 220 mg by mouth.     rosuvastatin  (CRESTOR) 5 MG tablet Take 1 tablet (5 mg total) by mouth daily. 30 tablet 3   traZODone (DESYREL) 50 MG tablet TAKE 1 TO 1 AND 1/2 TABLETS BY MOUTH DAILY FOR SLEEP (Patient not taking: Reported on 02/22/2021) 135 tablet 1   No current facility-administered medications on file prior to visit.    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   Vitamin D deficiency 09/03/2018   Atherosclerosis of aorta ([place]) 03/31/2018   Personal history of benign carcinoid tumor 03/19/2017   External hemorrhoids with complication 25/36/6440   RENAL CALCULUS 08/16/2007   Hyperlipidemia 08/09/2007    Screening Tests Immunization History  Administered Date(s) Administered   Influenza, High Dose Seasonal PF 02/11/2018, 02/23/2020   Influenza-Unspecified 02/17/2019   PFIZER Comirnaty(Gray Top)Covid-19 Tri-Sucrose Vaccine 03/15/2020   PFIZER(Purple Top)SARS-COV-2 Vaccination 07/12/2019, 08/02/2019   Pfizer Covid-19 Vaccine Bivalent Booster 72yrs & up 01/29/2021   Pneumococcal Conjugate-13 04/30/2015   Pneumococcal Polysaccharide-23 03/31/2018   Td 08/02/1996   Tdap 03/10/2011   Tetanus:  2012 *** Prevnar 13:  2016 Pneumonia: 2019 Influenza: 02/23/20 Shingles: interested in shingrix Covid 19: 2/2, 2021  Pap: 2016 neg HPV normal PAP declines another MGM: 02/14/20 negative [facility] DEXA: ordered, schedule with mammogram   Colonoscopy: 2012 *** CXR 05/2016  CT abd: 2014, abdominal aortic atherosclerosis   Names of Other Physician/Practitioners you currently use: 1. [facility] here for primary care 2. Last Eye exam:  [facility] specialist,*** 3. Last dental exam: upper dentures, partial on bottom  Patient Care Team: Unk Pinto, MD as PCP  - General (Internal Medicine)  SURGICAL HISTORY She  has a past surgical history that includes Vaginal hysterectomy; Knee surgery (07/2006); Breast cyst excision; Colonoscopy (nov 2011); laparoscopy (N/A, 12/10/2016); and laparotomy (N/A, 12/10/2016). FAMILY HISTORY Her family history includes Alzheimer's disease (age of onset: 68) in her mother; Atrial fibrillation in her mother; Heart disease in her father; Hypertension in her father; Leukemia in her maternal grandmother, paternal grandfather, and paternal uncle; Multiple sclerosis in her sister; Thyroid disease in her mother.   SOCIAL HISTORY She  reports that she has been smoking cigarettes. She started smoking about 43 years ago. She has a 2.40 pack-year smoking history. She has never used smokeless tobacco. She reports that she does not drink alcohol and does not use drugs.    MEDICARE WELLNESS OBJECTIVES: Physical activity:   Cardiac risk factors:   Depression/mood screen:   Depression screen [facility] 2/9 02/23/2020  Decreased Interest 0  Down, Depressed, Hopeless 1  PHQ - 2 Score 1  Altered sleeping -  Tired, decreased energy -  Change in appetite -  Feeling bad or failure about yourself  -  Trouble concentrating -  Moving slowly or fidgety/restless -  Suicidal thoughts -  PHQ-9 Score -  Difficult doing work/chores -    ADLs:  No flowsheet data found.    Cognitive Testing  Alert? Yes  Normal Appearance?Yes  Oriented to person? Yes  Place? Yes   Time? Yes  Recall of three objects?  Yes  Can perform simple calculations? Yes  Displays appropriate judgment?Yes  Can read the correct time from a watch face?Yes  EOL planning:    Review of Systems  Constitutional: Negative.  Negative for chills, fever and weight loss.  HENT: Negative.  Negative for congestion and hearing loss.   Eyes: Negative.  Negative for blurred vision and double vision.  Respiratory: Negative.  Negative for cough and shortness of breath.    Cardiovascular: Negative.  Negative for chest pain, palpitations, orthopnea and leg swelling.  Gastrointestinal: Negative.  Negative for abdominal pain, constipation, diarrhea, heartburn, nausea and vomiting.  Genitourinary: Negative.   Musculoskeletal: Negative.  Negative for falls, joint pain and myalgias.  Skin: Negative.  Negative for rash.  Neurological: Negative.  Negative for dizziness, tingling, tremors, loss of consciousness and headaches.  Endo/Heme/Allergies: Negative.   Psychiatric/Behavioral:  Positive for depression. Negative for memory loss, substance abuse and suicidal ideas. The patient has insomnia. The patient is not nervous/anxious.     Objective:     There were no vitals filed for this visit.  There is no height or weight on file to calculate BMI.  General appearance: alert, no distress, WD/WN, female HEENT: normocephalic, sclerae anicteric, TMs pearly, nares patent, no discharge or erythema, pharynx normal Oral cavity: MMM, no lesions Neck: supple, no lymphadenopathy, no thyromegaly, no masses Heart: RRR, normal S1, S2, no murmurs Lungs: CTA bilaterally, no wheezes, rhonchi, or rales Abdomen: +bs, soft, non tender, non distended, no masses, no hepatomegaly, no splenomegaly Musculoskeletal: nontender, no swelling, no obvious deformity Extremities: no edema, no cyanosis, no clubbing Pulses: 2+ symmetric, upper and lower extremities, normal cap refill Neurological: alert, oriented x 3, CN2-12 intact, strength normal upper extremities and lower extremities, sensation normal throughout, DTRs 2+ throughout, no cerebellar signs, gait normal Psychiatric: normal affect, intermittently tearful, behavior normal, pleasant    Medicare Attestation I have personally reviewed: The patient's medical and social history Their use of alcohol, tobacco or illicit drugs Their current medications and supplements The patient's functional ability including ADLs,fall risks, home  safety risks, cognitive, and hearing and visual impairment Diet and physical activities Evidence for depression or mood disorders  The patient's weight, height, BMI, and visual acuity have been recorded in the chart.  I have made referrals, counseling, and provided education to the patient based on review of the above and I have provided the patient with a written personalized care plan for preventive services.     Magda Bernheim, NP   03/06/2021

## 2021-03-06 NOTE — Telephone Encounter (Signed)
Please advise pt to change appointment until after MRI.  Use Miralax or magnesium Citrate to help with constipation.

## 2021-03-06 NOTE — Telephone Encounter (Signed)
[name], Patient called to report constipation for 4 days.  She states has been taking stool soften daily with no relief, only a small hard stool.  She is concerned because she had gi surgery  3 yrs ago and it was recommended not to get compacted. Patient would like recommendation for an alternative otc medication for constipation.  Notes neck pain still present. Can not raise arm above head. Also, She is scheduled for MRI, 03/16/21. She asks if she should  keep follow up w/ you on 03/08/21 or reschedule for after MRI?

## 2021-03-08 ENCOUNTER — Ambulatory Visit: Payer: PPO | Admitting: Nurse Practitioner

## 2021-03-16 ENCOUNTER — Ambulatory Visit
Admission: RE | Admit: 2021-03-16 | Discharge: 2021-03-16 | Disposition: A | Discharge status: Home / Self Care | Payer: PRIVATE HEALTH INSURANCE | Source: Ambulatory Visit | Attending: Nurse Practitioner | Admitting: Nurse Practitioner

## 2021-03-16 DIAGNOSIS — M25511 Pain in right shoulder: Secondary | ICD-10-CM

## 2021-03-16 DIAGNOSIS — S46011A Strain of muscle(s) and tendon(s) of the rotator cuff of right shoulder, initial encounter: Secondary | ICD-10-CM | POA: Diagnosis not present

## 2021-03-16 DIAGNOSIS — M19011 Primary osteoarthritis, right shoulder: Secondary | ICD-10-CM | POA: Diagnosis not present

## 2021-03-19 ENCOUNTER — Other Ambulatory Visit: Payer: Self-pay | Admitting: Nurse Practitioner

## 2021-03-19 DIAGNOSIS — M67813 Other specified disorders of tendon, right shoulder: Secondary | ICD-10-CM

## 2021-03-20 DIAGNOSIS — M25511 Pain in right shoulder: Secondary | ICD-10-CM | POA: Diagnosis not present

## 2021-03-25 NOTE — Progress Notes (Signed)
MEDICARE ANNUAL WELLNESS VISIT  Assessment:    [name] was seen today for follow-up.  Diagnoses and all orders for this visit:  Encounter for Medicare annual wellness exam  Due Annually  Health Maintenance Reviewed  Healthy lifestyle reviewed and goals set  Hyperlipidemia, unspecified hyperlipidemia type Has increased Crestor 5mg  to daily but has not been consistent with taking medication. Receptive to lifestyle modification recommendations. Discussed current diet in detail, recommend she cut down on crackers/cookies, processed foods, continue to limit animal fats, push high fiber items, discussed soluble fiber supplement.  Defer lipids today drawn 1 month ago  Elevated BP without diagnosis of hypertension -  DASH diet, exercise and monitor at home. Call if greater than 130/80.    Hypothyroidism, unspecified type -     TSH Pt is extremely reluctant to take medication.  Advised if TSH is less than 3 will stop medication and monitor regularly  Atherosclerosis of aorta ([place]) Per Abd CT 2014 Control blood pressure, cholesterol, glucose, increase exercise.   Vitamin D deficiency Encouraged supplement for goal range of 60-100  Smoker -     DG Chest 2 View; Future Smoking cessation-  instruction/counseling given, counseled patient on the dangers of tobacco use, advised patient to stop smoking, and reviewed strategies to maximize success, patient is gradually cutting down.  Refuses low dose CT  Medication management Continued  Skin lesion of left arm  Instructed to keep area clean and dry Td given    Over 40 minutes of exam, counseling, chart review and critical decision making was performed Future Appointments  Date Time Provider [place]  03/31/2022 11:00 AM Magda Bernheim, NP [facility] None     Plan:   During the course of the visit the patient was educated and counseled about appropriate screening and preventive services including:   Pneumococcal  vaccine  Prevnar 13 Influenza vaccine Td vaccine Screening electrocardiogram Bone densitometry screening Colorectal cancer screening Diabetes screening Glaucoma screening Nutrition counseling  Advanced directives: requested   Subjective:  [name] is a 70 y.o. female who presents for Medicare Annual Wellness Visit.  Her has been evaluated by orthopedic- has bicep tear but is doing conservative management and is healing. Pain is greatly improved.    She did cut left forearm earlier in the week, uncertain on what.  Area shows no signs of infection and is healing well.   Dizziness persists, has appointment with ENT Dr. Cletis [place] 04/22/21 for further evaluation  She is retired from [employer] [facility] and she was enjoying it.   she currently continues to smoke 1 pack a week; intermittently since 1979, never more than 0.25 pack/day, discussed risks associated with smoking, patient is ready to quit and is cutting down gradually. She has a 30+ pack year history, last screening was negative CXR in 02/2020 refuses Chest CT for screening will pursue another Xray     She has abdominal aortic atherosclerosis per CT 2014 She does not check BP at home; today their BP is BP: 108/70  She does workout, she walks with friend 3-4 days a week She denies chest pain, shortness of breath, dizziness.   BMI is Body mass index is 22.78 kg/m., she is working on diet and exercise. Wt Readings from Last 3 Encounters:  03/28/21 112 lb 12.8 oz (51.2 kg)  02/22/21 112 lb 9.6 oz (51.1 kg)  02/23/20 110 lb 9.6 oz (50.2 kg)   She is on cholesterol medication, recently has increased Crestor 5 mg to daily  but has not been consistent with taking the medication. She is eating salads, vegetables, baked proteins, using olive oil.  Her cholesterol is not at goal. The cholesterol last visit was:   Lab Results  Component Value Date   CHOL 267 (H) 02/22/2021   HDL 73 02/22/2021   LDLCALC 170 (H) 02/22/2021    LDLDIRECT 166.3 03/05/2011   TRIG 117 02/22/2021   CHOLHDL 3.7 02/22/2021    Last A1C in the office was:   Lab Results  Component Value Date   HGBA1C 5.2 02/23/2020   She was started on Levothyroxine 25 mcg daily last visit for elevated TSH, she is very reluctant to take the medication Lab Results  Component Value Date   TSH 5.98 (H) 02/22/2021    Last GFR: Lab Results  Component Value Date   GFRNONAA 82 02/23/2020   Patient is not on Vitamin D supplement but willing to start;    Lab Results  Component Value Date   VD25OH 26 (L) 02/23/2020       Medication Review: Current Outpatient Medications on File Prior to Visit  Medication Sig Dispense Refill   docusate sodium (COLACE) 100 MG capsule Take 100 mg by mouth 2 (two) times daily.     rosuvastatin (CRESTOR) 5 MG tablet Take 1 tablet (5 mg total) by mouth daily. 30 tablet 3   azithromycin (ZITHROMAX) 250 MG tablet Take 2 tablets (500 mg) on  Day 1,  followed by 1 tablet (250 mg) once daily on Days 2 through 5. (Patient not taking: Reported on 03/28/2021) 6 each 1   cyclobenzaprine (FLEXERIL) 5 MG tablet Take 1 tablet (5 mg total) by mouth 3 (three) times daily as needed for muscle spasms. (Patient not taking: Reported on 03/28/2021) 60 tablet 0   dexamethasone (DECADRON) 0.5 MG tablet Take 1 tab 3 x day - 3 days, then 2 x day - 3 days, then 1 tab daily (Patient not taking: Reported on 03/28/2021) 20 tablet 0   levothyroxine (SYNTHROID) 25 MCG tablet Take 1 tablet (25 mcg total) by mouth daily before breakfast. 30 tablet 2   meclizine (ANTIVERT) 25 MG tablet Take 1 tablet (25 mg total) by mouth 3 (three) times daily as needed for dizziness. (Patient not taking: Reported on 03/28/2021) 30 tablet 0   naproxen sodium (ALEVE) 220 MG tablet Take 220 mg by mouth. (Patient not taking: Reported on 03/28/2021)     traZODone (DESYREL) 50 MG tablet TAKE 1 TO 1 AND 1/2 TABLETS BY MOUTH DAILY FOR SLEEP (Patient not taking: No sig reported) 135  tablet 1   No current facility-administered medications on file prior to visit.    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   Vitamin D deficiency 09/03/2018   Atherosclerosis of aorta ([place]) 03/31/2018   Personal history of benign carcinoid tumor 03/19/2017   External hemorrhoids with complication 71/24/5809   RENAL CALCULUS 08/16/2007   Hyperlipidemia 08/09/2007    Screening Tests Immunization History  Administered Date(s) Administered   Influenza, High Dose Seasonal PF 02/11/2018, 02/23/2020   Influenza-Unspecified 02/17/2019, 02/21/2021   PFIZER Comirnaty(Gray Top)Covid-19 Tri-Sucrose Vaccine 03/15/2020   PFIZER(Purple Top)SARS-COV-2 Vaccination 07/12/2019, 08/02/2019   Pfizer Covid-19 Vaccine Bivalent Booster 24yrs & up 01/29/2021   Pneumococcal Conjugate-13 04/30/2015   Pneumococcal Polysaccharide-23 03/31/2018   Td 08/02/1996  Tdap 03/10/2011   Tetanus:  03/28/21 Prevnar 13:  2016 Pneumonia: 2019 Influenza: 02/23/20 Shingles: interested in shingrix Covid 19: 2/2, 2021  Pap: 2016 neg HPV normal PAP declines another MGM: 02/14/20 negative repeat 1 year , plans to have done at the [facility] in [place] DEXA: declines  Colonoscopy: 2012, due now will call to schedule CXR 05/2016  CT abd: 2014, abdominal aortic atherosclerosis   Names of Other Physician/Practitioners you currently use: 1. [facility] here for primary care 2. Last Eye exam:  [facility] specialist,2020 3. Last dental exam: upper dentures, partial on bottom  Patient Care Team: Unk Pinto, MD as PCP - General (Internal Medicine)  SURGICAL HISTORY She  has a past surgical history that includes Vaginal hysterectomy; Knee surgery (07/2006); Breast cyst  excision; Colonoscopy (nov 2011); laparoscopy (N/A, 12/10/2016); and laparotomy (N/A, 12/10/2016). FAMILY HISTORY Her family history includes Alzheimer's disease (age of onset: 26) in her mother; Atrial fibrillation in her mother; Heart disease in her father; Hypertension in her father; Leukemia in her maternal grandmother, paternal grandfather, and paternal uncle; Multiple sclerosis in her sister; Thyroid disease in her mother.   SOCIAL HISTORY She  reports that she has been smoking cigarettes. She started smoking about 43 years ago. She has a 2.40 pack-year smoking history. She has never used smokeless tobacco. She reports that she does not drink alcohol and does not use drugs.    MEDICARE WELLNESS OBJECTIVES: Physical activity: Current Exercise Habits: Home exercise routine, Type of exercise: walking, Time (Minutes): 45, Frequency (Times/Week): 3, Weekly Exercise (Minutes/Week): 135, Intensity: Mild, Exercise limited by: None identified Cardiac risk factors: Cardiac Risk Factors include: advanced age (>33men, >45 women);dyslipidemia;smoking/ tobacco exposure;family history of premature cardiovascular disease Depression/mood screen:   Depression screen [facility] 2/9 03/28/2021  Decreased Interest 0  Down, Depressed, Hopeless 0  PHQ - 2 Score 0  Altered sleeping -  Tired, decreased energy -  Change in appetite -  Feeling bad or failure about yourself  -  Trouble concentrating -  Moving slowly or fidgety/restless -  Suicidal thoughts -  PHQ-9 Score -  Difficult doing work/chores -    ADLs:  In your present state of health, do you have any difficulty performing the following activities: 03/28/2021  Hearing? N  Vision? N  Difficulty concentrating or making decisions? N  Walking or climbing stairs? N  Dressing or bathing? N  Doing errands, shopping? N  Some recent data might be hidden      Cognitive Testing  Alert? Yes  Normal Appearance?Yes  Oriented to person? Yes  Place? Yes   Time?  Yes  Recall of three objects?  Yes  Can perform simple calculations? Yes  Displays appropriate judgment?Yes  Can read the correct time from a watch face?Yes  EOL planning: Does Patient Have a Medical Advance Directive?: No Would patient like information on creating a medical advance directive?: No - Patient declined  Review of Systems  Constitutional: Negative.  Negative for chills, fever and weight loss.  HENT: Negative.  Negative for congestion and hearing loss.   Eyes: Negative.  Negative for blurred vision and double vision.  Respiratory: Negative.  Negative for cough and shortness of breath.   Cardiovascular: Negative.  Negative for chest pain, palpitations, orthopnea and leg swelling.  Gastrointestinal: Negative.  Negative for abdominal pain, constipation, diarrhea, heartburn, nausea and vomiting.  Genitourinary: Negative.   Musculoskeletal:  Positive for joint pain (right shoulder). Negative for falls and myalgias.  Skin:  Negative.  Negative for rash.       Small cut on left forearm  Neurological:  Positive for dizziness. Negative for tingling, tremors, loss of consciousness and headaches.  Endo/Heme/Allergies:  Bruises/bleeds easily.  Psychiatric/Behavioral:  Negative for depression, memory loss, substance abuse and suicidal ideas. The patient has insomnia. The patient is not nervous/anxious.     Objective:     Today's Vitals   03/28/21 1131  BP: 108/70  Pulse: 77  Resp: 16  Temp: 97.6 F (36.4 C)  SpO2: 97%  Weight: 112 lb 12.8 oz (51.2 kg)  Height: 4\' 11"  (1.499 m)   Body mass index is 22.78 kg/m.  General appearance: alert, no distress, WD/WN, female HEENT: normocephalic, sclerae anicteric, TMs pearly, nares patent, no discharge or erythema, pharynx normal Oral cavity: MMM, no lesions Neck: supple, no lymphadenopathy, no thyromegaly, no masses Heart: RRR, normal S1, S2, no murmurs Lungs: CTA bilaterally, no wheezes, rhonchi, or rales Abdomen: +bs, soft, non  tender, non distended, no masses, no hepatomegaly, no splenomegaly Skin: 2 cm laceration on left forearm, no signs of infection Musculoskeletal: nontender, no swelling, no obvious deformity Extremities: no edema, no cyanosis, no clubbing Pulses: 2+ symmetric, upper and lower extremities, normal cap refill Neurological: alert, oriented x 3, CN2-12 intact, strength normal upper extremities and lower extremities, sensation normal throughout, DTRs 2+ throughout, no cerebellar signs, gait normal Psychiatric: normal affect, intermittently tearful, behavior normal, pleasant    Medicare Attestation I have personally reviewed: The patient's medical and social history Their use of alcohol, tobacco or illicit drugs Their current medications and supplements The patient's functional ability including ADLs,fall risks, home safety risks, cognitive, and hearing and visual impairment Diet and physical activities Evidence for depression or mood disorders  The patient's weight, height, BMI, and visual acuity have been recorded in the chart.  I have made referrals, counseling, and provided education to the patient based on review of the above and I have provided the patient with a written personalized care plan for preventive services.     Magda Bernheim, NP   03/28/2021

## 2021-03-28 ENCOUNTER — Other Ambulatory Visit: Payer: Self-pay

## 2021-03-28 ENCOUNTER — Ambulatory Visit (INDEPENDENT_AMBULATORY_CARE_PROVIDER_SITE_OTHER): Payer: PPO | Admitting: Nurse Practitioner

## 2021-03-28 ENCOUNTER — Encounter: Payer: Self-pay | Admitting: Nurse Practitioner

## 2021-03-28 VITALS — BP 108/70 | HR 77 | Temp 97.6°F | Resp 16 | Ht 59.0 in | Wt 112.8 lb

## 2021-03-28 DIAGNOSIS — R030 Elevated blood-pressure reading, without diagnosis of hypertension: Secondary | ICD-10-CM

## 2021-03-28 DIAGNOSIS — Z0001 Encounter for general adult medical examination with abnormal findings: Secondary | ICD-10-CM | POA: Diagnosis not present

## 2021-03-28 DIAGNOSIS — Z23 Encounter for immunization: Secondary | ICD-10-CM | POA: Diagnosis not present

## 2021-03-28 DIAGNOSIS — Z0000 Encounter for general adult medical examination without abnormal findings: Secondary | ICD-10-CM

## 2021-03-28 DIAGNOSIS — R6889 Other general symptoms and signs: Secondary | ICD-10-CM

## 2021-03-28 DIAGNOSIS — E039 Hypothyroidism, unspecified: Secondary | ICD-10-CM

## 2021-03-28 DIAGNOSIS — Z79899 Other long term (current) drug therapy: Secondary | ICD-10-CM

## 2021-03-28 DIAGNOSIS — E559 Vitamin D deficiency, unspecified: Secondary | ICD-10-CM

## 2021-03-28 DIAGNOSIS — L989 Disorder of the skin and subcutaneous tissue, unspecified: Secondary | ICD-10-CM

## 2021-03-28 DIAGNOSIS — I700 Atherosclerosis of aorta: Secondary | ICD-10-CM | POA: Diagnosis not present

## 2021-03-28 DIAGNOSIS — F17200 Nicotine dependence, unspecified, uncomplicated: Secondary | ICD-10-CM

## 2021-03-28 DIAGNOSIS — E785 Hyperlipidemia, unspecified: Secondary | ICD-10-CM

## 2021-03-28 NOTE — Patient Instructions (Signed)

## 2021-03-29 LAB — TSH: TSH: 2.6 mIU/L (ref 0.40–4.50)

## 2021-04-22 DIAGNOSIS — H9319 Tinnitus, unspecified ear: Secondary | ICD-10-CM | POA: Diagnosis not present

## 2021-04-22 DIAGNOSIS — H8110 Benign paroxysmal vertigo, unspecified ear: Secondary | ICD-10-CM | POA: Diagnosis not present

## 2021-04-22 DIAGNOSIS — R42 Dizziness and giddiness: Secondary | ICD-10-CM | POA: Diagnosis not present

## 2021-04-22 DIAGNOSIS — H9190 Unspecified hearing loss, unspecified ear: Secondary | ICD-10-CM | POA: Diagnosis not present

## 2021-04-22 DIAGNOSIS — H903 Sensorineural hearing loss, bilateral: Secondary | ICD-10-CM | POA: Diagnosis not present

## 2021-05-15 DIAGNOSIS — J069 Acute upper respiratory infection, unspecified: Secondary | ICD-10-CM | POA: Diagnosis not present

## 2021-05-15 DIAGNOSIS — Z20828 Contact with and (suspected) exposure to other viral communicable diseases: Secondary | ICD-10-CM | POA: Diagnosis not present

## 2021-05-21 ENCOUNTER — Other Ambulatory Visit: Payer: Self-pay | Admitting: Nurse Practitioner

## 2021-05-21 DIAGNOSIS — E039 Hypothyroidism, unspecified: Secondary | ICD-10-CM

## 2021-06-19 ENCOUNTER — Other Ambulatory Visit: Payer: Self-pay | Admitting: Nurse Practitioner

## 2021-06-19 DIAGNOSIS — E785 Hyperlipidemia, unspecified: Secondary | ICD-10-CM

## 2021-07-21 IMAGING — MG DIGITAL SCREENING BILAT W/ CAD
4 series · 4 of 4 positions shown · non-contrast
Comparison: Previous exam(s).

CLINICAL DATA: Screening.

EXAM:
DIGITAL SCREENING BILATERAL MAMMOGRAM WITH CAD

[L MLO]
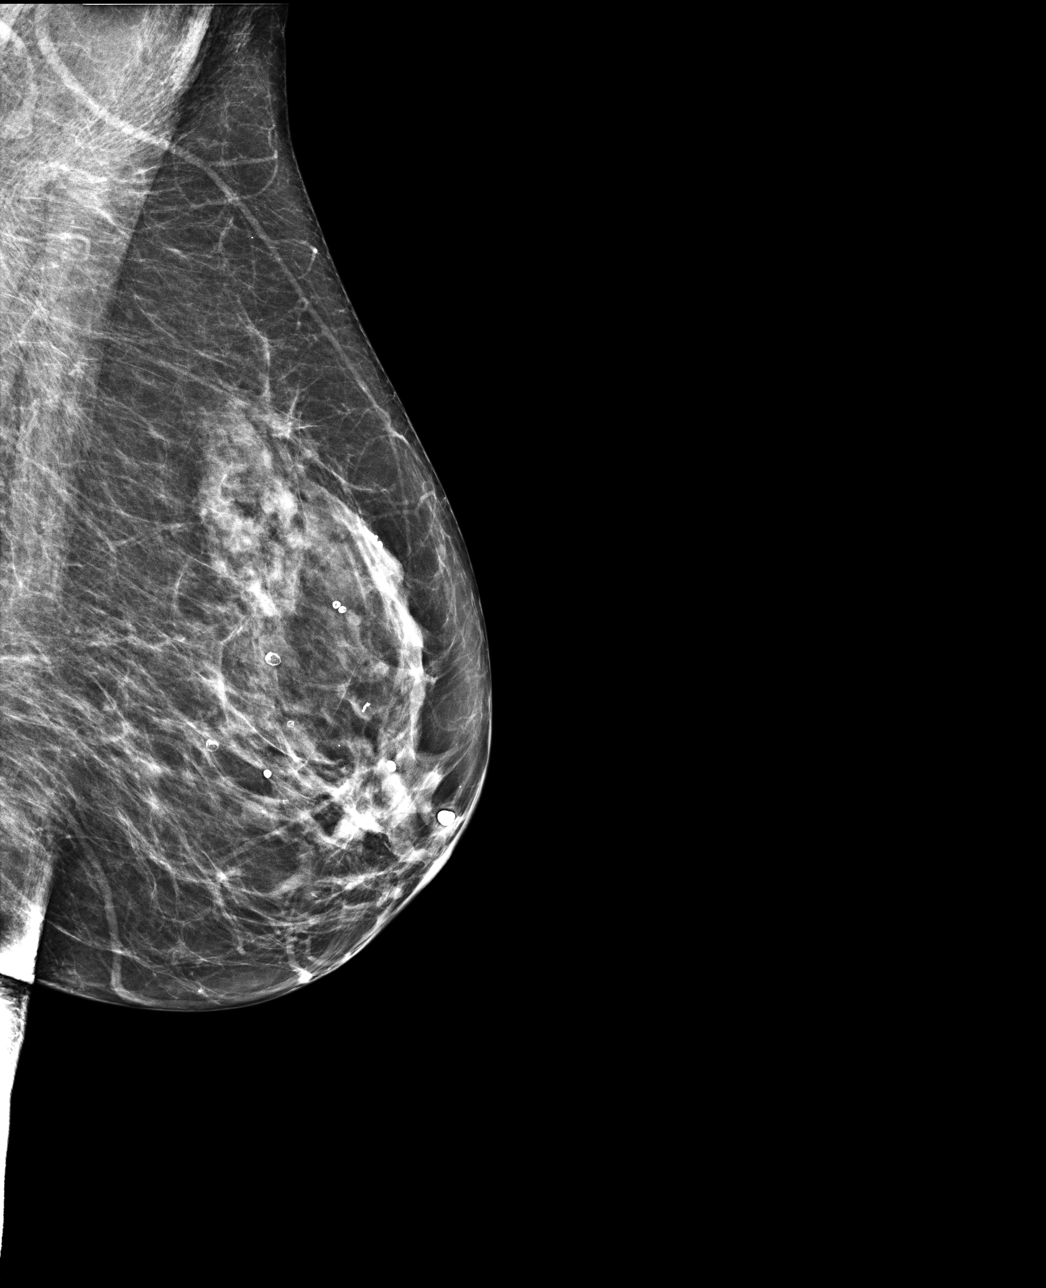

[R CC]
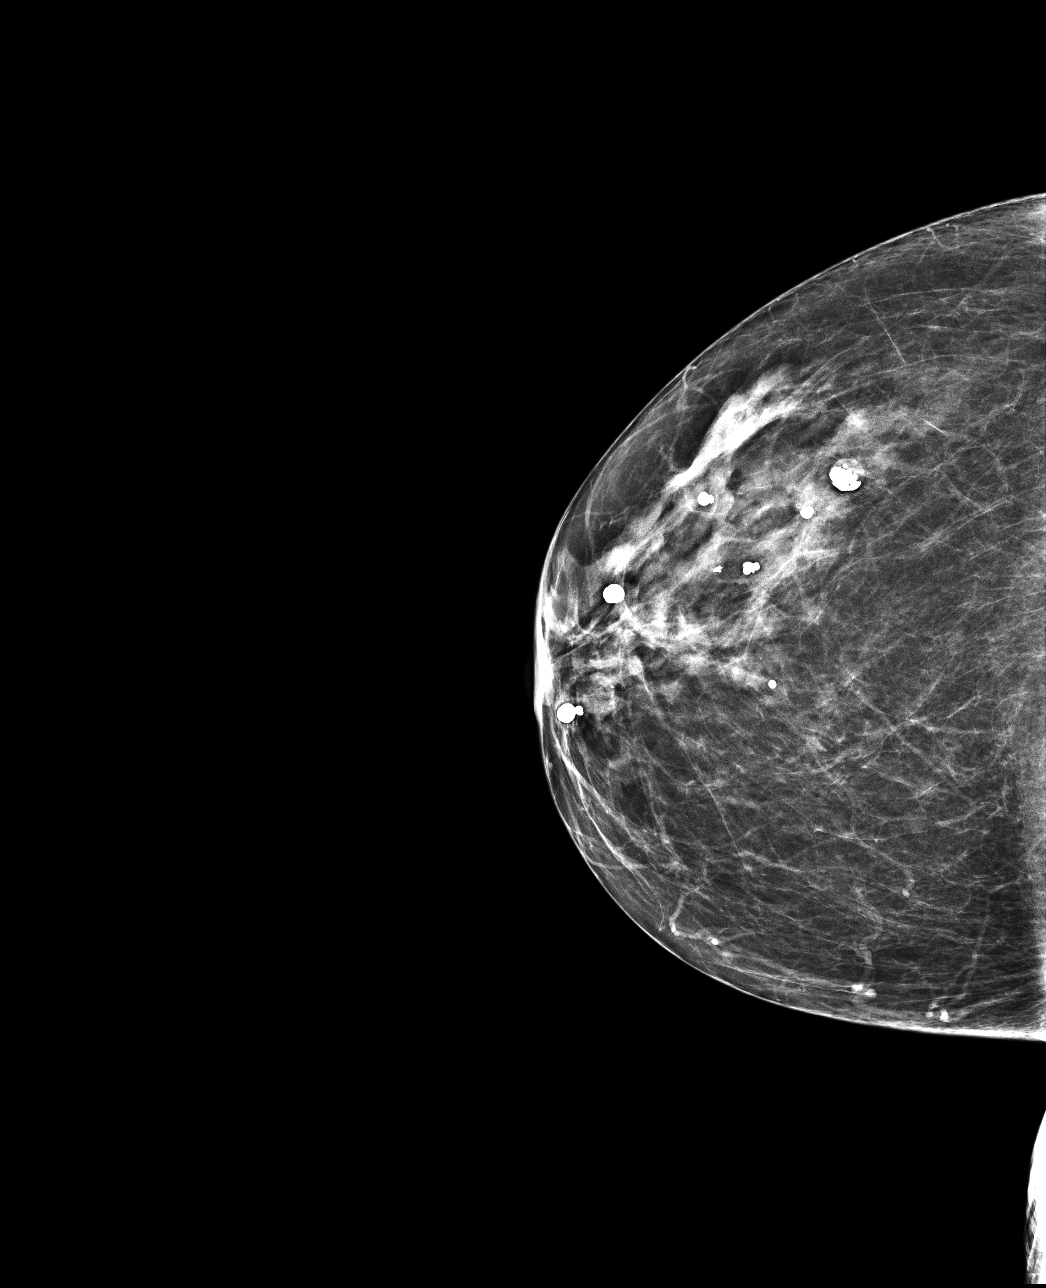

[L CC]
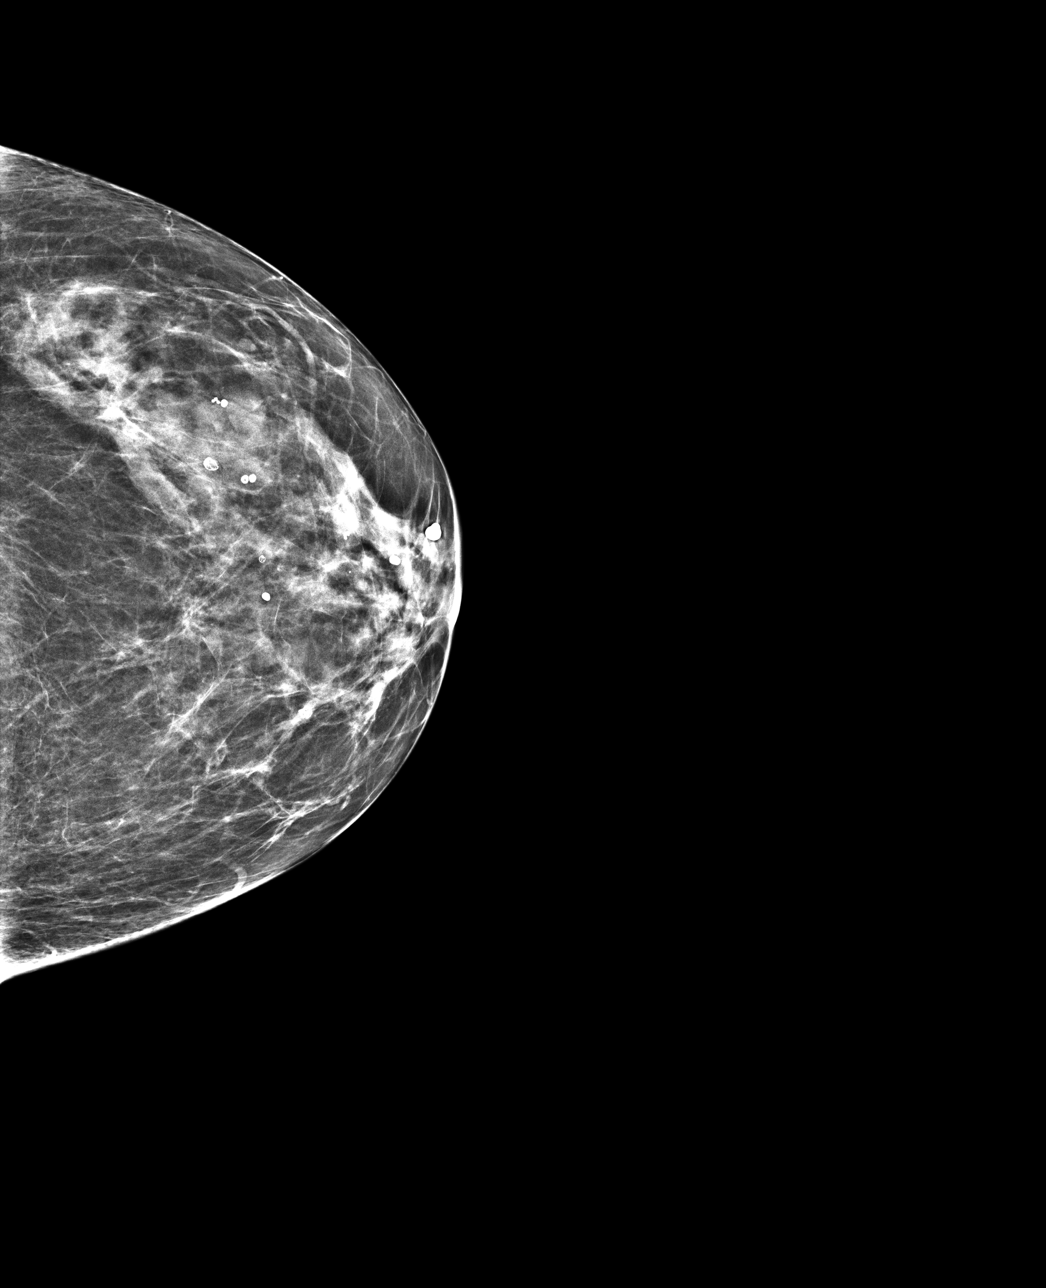

[R MLO]
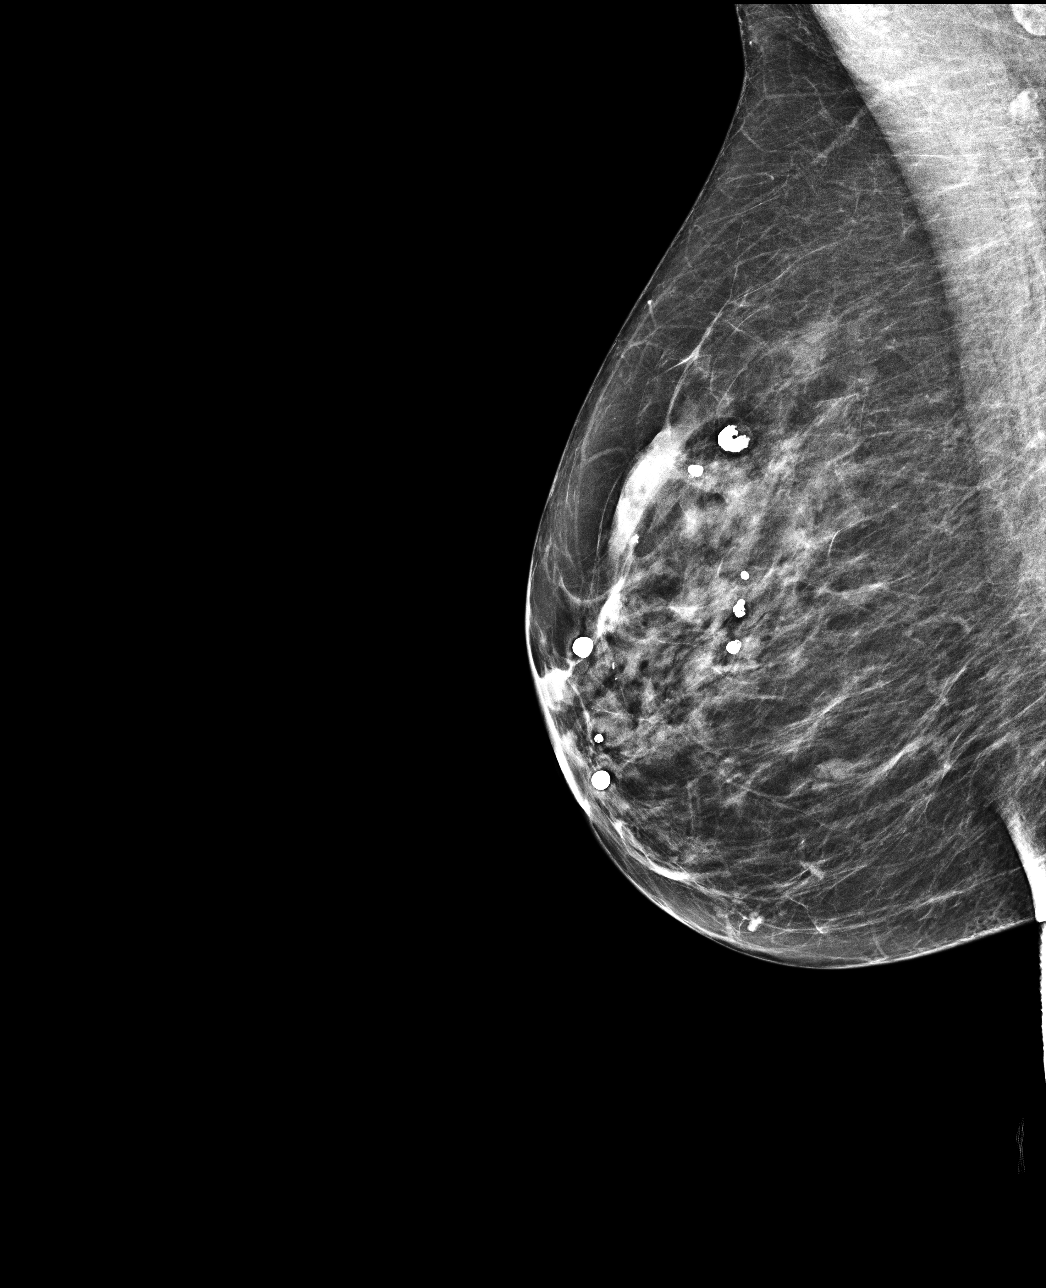

[4 of 4 positions shown; findings below may reference images not displayed]

ACR Breast Density Category c: The breast tissue is heterogeneously
dense, which may obscure small masses.
FINDINGS: There are no findings suspicious for malignancy. Images were
processed with CAD.
IMPRESSION: No mammographic evidence of malignancy. A result letter of this
screening mammogram will be mailed directly to the patient.

RECOMMENDATION:
Screening mammogram in one year. (Code:YJ-[id number])

BI-RADS CATEGORY  1: Negative.

## 2021-08-26 DIAGNOSIS — M79672 Pain in left foot: Secondary | ICD-10-CM | POA: Diagnosis not present

## 2021-08-26 DIAGNOSIS — M2012 Hallux valgus (acquired), left foot: Secondary | ICD-10-CM | POA: Diagnosis not present

## 2021-09-05 ENCOUNTER — Ambulatory Visit: Payer: PPO | Admitting: Adult Health

## 2021-09-11 ENCOUNTER — Ambulatory Visit: Payer: PPO | Admitting: Podiatry

## 2021-09-11 ENCOUNTER — Ambulatory Visit (INDEPENDENT_AMBULATORY_CARE_PROVIDER_SITE_OTHER): Payer: PPO

## 2021-09-11 DIAGNOSIS — M7752 Other enthesopathy of left foot: Secondary | ICD-10-CM | POA: Diagnosis not present

## 2021-09-11 DIAGNOSIS — M79672 Pain in left foot: Secondary | ICD-10-CM

## 2021-09-11 MED ORDER — MELOXICAM 15 MG PO TABS: 15.0000 mg | ORAL_TABLET | Freq: Every day | ORAL | 0 refills | Status: DC

## 2021-09-11 MED ORDER — METHYLPREDNISOLONE 4 MG PO TBPK: ORAL_TABLET | ORAL | 0 refills | Status: DC

## 2021-09-17 ENCOUNTER — Encounter: Payer: Self-pay | Admitting: Podiatry

## 2021-09-17 NOTE — Progress Notes (Signed)
?Subjective:  ?Patient ID: SHANDRELL BODA, female    DOB: December 06, 1950,  MRN: [id number] ? ?Chief Complaint  ?Patient presents with  ? Foot Pain  ?  Left foot pain  ?Pt stated that she fell about a month ago hit the top of her foot and has been having pain ever since she stated that the pain keeps her up at night   ? ? ?71 y.o. female presents with the above complaint.  Patient presents with left first metatarsophalangeal joint capsulitis.  Patient states she had it a month ago has progressed gotten worse.  She states the pain has been keeping her at night.  Is painful to touch painful to walk on she has not seen anyone as prior to seeing me.  She denies any other acute complaints.  She would like to discuss treatment options for this.  She has not taken any medication for this either. ? ? ?Review of Systems: Negative except as noted in the HPI. Denies N/V/F/Ch. ? ?Past Medical History:  ?Diagnosis Date  ? Arthritis   ? 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 Hemorrhoids   ? History of diverticulitis of colon   ? Hyperlipidemia   ? Kidney stones   ? ? ?Current Outpatient Medications:  ?  meloxicam (MOBIC) 15 MG tablet, Take 1 tablet (15 mg total) by mouth daily., Disp: 30 tablet, Rfl: 0 ?  methylPREDNISolone (MEDROL DOSEPAK) 4 MG TBPK tablet, Take as directed, Disp: 21 each, Rfl: 0 ?  azithromycin (ZITHROMAX) 250 MG tablet, Take 2 tablets (500 mg) on  Day 1,  followed by 1 tablet (250 mg) once daily on Days 2 through 5. (Patient not taking: Reported on 03/28/2021), Disp: 6 each, Rfl: 1 ?  cyclobenzaprine (FLEXERIL) 5 MG tablet, Take 1 tablet (5 mg total) by mouth 3 (three) times daily as needed for muscle spasms. (Patient not taking: Reported on 03/28/2021), Disp: 60 tablet, Rfl: 0 ?  dexamethasone (DECADRON) 0.5 MG tablet, Take 1 tab 3 x day - 3 days, then 2 x day - 3 days, then 1 tab daily (Patient not taking: Reported on 03/28/2021), Disp: 20 tablet, Rfl: 0 ?  docusate sodium  (COLACE) 100 MG capsule, Take 100 mg by mouth 2 (two) times daily., Disp: , Rfl:  ?  levothyroxine (SYNTHROID) 25 MCG tablet, TAKE 1 TABLET(25 MCG) BY MOUTH DAILY BEFORE AND BREAKFAST, Disp: 90 tablet, Rfl: 3 ?  meclizine (ANTIVERT) 25 MG tablet, Take 1 tablet (25 mg total) by mouth 3 (three) times daily as needed for dizziness. (Patient not taking: Reported on 03/28/2021), Disp: 30 tablet, Rfl: 0 ?  naproxen sodium (ALEVE) 220 MG tablet, Take 220 mg by mouth. (Patient not taking: Reported on 03/28/2021), Disp: , Rfl:  ?  rosuvastatin (CRESTOR) 5 MG tablet, TAKE 1 TABLET(5 MG) BY MOUTH DAILY, Disp: 90 tablet, Rfl: 3 ?  traZODone (DESYREL) 50 MG tablet, TAKE 1 TO 1 AND 1/2 TABLETS BY MOUTH DAILY FOR SLEEP (Patient not taking: No sig reported), Disp: 135 tablet, Rfl: 1 ? ?Social History  ? ?Tobacco Use  ?Smoking Status Every Day  ? Packs/day: 0.12  ? Years: 20.00  ? Pack years: 2.40  ? Types: Cigarettes  ? Start date: 7  ?Smokeless Tobacco Never  ?Tobacco Comments  ? pack lasts 1 week, never more than 0.25 pack in a day  ? ? ?No Known Allergies ?Objective:  ?There were no vitals filed for this visit. ?There is no height or weight on  file to calculate BMI. ?Constitutional Well developed. ?Well nourished.  ?Vascular Dorsalis pedis pulses palpable bilaterally. ?Posterior tibial pulses palpable bilaterally. ?Capillary refill normal to all digits.  ?No cyanosis or clubbing noted. ?Pedal hair growth normal.  ?Neurologic Normal speech. ?Oriented to person, place, and time. ?Epicritic sensation to light touch grossly present bilaterally.  ?Dermatologic Nails well groomed and normal in appearance. ?No open wounds. ?No skin lesions.  ?Orthopedic: Pain on palpation left first metatarsal phalangeal joint.  Pain with range of motion of the first MTP joint.  Mild deep intra-articular pain noted.  No sesamoid complex pain noted.  No extensor or flexor tendinitis noted.  ? ?Radiographs:  had a follow-up with 3 views of skeletally  mature adult left foot: Uneven joint space narrowing noted with osteoarthritic changes.  No loose bodies or osteophytes noted. ?Assessment:  ? ?1. Capsulitis of metatarsophalangeal (MTP) joint of left foot   ? ?Plan:  ?Patient was evaluated and treated and all questions answered. ? ?Left MTP capsulitis ?-Pain I explained the patient the etiology of capsulitis and worse treatment options were discussed.  Given the amount of pain she is having I believe she will benefit from a steroid injection to help decrease acute inflammatory component associate with pain.  Patient agrees with plan like to proceed with steroid injection. ?-A steroid injection was performed at Left first MTP using 1% plain Lidocaine and 10 mg of Kenalog. This was well tolerated. ?-Medrol Dosepak and meloxicam was sent to the pharmacy for acute inflammatory pain ? ? ?No follow-ups on file.  ?

## 2021-09-30 ENCOUNTER — Ambulatory Visit: Payer: PPO | Admitting: Internal Medicine

## 2021-10-01 NOTE — Progress Notes (Signed)
? ?Future Appointments  ?Date Time Provider Department  ?10/02/2021  2:30 PM Unk Pinto, MD [facility]  ?10/11/2021 11:30 AM Felipa Furnace, DPM [facility]  ?03/31/2022                Wellness 11:00 AM Magda Bernheim, NP [facility]  ? ? ?History of Present Illness: ? ? ?    This very nice 71 y.o. WWF presents for 6 month follow up with hx/o elevated BP, HLD, abnormal glucose,  hypothyroidism and Vitamin D Deficiency.  After last normal thyroid labs, patient was instructed to stop her thyroid replacement meds.  ? ? ?    Patient is tfollowed expectantly for elevated BP   & BP has been controlled and today?s BP is at goal - 114/74. Patient has had no complaints of any cardiac type chest pain, palpitations, dyspnea / orthopnea / PND, dizziness, claudication, or dependent edema. ? ? ?    Hyperlipidemia is not controlled with diet & Rosuvastatin. Patient denies myalgias or other med SE?s. Last Lipids were not at goal : ? ?Lab Results  ?Component Value Date  ? CHOL 267 (H) 02/22/2021  ? HDL 73 02/22/2021  ? LDLCALC 170 (H) 02/22/2021  ? LDLDIRECT 166.3 03/05/2011  ? TRIG 117 02/22/2021  ? CHOLHDL 3.7 02/22/2021  ? ? ? ?Also, the patient is monitored expectantly for glucose intolerance and has had no symptoms of reactive hypoglycemia, diabetic polys, paresthesias or visual blurring.  Last A1c was normal & at goal : ? ?Lab Results  ?Component Value Date  ? HGBA1C 5.2 02/23/2020  ? ?    ? ?                                                  Further, the patient also has history of Vitamin D Deficiency ("40" /2016)  and supplements Vitamin D.   Last vitamin D was still very low : ? ?Lab Results  ?Component Value Date  ? VD25OH 26 (L) 02/23/2020  ? ? ?Current Outpatient Medications on File Prior to Visit  ?Medication Sig  ? COLACE 100 MG capsule Take 100 mg 2 times daily.  ? levothyroxine  25 MCG tablet TAKE 1 TABLET DAILY BEFORE AND BREAKFAST  ? meloxicam (15 MG tablet Take 1 tablet (15 mg total) by mouth daily.  ? rosuvastatin   5 MG tablet TAKE 1 TABLET  DAILY  ? ? ?No Known Allergies ? ? ?PMHx:   ?Past Medical History:  ?Diagnosis Date  ? Arthritis   ? 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 Hemorrhoids   ? History of diverticulitis of colon   ? Hyperlipidemia   ? Kidney stones   ? ? ? ?Immunization History  ?Administered Date(s) Administered  ? Influenza, High Dose  02/11/2018, 02/23/2020  ? Influenza 02/17/2019, 02/21/2021  ? PFIZER Covid-19 Tri-Sucrose Vacc 03/15/2020  ? PFIZER-SARS-COV-2 Vacc 07/12/2019, 08/02/2019  ? Holiday representative 1 01/29/2021  ? Pneumococcal -13 04/30/2015  ? Pneumococcal -23 03/31/2018  ? Td 08/02/1996, 03/28/2021  ? Tdap 03/10/2011  ? ? ? ?Past Surgical History:  ?Procedure Laterality Date  ? BREAST CYST EXCISION    ? X2  ? COLONOSCOPY  nov 2011  ? KNEE SURGERY  07/2006  ? right  ? LAPAROSCOPY N/A 12/10/2016  ? Procedure: LAPAROSCOPY DIAGNOSTIC;  Surgeon: Coralie Keens,  MD;  Location: [facility];  Service: General;  Laterality: N/A;  ? LAPAROTOMY N/A 12/10/2016  ? Procedure: POSSIBLE EXPLORATORY LAPAROTOMY, POSSIBLE SMALL BOWEL RESECTION;  Surgeon: Coralie Keens, MD;  Location: [facility];  Service: General;  Laterality: N/A;  ? VAGINAL HYSTERECTOMY    ? total  ? ? ?FHx:    Reviewed / unchanged ? ?SHx:    Reviewed / unchanged  ? ?Systems Review: ? ?Constitutional: Denies fever, chills, wt changes, headaches, insomnia, fatigue, night sweats, change in appetite. ?Eyes: Denies redness, blurred vision, diplopia, discharge, itchy, watery eyes.  ?ENT: Denies discharge, congestion, post nasal drip, epistaxis, sore throat, earache, hearing loss, dental pain, tinnitus, vertigo, sinus pain, snoring.  ?CV: Denies chest pain, palpitations, irregular heartbeat, syncope, dyspnea, diaphoresis, orthopnea, PND, claudication or edema. ?Respiratory: denies cough, dyspnea, DOE, pleurisy, hoarseness, laryngitis, wheezing.  ?Gastrointestinal: Denies dysphagia, odynophagia, heartburn,  reflux, water brash, abdominal pain or cramps, nausea, vomiting, bloating, diarrhea, constipation, hematemesis, melena, hematochezia  or hemorrhoids. ?Genitourinary: Denies dysuria, frequency, urgency, nocturia, hesitancy, discharge, hematuria or flank pain. ?Musculoskeletal: Denies arthralgias, myalgias, stiffness, jt. swelling, pain, limping or strain/sprain.  ?Skin: Denies pruritus, rash, hives, warts, acne, eczema or change in skin lesion(s). ?Neuro: No weakness, tremor, incoordination, spasms, paresthesia or pain. ?Psychiatric: Denies confusion, memory loss or sensory loss. ?Endo: Denies change in weight, skin or hair change.  ?Heme/Lymph: No excessive bleeding, bruising or enlarged lymph nodes. ? ?Physical Exam ? ?BP 114/74   Pulse 68   Temp (!) 97.1 ?F (36.2 ?C)   Ht '4\' 11"'$  (1.499 m)   Wt 114 lb (51.7 kg)   SpO2 99%   BMI 23.03 kg/m?  ? ?Appears  well nourished, well groomed  and in no distress. ? ?Eyes: PERRLA, EOMs, conjunctiva no swelling or erythema. ?Sinuses: No frontal/maxillary tenderness ?ENT/Mouth: EAC's clear, TM's nl w/o erythema, bulging. Nares clear w/o erythema, swelling, exudates. Oropharynx clear without erythema or exudates. Oral hygiene is good. Tongue normal, non obstructing. Hearing intact.  ?Neck: Supple. Thyroid not palpable. Car 2+/2+ without bruits, nodes or JVD. ?Chest: Respirations nl with BS clear & equal w/o rales, rhonchi, wheezing or stridor.  ?Cor: Heart sounds normal w/ regular rate and rhythm without sig. murmurs, gallops, clicks or rubs. Peripheral pulses normal and equal  without edema.  ?Abdomen: Soft & bowel sounds normal. Non-tender w/o guarding, rebound, hernias, masses or organomegaly.  ?Lymphatics: Unremarkable.  ?Musculoskeletal: Full ROM all peripheral extremities, joint stability, 5/5 strength and normal gait.  ?Skin: Warm, dry without exposed rashes, lesions or ecchymosis apparent.  ?Neuro: Cranial nerves intact, reflexes equal bilaterally. Sensory-motor  testing grossly intact. Tendon reflexes grossly intact.  ?Pysch: Alert & oriented x 3.  Insight and judgement nl & appropriate. No ideations. ? ?Assessment and Plan: ? ?1. Elevated BP without diagnosis of hypertension ? ?- Continue medication, monitor blood pressure at home.  ?- Continue DASH diet.  Reminder to go to the ER if any CP,  ?SOB, nausea, dizziness, severe HA, changes vision/speech. ?  ?- CBC with Differential/Platelet ?- COMPLETE METABOLIC PANEL WITH GFR ?- Magnesium ?- TSH ? ?2. Hyperlipidemia, mixed ? ?- Continue diet/meds, exercise,& lifestyle modifications.  ?- Continue monitor periodic cholesterol/liver & renal functions  ?  ?- Lipid panel ?- TSH ? ?3. Abnormal glucose ? ?- Hemoglobin A1c ?- Insulin, random ? ?4. Vitamin D deficiency ? ?- Continue diet, exercise  ?- Lifestyle modifications.  ?- Monitor appropriate labs. ?- Continue supplementation. ?- VITAMIN D 25 Hydroxy  ? ?5. Hypothyroidism ? ?- TSH ? ?  6. Medication management ? ?- CBC with Differential/Platelet ?- COMPLETE METABOLIC PANEL WITH GFR ?- Magnesium ?- Lipid panel ?- TSH ?- Hemoglobin A1c ?- Insulin, random ?- VITAMIN D 25 Hydroxy  ? ? ? ?      Discussed  regular exercise, BP monitoring, weight control to achieve/maintain BMI less than 25 and discussed med and SE's. Recommended labs to assess /monitor clinical status .  I discussed the assessment and treatment plan with the patient. The patient was provided an opportunity to ask questions and all were answered. The patient agreed with the plan and demonstrated an understanding of the instructions.  I provided over 30 minutes of exam, counseling, chart review and  complex critical decision making. ? ?      The patient was advised to call back or seek an in-person evaluation if the symptoms worsen or if the condition fails to improve as anticipated. ? ? ?Kirtland Bouchard, MD ? ?

## 2021-10-01 NOTE — Patient Instructions (Signed)

## 2021-10-02 ENCOUNTER — Encounter: Payer: Self-pay | Admitting: Internal Medicine

## 2021-10-02 ENCOUNTER — Ambulatory Visit (INDEPENDENT_AMBULATORY_CARE_PROVIDER_SITE_OTHER): Payer: PPO | Admitting: Internal Medicine

## 2021-10-02 VITALS — BP 114/74 | HR 68 | Temp 97.1°F | Ht 59.0 in | Wt 114.0 lb

## 2021-10-02 DIAGNOSIS — E559 Vitamin D deficiency, unspecified: Secondary | ICD-10-CM | POA: Diagnosis not present

## 2021-10-02 DIAGNOSIS — E782 Mixed hyperlipidemia: Secondary | ICD-10-CM

## 2021-10-02 DIAGNOSIS — Z79899 Other long term (current) drug therapy: Secondary | ICD-10-CM | POA: Diagnosis not present

## 2021-10-02 DIAGNOSIS — R7309 Other abnormal glucose: Secondary | ICD-10-CM | POA: Diagnosis not present

## 2021-10-02 DIAGNOSIS — R030 Elevated blood-pressure reading, without diagnosis of hypertension: Secondary | ICD-10-CM

## 2021-10-02 DIAGNOSIS — E039 Hypothyroidism, unspecified: Secondary | ICD-10-CM

## 2021-10-03 LAB — COMPLETE METABOLIC PANEL WITH GFR
AG Ratio: 2.2 (calc) (ref 1.0–2.5)
ALT: 28 U/L (ref 6–29)
AST: 23 U/L (ref 10–35)
Albumin: 4.1 g/dL (ref 3.6–5.1)
Alkaline phosphatase (APISO): 69 U/L (ref 37–153)
BUN: 21 mg/dL (ref 7–25)
CO2: 25 mmol/L (ref 20–32)
Calcium: 8.8 mg/dL (ref 8.6–10.4)
Chloride: 112 mmol/L — ABNORMAL HIGH (ref 98–110)
Creat: 0.8 mg/dL (ref 0.50–1.05)
Globulin: 1.9 g/dL (calc) (ref 1.9–3.7)
Glucose, Bld: 85 mg/dL (ref 65–99)
Potassium: 4.8 mmol/L (ref 3.5–5.3)
Sodium: 145 mmol/L (ref 135–146)
Total Bilirubin: 0.2 mg/dL (ref 0.2–1.2)
Total Protein: 6 g/dL — ABNORMAL LOW (ref 6.1–8.1)
eGFR: 80 mL/min/{1.73_m2} (ref >=60)

## 2021-10-03 LAB — CBC WITH DIFFERENTIAL/PLATELET
Absolute Monocytes: 570 cells/uL (ref 200–950)
Basophils Absolute: 58 cells/uL (ref 0–200)
Basophils Relative: 0.9 %
Eosinophils Absolute: 122 cells/uL (ref 15–500)
Eosinophils Relative: 1.9 %
HCT: 37.8 % (ref 35.0–45.0)
Hemoglobin: 12.4 g/dL (ref 11.7–15.5)
Lymphs Abs: 1523 cells/uL (ref 850–3900)
MCH: 30.8 pg (ref 27.0–33.0)
MCHC: 32.8 g/dL (ref 32.0–36.0)
MCV: 93.8 fL (ref 80.0–100.0)
MPV: 11.1 fL (ref 7.5–12.5)
Monocytes Relative: 8.9 %
Neutro Abs: 4128 cells/uL (ref 1500–7800)
Neutrophils Relative %: 64.5 %
Platelets: 224 10*3/uL (ref 140–400)
RBC: 4.03 10*6/uL (ref 3.80–5.10)
RDW: 11.9 % (ref 11.0–15.0)
Total Lymphocyte: 23.8 %
WBC: 6.4 10*3/uL (ref 3.8–10.8)

## 2021-10-03 LAB — LIPID PANEL
Cholesterol: 147 mg/dL (ref <200)
HDL: 73 mg/dL (ref >=50)
LDL Cholesterol (Calc): 55 mg/dL (calc)
Non-HDL Cholesterol (Calc): 74 mg/dL (calc) (ref <130)
Total CHOL/HDL Ratio: 2 (calc) (ref <5.0)
Triglycerides: 102 mg/dL (ref <150)

## 2021-10-03 LAB — VITAMIN D 25 HYDROXY (VIT D DEFICIENCY, FRACTURES): Vit D, 25-Hydroxy: 80 ng/mL (ref 30–100)

## 2021-10-03 LAB — TSH: TSH: 3.65 mIU/L (ref 0.40–4.50)

## 2021-10-03 LAB — HEMOGLOBIN A1C
Hgb A1c MFr Bld: 5.3 % of total Hgb (ref <5.7)
Mean Plasma Glucose: 105 mg/dL
eAG (mmol/L): 5.8 mmol/L

## 2021-10-03 LAB — INSULIN, RANDOM: Insulin: 4.4 u[IU]/mL

## 2021-10-03 LAB — MAGNESIUM: Magnesium: 2 mg/dL (ref 1.5–2.5)

## 2021-10-03 NOTE — Progress Notes (Signed)
<><><><><><><><><><><><><><><><><><><><><><><><><><><><><><><><><> <><><><><><><><><><><><><><><><><><><><><><><><><><><><><><><><><> -   Test results slightly outside the reference range are not unusual. If there is anything important, I will review this with you,  otherwise it is considered normal test values.  If you have further questions,  please do not hesitate to contact me at the office or via My Chart.  <><><><><><><><><><><><><><><><><><><><><><><><><><><><><><><><><> <><><><><><><><><><><><><><><><><><><><><><><><><><><><><><><><><>  -  Lipids / Cholesterol Much, Much Better since starting Crestor / Rosuvastatin   - Total Chol down from 267 to now 147  &   Bad LDL Chol down from 170 to now 55 -> EXCELLENT RESULTS ! <><><><><><><><><><><><><><><><><><><><><><><><><><><><><><><><><> <><><><><><><><><><><><><><><><><><><><><><><><><><><><><><><><><>  - Vitamin D  = 80 -  Excellent   - Very low risk for Heart Attack  / Stroke <><><><><><><><><><><><><><><><><><><><><><><><><><><><><><><><><>  -  All Else - CBC - Kidneys - Electrolytes - Liver - Magnesium & Thyroid    - all  Normal / OK <><><><><><><><><><><><><><><><><><><><><><><><><><><><><><><><><>  -  Keep up the  Great  Work   !  !  <><><><><><><><><><><><><><><><><><><><><><><><><><><><><><><><><> <><><><><><><><><><><><><><><><><><><><><><><><><><><><><><><><><>

## 2021-10-08 ENCOUNTER — Other Ambulatory Visit: Payer: Self-pay | Admitting: Podiatry

## 2021-10-11 ENCOUNTER — Ambulatory Visit: Payer: PPO | Admitting: Podiatry

## 2021-10-11 DIAGNOSIS — M7752 Other enthesopathy of left foot: Secondary | ICD-10-CM

## 2021-10-11 DIAGNOSIS — Q666 Other congenital valgus deformities of feet: Secondary | ICD-10-CM

## 2021-10-16 NOTE — Progress Notes (Signed)
Subjective:  Patient ID: [name], female    DOB: [birth year],  MRN: [id number]  No chief complaint on file.   71 y.o. female presents with the above complaint.  Patient presents with follow-up of first metatarsophalangeal joint capsulitis on the left.  She states that the injection helped considerably.  She would like to know if she can do another 1.  She does not wear any orthotics.  She denies any other acute complaints.  She would like to discuss next treatment plan.   Review of Systems: Negative except as noted in the HPI. Denies N/V/F/Ch.  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   Hemorrhoids    History of diverticulitis of colon    Hyperlipidemia    Kidney stones     Current Outpatient Medications:    docusate sodium (COLACE) 100 MG capsule, Take 100 mg by mouth 2 (two) times daily., Disp: , Rfl:    levothyroxine (SYNTHROID) 25 MCG tablet, TAKE 1 TABLET(25 MCG) BY MOUTH DAILY BEFORE AND BREAKFAST (Patient not taking: Reported on 10/02/2021), Disp: 90 tablet, Rfl: 3   meloxicam (MOBIC) 15 MG tablet, TAKE 1 TABLET(15 MG) BY MOUTH DAILY, Disp: 30 tablet, Rfl: 0   rosuvastatin (CRESTOR) 5 MG tablet, TAKE 1 TABLET(5 MG) BY MOUTH DAILY, Disp: 90 tablet, Rfl: 3   Vitamin D, Ergocalciferol, (DRISDOL) 1.25 MG (50000 UNIT) CAPS capsule, Take 50,000 Units by mouth daily., Disp: , Rfl:    zinc gluconate 50 MG tablet, Take 50 mg by mouth daily., Disp: , Rfl:   Social History   Tobacco Use  Smoking Status Every Day   Packs/day: 0.12   Years: 20.00   Pack years: 2.40   Types: Cigarettes   Start date: 1979  Smokeless Tobacco Never  Tobacco Comments   pack lasts 1 week, never more than 0.25 pack in a day    No Known Allergies Objective:  There were no vitals filed for this visit. There is no height or weight on file to calculate BMI. Constitutional Well developed. Well nourished.  Vascular  Dorsalis pedis pulses palpable bilaterally. Posterior tibial pulses palpable bilaterally. Capillary refill normal to all digits.  No cyanosis or clubbing noted. Pedal hair growth normal.  Neurologic Normal speech. Oriented to person, place, and time. Epicritic sensation to light touch grossly present bilaterally.  Dermatologic Nails well groomed and normal in appearance. No open wounds. No skin lesions.  Orthopedic: Pain on palpation left first metatarsal phalangeal joint.  Pain with range of motion of the first MTP joint.  Mild deep intra-articular pain noted.  No sesamoid complex pain noted.  No extensor or flexor tendinitis noted.   Radiographs:  had a follow-up with 3 views of skeletally mature adult left foot: Uneven joint space narrowing noted with osteoarthritic changes.  No loose bodies or osteophytes noted. Assessment:   1. Capsulitis of metatarsophalangeal (MTP) joint of left foot   2. Pes planovalgus     Plan:  Patient was evaluated and treated and all questions answered.  Left first MTP capsulitis -Pain I explained the patient the etiology of capsulitis and worse treatment options were discussed.  Given the amount of pain she is having I believe she will benefit from a steroid injection to help decrease acute inflammatory component associate with pain.  Patient agrees with plan like to proceed with steroid injection. -A second steroid injection was performed at Left first MTP using 1% plain  Lidocaine and 10 mg of Kenalog. This was well tolerated. -Continue meloxicam as needed  Pes planovalgus -I explained to patient the etiology of pes planovalgus and relationship with first MTP capsulitis and various treatment options were discussed.  Given patient foot structure in the setting of first MTP capsulitis I believe patient will benefit from custom-made orthotics to help control the hindfoot motion support the arch of the foot and take the stress away from forefoot.  Patient  agrees with the plan like to proceed with orthotics -Patient was casted for orthotics with offloading of bilateral first metatarsophalangeal joint with soft Morton's extension    No follow-ups on file.

## 2021-10-17 DIAGNOSIS — J069 Acute upper respiratory infection, unspecified: Secondary | ICD-10-CM | POA: Diagnosis not present

## 2021-11-07 ENCOUNTER — Other Ambulatory Visit: Payer: Self-pay | Admitting: Podiatry

## 2021-11-18 ENCOUNTER — Other Ambulatory Visit: Payer: Self-pay | Admitting: Nurse Practitioner

## 2021-11-18 ENCOUNTER — Telehealth: Payer: Self-pay | Admitting: Nurse Practitioner

## 2021-11-18 DIAGNOSIS — K120 Recurrent oral aphthae: Secondary | ICD-10-CM

## 2021-11-18 MED ORDER — LIDOCAINE VISCOUS HCL 2 % MT SOLN: 15.0000 mL | OROMUCOSAL | 0 refills | Status: DC | PRN

## 2021-11-18 NOTE — Telephone Encounter (Signed)
I have sent the lidocaine to that pharmacy

## 2021-11-18 NOTE — Telephone Encounter (Signed)
Pt thinks she has a canker sore and has had it before, it looks like she had lidocaine 2% prescribed back in 2018. Wanting to see if [place] can get [place] I told her typically we dont send it medicine without an appt and asked if she had tried gargling with salt water. She said she had but nothing had worked. If we are able to send it tin, the pharmacy is [facility]  [phone] in [facility]

## 2021-11-28 ENCOUNTER — Ambulatory Visit (INDEPENDENT_AMBULATORY_CARE_PROVIDER_SITE_OTHER): Payer: PPO

## 2021-11-28 DIAGNOSIS — M7752 Other enthesopathy of left foot: Secondary | ICD-10-CM

## 2021-11-28 DIAGNOSIS — Q666 Other congenital valgus deformities of feet: Secondary | ICD-10-CM | POA: Diagnosis not present

## 2021-11-28 DIAGNOSIS — M7751 Other enthesopathy of right foot: Secondary | ICD-10-CM

## 2021-11-28 NOTE — Progress Notes (Signed)
Reason for Visit:        Fitting and Delivery of Custom Fabricated Foot Orthotics Patient Report:            Patient reports comfort and is satisfied with device.   ACTIONS PERFORMED Patient was fit with foot orthotics trimmed to shoe last. Patient tolerated fitting procedure.    Patient was provided with verbal and written instruction and demonstration regarding  wear, care, proper fit, function, purpose, cleaning, and use of the orthosis and in all related precautions and risks and benefits regarding the orthosis.   Patient was also provided with verbal instruction regarding how to report any failures or malfunctions of the orthosis and necessary follow up care. Patient was also instructed to contact our office regarding any change in status that may affect the function of the orthosis.   Patient demonstrated independence with proper donning, doffing, and fit and verbalized understanding of all instructions.   PLAN: Patient is to follow up in one week or as necessary (PRN). All questions were answered and concerns addressed. 

## 2021-12-05 ENCOUNTER — Other Ambulatory Visit: Payer: Self-pay | Admitting: Podiatry

## 2022-03-12 DIAGNOSIS — R0981 Nasal congestion: Secondary | ICD-10-CM | POA: Diagnosis not present

## 2022-03-27 NOTE — Progress Notes (Signed)
MEDICARE ANNUAL WELLNESS VISIT  Assessment:    [name] was seen today for follow-up.  Diagnoses and all orders for this visit:  Encounter for Medicare annual wellness exam  Due Annually  Health Maintenance Reviewed  Healthy lifestyle reviewed and goals set  Hyperlipidemia, unspecified hyperlipidemia type Has increased Crestor 76m to daily but has not been consistent with taking medication. Receptive to lifestyle modification recommendations. Discussed current diet in detail, recommend she cut down on crackers/cookies, processed foods, continue to limit animal fats, push high fiber items, discussed soluble fiber supplement.  - Lipid panel, CMP, CBC  Elevated BP without diagnosis of hypertension -  DASH diet, exercise and monitor at home. Call if greater than 130/80.    Hypothyroidism, unspecified type -     TSH Pt is extremely reluctant to take medication.  Advised if TSH is less than 3 will stop medication and monitor regularly  Atherosclerosis of aorta ([place] Per Abd CT 2014 Control blood pressure, cholesterol, glucose, increase exercise.   Vitamin D deficiency Encouraged supplement for goal range of 60-100  Smoker -     DG Chest 2 View; Future Smoking cessation-  instruction/counseling given, counseled patient on the dangers of tobacco use, advised patient to stop smoking, and reviewed strategies to maximize success, patient is gradually cutting down.  Refuses low dose CT  Medication management Continued  Abnormal Glucose Continue diet and exercise - CMP    Over 40 minutes of exam, counseling, chart review and critical decision making was performed Future Appointments  Date Time Provider [place] 04/02/2023 11:00 AM WAlycia Rossetti NP [facility] None     Plan:   During the course of the visit the patient was educated and counseled about appropriate screening and preventive services including:   Pneumococcal vaccine  Prevnar 13 Influenza  vaccine Td vaccine Screening electrocardiogram Bone densitometry screening Colorectal cancer screening Diabetes screening Glaucoma screening Nutrition counseling  Advanced directives: requested   Subjective:  [name] a 71y.o. female who presents for Medicare Annual Wellness Visit.  Bicep tear has healed no lingering issues. Strength good in that arm.   She had Covid 03/02/22- has recovered well. No lingering symptoms.     Dizziness persists, has appointment with ENT Dr. BCletis Athens12/5/22 for further evaluation- evaluation was normal. Only occurs when rolling over when lying flat.   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   she currently continues to smoke 1 pack a week; intermittently since 1979, currently 4-5 cigarettes a day, discussed risks associated with smoking. She has a 30+ pack year history, last screening was negative CXR in 02/2020 refuses Chest CT for screening will pursue another Xray     She has abdominal aortic atherosclerosis per CT 2014 She does not check BP at home; today their BP is BP: 130/72  BP Readings from Last 3 Encounters:  03/31/22 130/72  10/02/21 114/74  03/28/21 108/70  She does workout, she walks daily with her dog.  She denies chest pain, shortness of breath, dizziness.   BMI is Body mass index is 21.85 kg/m., she is working on diet and exercise. She is down 6 pounds from last visit. She has been walking 1-2 miles every day.  Wt Readings from Last 3 Encounters:  03/31/22 108 lb 3.2 oz (49.1 kg)  10/02/21 114 lb (51.7 kg)  03/28/21 112 lb 12.8 oz (51.2 kg)  She is on cholesterol medication, Rosuvastatin 5 mg QD. She is eating salads, vegetables, baked proteins, using olive oil.  Her cholesterol is at goal. The cholesterol last visit was:   Lab Results  Component Value Date   CHOL 147 10/02/2021   HDL 73 10/02/2021   LDLCALC 55 10/02/2021    LDLDIRECT 166.3 03/05/2011   TRIG 102 10/02/2021   CHOLHDL 2.0 10/02/2021    Last A1C in the office was:   Lab Results  Component Value Date   HGBA1C 5.3 10/02/2021   She was previously started on Levothyroxine 25 mcg daily last visit for elevated TSH, she is no longer taking the medication Lab Results  Component Value Date   TSH 3.65 10/02/2021    Last GFR: Lab Results  Component Value Date   EGFR 80 10/02/2021    Patient is not on Vitamin D supplement but willing to start;    Lab Results  Component Value Date   VD25OH 80 10/02/2021       Medication Review: Current Outpatient Medications on File Prior to Visit  Medication Sig Dispense Refill   docusate sodium (COLACE) 100 MG capsule Take 100 mg by mouth 2 (two) times daily.     lidocaine (XYLOCAINE) 2 % solution Use as directed 15 mLs in the mouth or throat as needed for mouth pain. 100 mL 0   rosuvastatin (CRESTOR) 5 MG tablet TAKE 1 TABLET(5 MG) BY MOUTH DAILY 90 tablet 3   Vitamin D, Ergocalciferol, (DRISDOL) 1.25 MG (50000 UNIT) CAPS capsule Take 50,000 Units by mouth daily.     zinc gluconate 50 MG tablet Take 50 mg by mouth daily.     No current facility-administered medications on file prior to visit.    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/19/2017   External hemorrhoids with complication 03/10/2011   RENAL CALCULUS 08/16/2007   Hyperlipidemia 08/09/2007    Screening Tests Immunization History  Administered Date(s) Administered   Covid-19, Mrna,Vaccine(Spikevax)12yrs and older 02/11/2022   Influenza, High Dose Seasonal PF 02/11/2018, 02/23/2020, 02/15/2022   Influenza-Unspecified  02/17/2019, 02/21/2021   PFIZER Comirnaty(Gray Top)Covid-19 Tri-Sucrose Vaccine 03/15/2020   PFIZER(Purple Top)SARS-COV-2 Vaccination 07/12/2019, 08/02/2019   Pfizer Covid-19 Vaccine Bivalent Booster 12yrs & up 01/29/2021   Pneumococcal Conjugate-13 04/30/2015   Pneumococcal Polysaccharide-23 03/31/2018   Td 08/02/1996, 03/28/2021   Tdap 03/10/2011   Tetanus:  03/28/21 Prevnar 13:  2016 Pneumonia: 2019 Influenza: 02/23/20 Shingles: interested in shingrix Covid 19: 2/2, 2021  Pap: 2016 neg HPV normal PAP declines another MGM: 02/14/20 negative repeat 1 year , plans to have done at the mobile mammography in [place] DEXA: declines  Colonoscopy: 2012, due now will call to schedule CXR 02/23/20  CT abd: 2014, abdominal aortic atherosclerosis   Names of Other Physician/Practitioners you currently use: 1. [facility] here for primary care 2. Last Eye exam:  [facility],2022 3. Last dental exam: upper dentures, partial on bottom  Patient Care Team: McKeown, William, MD as PCP - General (Internal Medicine)  SURGICAL HISTORY She  has a past surgical history that includes Vaginal hysterectomy; Knee surgery (07/2006); Breast cyst excision; Colonoscopy (nov 2011); laparoscopy (N/A, 12/10/2016); and laparotomy (  N/A, 12/10/2016). FAMILY HISTORY Her family history includes Alzheimer's disease (age of onset: 75) in her mother; Atrial fibrillation in her mother; Heart disease in her father; Hypertension in her father; Leukemia in her maternal grandmother, paternal grandfather, and paternal uncle; Multiple sclerosis in her sister; Thyroid disease in her mother.   SOCIAL HISTORY She  reports that she has been smoking cigarettes. She started smoking about 44 years ago. She has a 2.40 pack-year smoking history. She has never used smokeless tobacco. She reports that she does not drink alcohol and does not use drugs.    MEDICARE WELLNESS  OBJECTIVES: Physical activity: Current Exercise Habits: Home exercise routine, Type of exercise: Other - see comments;walking, Time (Minutes): 45, Frequency (Times/Week): 7, Weekly Exercise (Minutes/Week): 315, Intensity: Mild, Exercise limited by: None identified Cardiac risk factors: Cardiac Risk Factors include: advanced age (>55men, >65 women);dyslipidemia;family history of premature cardiovascular disease;smoking/ tobacco exposure Depression/mood screen:      03/31/2022   11:34 AM  Depression screen PHQ 2/9  Decreased Interest 0  Down, Depressed, Hopeless 0  PHQ - 2 Score 0    ADLs:     03/31/2022   11:35 AM  In your present state of health, do you have any difficulty performing the following activities:  Hearing? 0  Vision? 0  Difficulty concentrating or making decisions? 0  Walking or climbing stairs? 0  Dressing or bathing? 0  Doing errands, shopping? 0      Cognitive Testing  Alert? Yes  Normal Appearance?Yes  Oriented to person? Yes  Place? Yes   Time? Yes  Recall of three objects?  Yes  Can perform simple calculations? Yes  Displays appropriate judgment?Yes  Can read the correct time from a watch face?Yes  EOL planning: Does Patient Have a Medical Advance Directive?: No Would patient like information on creating a medical advance directive?: No - Patient declined  Review of Systems  Constitutional: Negative.  Negative for chills, fever and weight loss.  HENT: Negative.  Negative for congestion and hearing loss.   Eyes: Negative.  Negative for blurred vision and double vision.  Respiratory: Negative.  Negative for cough and shortness of breath.   Cardiovascular: Negative.  Negative for chest pain, palpitations, orthopnea and leg swelling.  Gastrointestinal: Negative.  Negative for abdominal pain, constipation, diarrhea, heartburn, nausea and vomiting.  Genitourinary: Negative.   Musculoskeletal:  Negative for falls and myalgias.  Skin: Negative.  Negative for  rash.  Neurological:  Positive for dizziness. Negative for tingling, tremors, loss of consciousness and headaches.  Endo/Heme/Allergies:  Bruises/bleeds easily.  Psychiatric/Behavioral:  Negative for depression, memory loss, substance abuse and suicidal ideas. The patient has insomnia. The patient is not nervous/anxious.      Objective:     Today's Vitals   03/31/22 1101  BP: 130/72  Pulse: 88  Temp: 97.9 F (36.6 C)  SpO2: 96%  Weight: 108 lb 3.2 oz (49.1 kg)  Height: 4' 11" (1.499 m)   Body mass index is 21.85 kg/m.  General appearance: alert, no distress, WD/WN, female HEENT: normocephalic, sclerae anicteric, TMs pearly, nares patent, no discharge or erythema, pharynx normal Oral cavity: MMM, no lesions Neck: supple, no lymphadenopathy, no thyromegaly, no masses Heart: RRR, normal S1, S2, no murmurs Lungs: CTA bilaterally, no wheezes, rhonchi, or rales Abdomen: +bs, soft, non tender, non distended, no masses, no hepatomegaly, no splenomegaly Skin: 2 cm laceration on left forearm, no signs of infection Musculoskeletal: nontender, no swelling, no obvious deformity Extremities: no edema, no cyanosis, no   clubbing Pulses: 2+ symmetric, upper and lower extremities, normal cap refill Neurological: alert, oriented x 3, CN2-12 intact, strength normal upper extremities and lower extremities, sensation normal throughout, DTRs 2+ throughout, no cerebellar signs, gait normal Psychiatric: normal affect, intermittently tearful, behavior normal, pleasant    Medicare Attestation I have personally reviewed: The patient's medical and social history Their use of alcohol, tobacco or illicit drugs Their current medications and supplements The patient's functional ability including ADLs,fall risks, home safety risks, cognitive, and hearing and visual impairment Diet and physical activities Evidence for depression or mood disorders  The patient's weight, height, BMI, and visual acuity have  been recorded in the chart.  I have made referrals, counseling, and provided education to the patient based on review of the above and I have provided the patient with a written personalized care plan for preventive services.     Alycia Rossetti, NP   03/31/2022

## 2022-03-31 ENCOUNTER — Encounter: Payer: Self-pay | Admitting: Nurse Practitioner

## 2022-03-31 ENCOUNTER — Ambulatory Visit (INDEPENDENT_AMBULATORY_CARE_PROVIDER_SITE_OTHER): Payer: PPO | Admitting: Nurse Practitioner

## 2022-03-31 VITALS — BP 130/72 | HR 88 | Temp 97.9°F | Ht 59.0 in | Wt 108.2 lb

## 2022-03-31 DIAGNOSIS — F17200 Nicotine dependence, unspecified, uncomplicated: Secondary | ICD-10-CM

## 2022-03-31 DIAGNOSIS — R6889 Other general symptoms and signs: Secondary | ICD-10-CM

## 2022-03-31 DIAGNOSIS — E559 Vitamin D deficiency, unspecified: Secondary | ICD-10-CM

## 2022-03-31 DIAGNOSIS — I700 Atherosclerosis of aorta: Secondary | ICD-10-CM

## 2022-03-31 DIAGNOSIS — Z0001 Encounter for general adult medical examination with abnormal findings: Secondary | ICD-10-CM

## 2022-03-31 DIAGNOSIS — R7309 Other abnormal glucose: Secondary | ICD-10-CM

## 2022-03-31 DIAGNOSIS — E039 Hypothyroidism, unspecified: Secondary | ICD-10-CM | POA: Diagnosis not present

## 2022-03-31 DIAGNOSIS — E782 Mixed hyperlipidemia: Secondary | ICD-10-CM

## 2022-03-31 DIAGNOSIS — Z79899 Other long term (current) drug therapy: Secondary | ICD-10-CM

## 2022-03-31 DIAGNOSIS — R030 Elevated blood-pressure reading, without diagnosis of hypertension: Secondary | ICD-10-CM | POA: Diagnosis not present

## 2022-03-31 DIAGNOSIS — Z0000 Encounter for general adult medical examination without abnormal findings: Secondary | ICD-10-CM

## 2022-03-31 DIAGNOSIS — Z1211 Encounter for screening for malignant neoplasm of colon: Secondary | ICD-10-CM

## 2022-03-31 LAB — CBC WITH DIFFERENTIAL/PLATELET
Absolute Monocytes: 547 cells/uL (ref 200–950)
Basophils Absolute: 91 cells/uL (ref 0–200)
Basophils Relative: 1.6 %
Eosinophils Absolute: 131 cells/uL (ref 15–500)
Eosinophils Relative: 2.3 %
HCT: 40.2 % (ref 35.0–45.0)
Hemoglobin: 13.2 g/dL (ref 11.7–15.5)
Lymphs Abs: 1562 cells/uL (ref 850–3900)
MCH: 30.4 pg (ref 27.0–33.0)
MCHC: 32.8 g/dL (ref 32.0–36.0)
MCV: 92.6 fL (ref 80.0–100.0)
MPV: 10.3 fL (ref 7.5–12.5)
Monocytes Relative: 9.6 %
Neutro Abs: 3369 cells/uL (ref 1500–7800)
Neutrophils Relative %: 59.1 %
Platelets: 241 10*3/uL (ref 140–400)
RBC: 4.34 10*6/uL (ref 3.80–5.10)
RDW: 12.3 % (ref 11.0–15.0)
Total Lymphocyte: 27.4 %
WBC: 5.7 10*3/uL (ref 3.8–10.8)

## 2022-03-31 LAB — COMPLETE METABOLIC PANEL WITH GFR
AG Ratio: 2.3 (calc) (ref 1.0–2.5)
ALT: 26 U/L (ref 6–29)
AST: 22 U/L (ref 10–35)
Albumin: 4.4 g/dL (ref 3.6–5.1)
Alkaline phosphatase (APISO): 82 U/L (ref 37–153)
BUN: 10 mg/dL (ref 7–25)
CO2: 29 mmol/L (ref 20–32)
Calcium: 9.1 mg/dL (ref 8.6–10.4)
Chloride: 106 mmol/L (ref 98–110)
Creat: 0.65 mg/dL (ref 0.50–1.05)
Globulin: 1.9 g/dL (calc) (ref 1.9–3.7)
Glucose, Bld: 84 mg/dL (ref 65–99)
Potassium: 4.2 mmol/L (ref 3.5–5.3)
Sodium: 142 mmol/L (ref 135–146)
Total Bilirubin: 0.4 mg/dL (ref 0.2–1.2)
Total Protein: 6.3 g/dL (ref 6.1–8.1)
eGFR: 95 mL/min/{1.73_m2} (ref >=60)

## 2022-03-31 LAB — LIPID PANEL
Cholesterol: 167 mg/dL (ref <200)
HDL: 74 mg/dL (ref >=50)
LDL Cholesterol (Calc): 74 mg/dL (calc)
Non-HDL Cholesterol (Calc): 93 mg/dL (calc) (ref <130)
Total CHOL/HDL Ratio: 2.3 (calc) (ref <5.0)
Triglycerides: 104 mg/dL (ref <150)

## 2022-03-31 LAB — TSH: TSH: 4.49 mIU/L (ref 0.40–4.50)

## 2022-04-09 ENCOUNTER — Other Ambulatory Visit: Payer: Self-pay | Admitting: Internal Medicine

## 2022-04-09 DIAGNOSIS — Z1231 Encounter for screening mammogram for malignant neoplasm of breast: Secondary | ICD-10-CM

## 2022-05-05 ENCOUNTER — Ambulatory Visit
Admission: RE | Admit: 2022-05-05 | Discharge: 2022-05-05 | Disposition: A | Discharge status: Home / Self Care | Payer: PPO | Source: Ambulatory Visit | Attending: Internal Medicine | Admitting: Internal Medicine

## 2022-05-05 DIAGNOSIS — Z1231 Encounter for screening mammogram for malignant neoplasm of breast: Secondary | ICD-10-CM

## 2022-06-12 ENCOUNTER — Other Ambulatory Visit: Payer: Self-pay

## 2022-06-12 DIAGNOSIS — E785 Hyperlipidemia, unspecified: Secondary | ICD-10-CM

## 2022-06-12 MED ORDER — ROSUVASTATIN CALCIUM 5 MG PO TABS: ORAL_TABLET | ORAL | 3 refills | Status: DC

## 2022-07-08 DIAGNOSIS — Z9049 Acquired absence of other specified parts of digestive tract: Secondary | ICD-10-CM | POA: Diagnosis not present

## 2022-07-08 DIAGNOSIS — R1310 Dysphagia, unspecified: Secondary | ICD-10-CM | POA: Diagnosis not present

## 2022-07-15 DIAGNOSIS — K222 Esophageal obstruction: Secondary | ICD-10-CM | POA: Diagnosis not present

## 2022-07-15 DIAGNOSIS — Z87440 Personal history of urinary (tract) infections: Secondary | ICD-10-CM | POA: Diagnosis not present

## 2022-07-15 DIAGNOSIS — Z86010 Personal history of colonic polyps: Secondary | ICD-10-CM | POA: Diagnosis not present

## 2022-07-15 DIAGNOSIS — K5730 Diverticulosis of large intestine without perforation or abscess without bleeding: Secondary | ICD-10-CM | POA: Diagnosis not present

## 2022-07-15 DIAGNOSIS — E785 Hyperlipidemia, unspecified: Secondary | ICD-10-CM | POA: Diagnosis not present

## 2022-07-15 DIAGNOSIS — D126 Benign neoplasm of colon, unspecified: Secondary | ICD-10-CM | POA: Diagnosis not present

## 2022-07-15 DIAGNOSIS — R1310 Dysphagia, unspecified: Secondary | ICD-10-CM | POA: Diagnosis not present

## 2022-07-15 DIAGNOSIS — K635 Polyp of colon: Secondary | ICD-10-CM | POA: Diagnosis not present

## 2022-07-15 DIAGNOSIS — Z1211 Encounter for screening for malignant neoplasm of colon: Secondary | ICD-10-CM | POA: Diagnosis not present

## 2022-07-15 DIAGNOSIS — K449 Diaphragmatic hernia without obstruction or gangrene: Secondary | ICD-10-CM | POA: Diagnosis not present

## 2022-07-15 DIAGNOSIS — K644 Residual hemorrhoidal skin tags: Secondary | ICD-10-CM | POA: Diagnosis not present

## 2022-07-15 DIAGNOSIS — K648 Other hemorrhoids: Secondary | ICD-10-CM | POA: Diagnosis not present

## 2022-07-15 LAB — HM COLONOSCOPY

## 2022-08-03 IMAGING — CR DG SHOULDER 2+V*R*
3 series · 3 of 3 positions shown · non-contrast
Comparison: None.

CLINICAL DATA: Pain weakness

EXAM:
RIGHT SHOULDER - 2+ VIEW

[w shoulder grashey right]
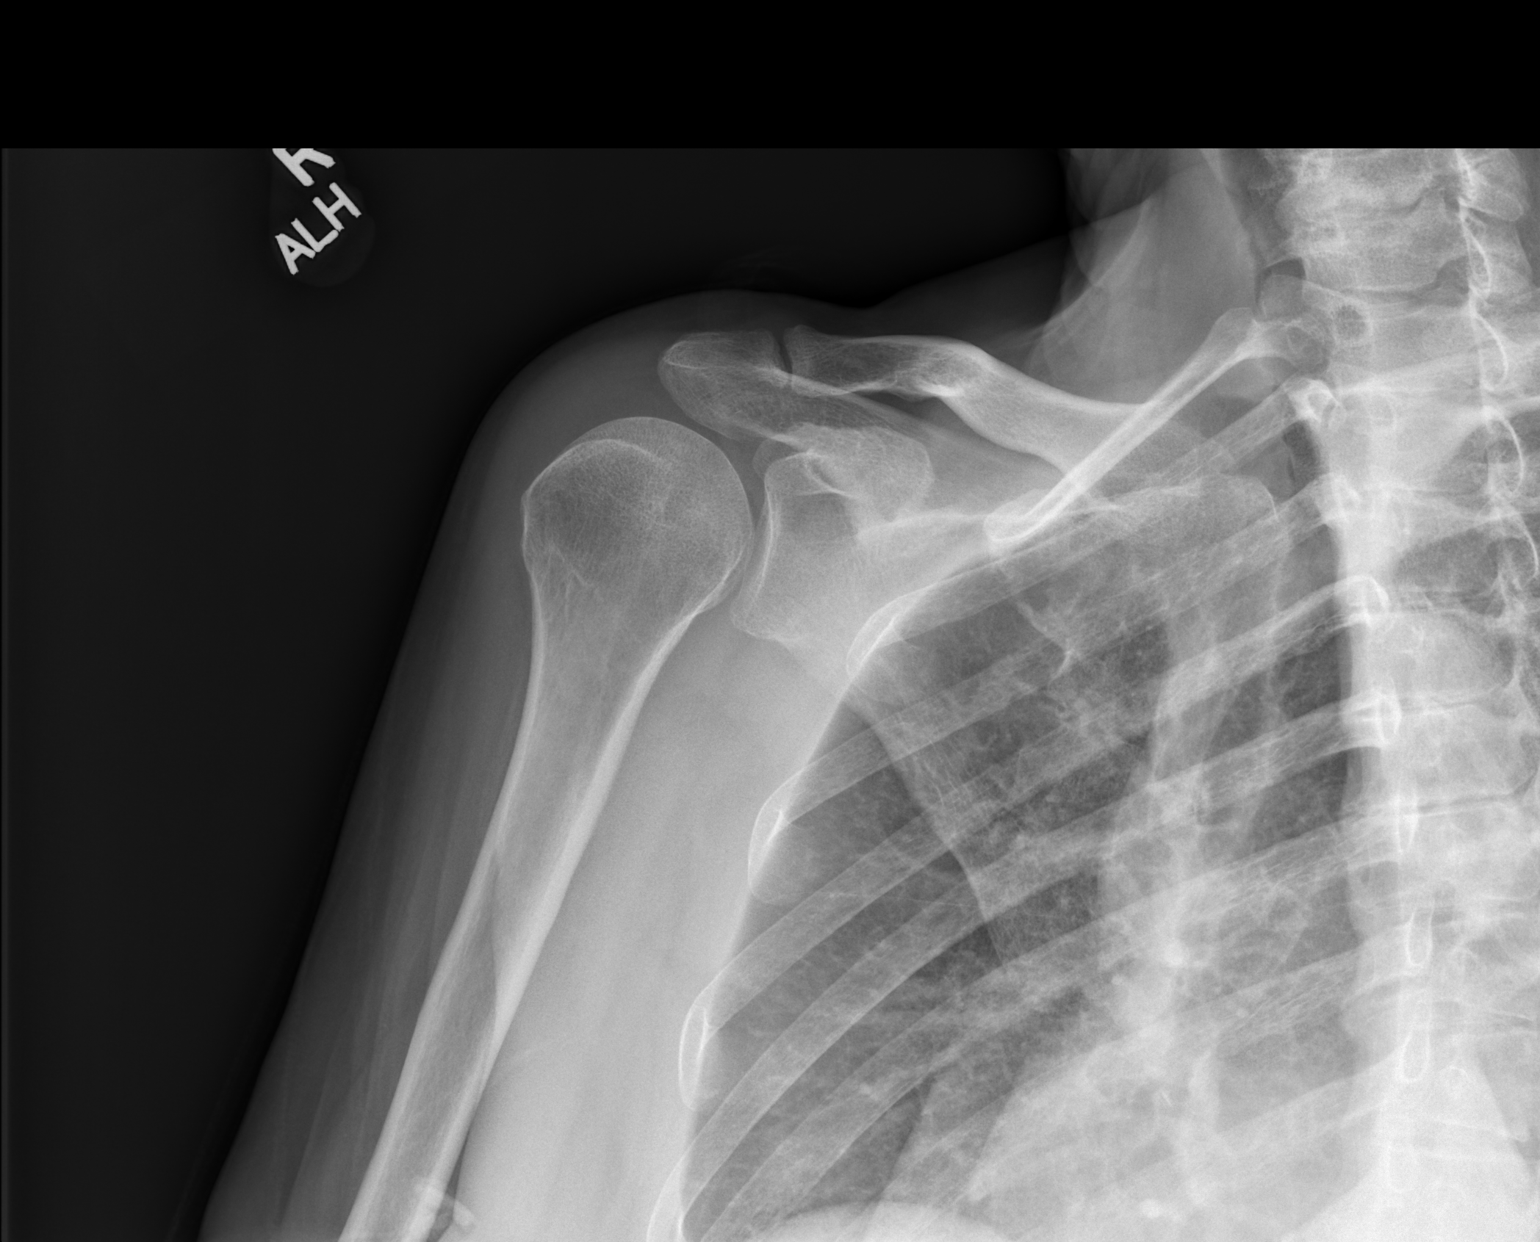

[w shoulder y-view right]
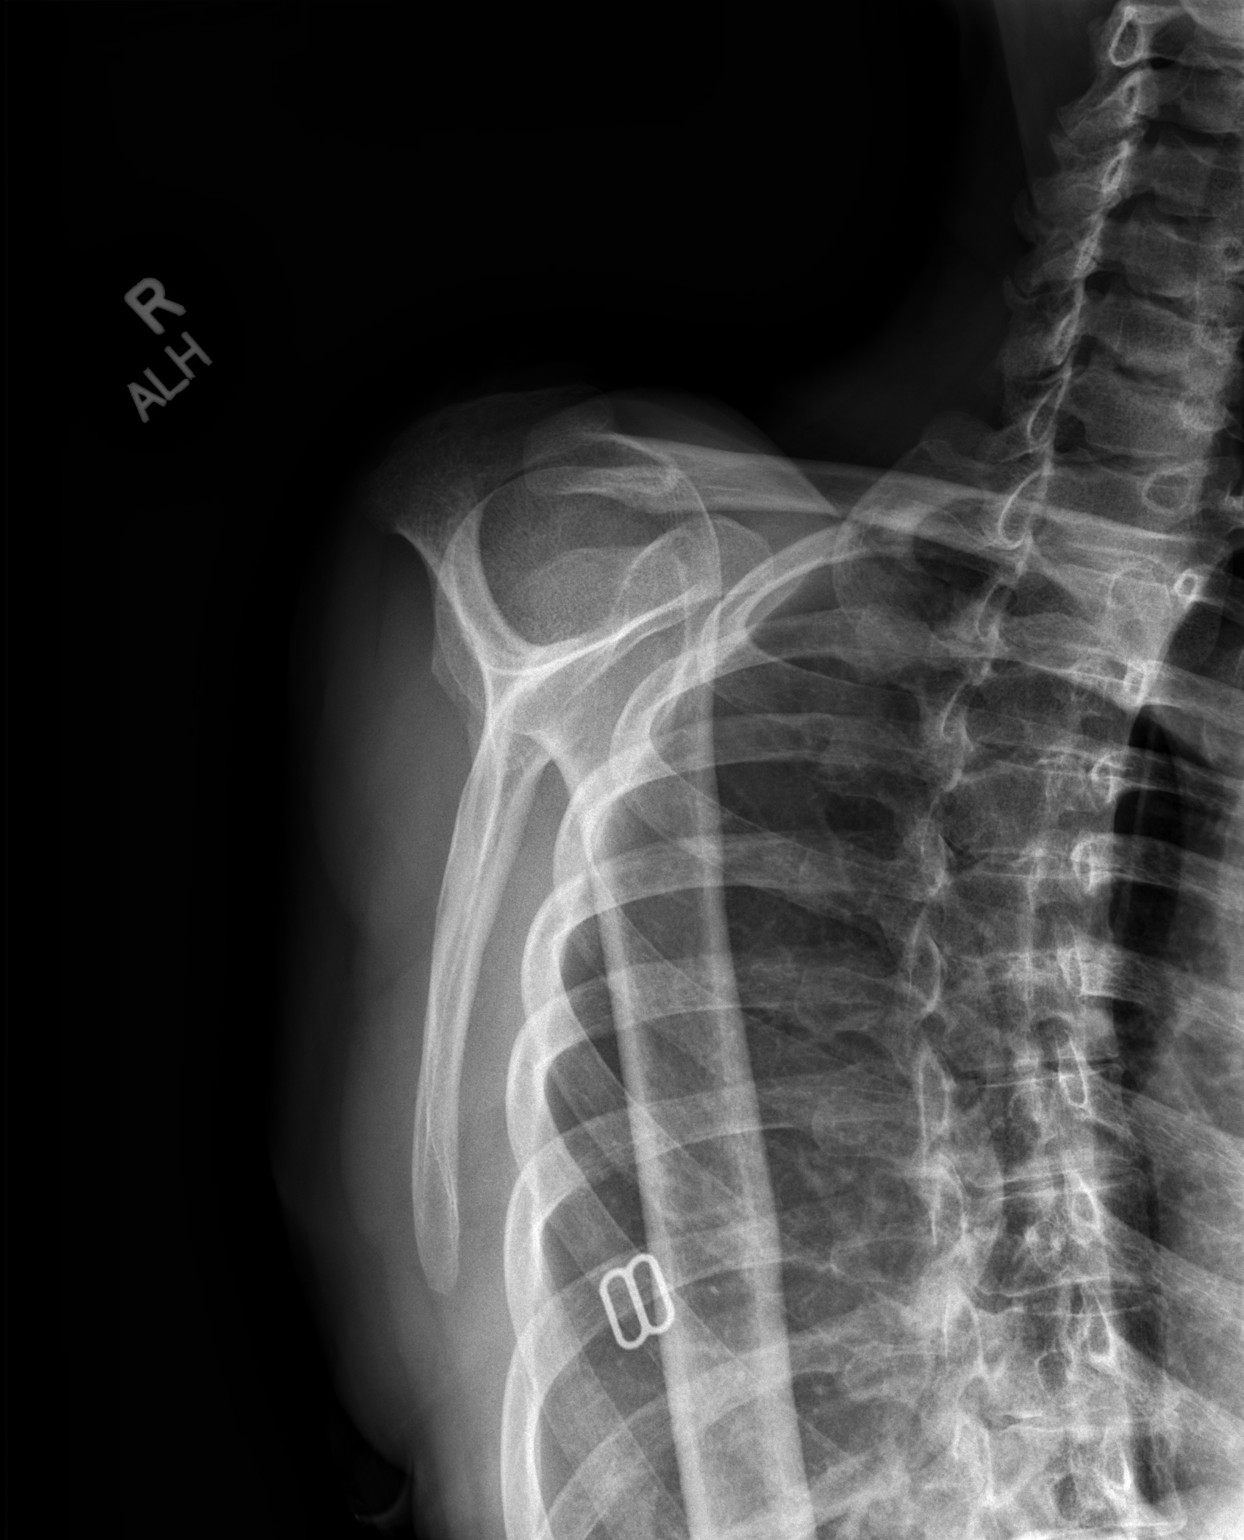

[x shoulder axillary right]
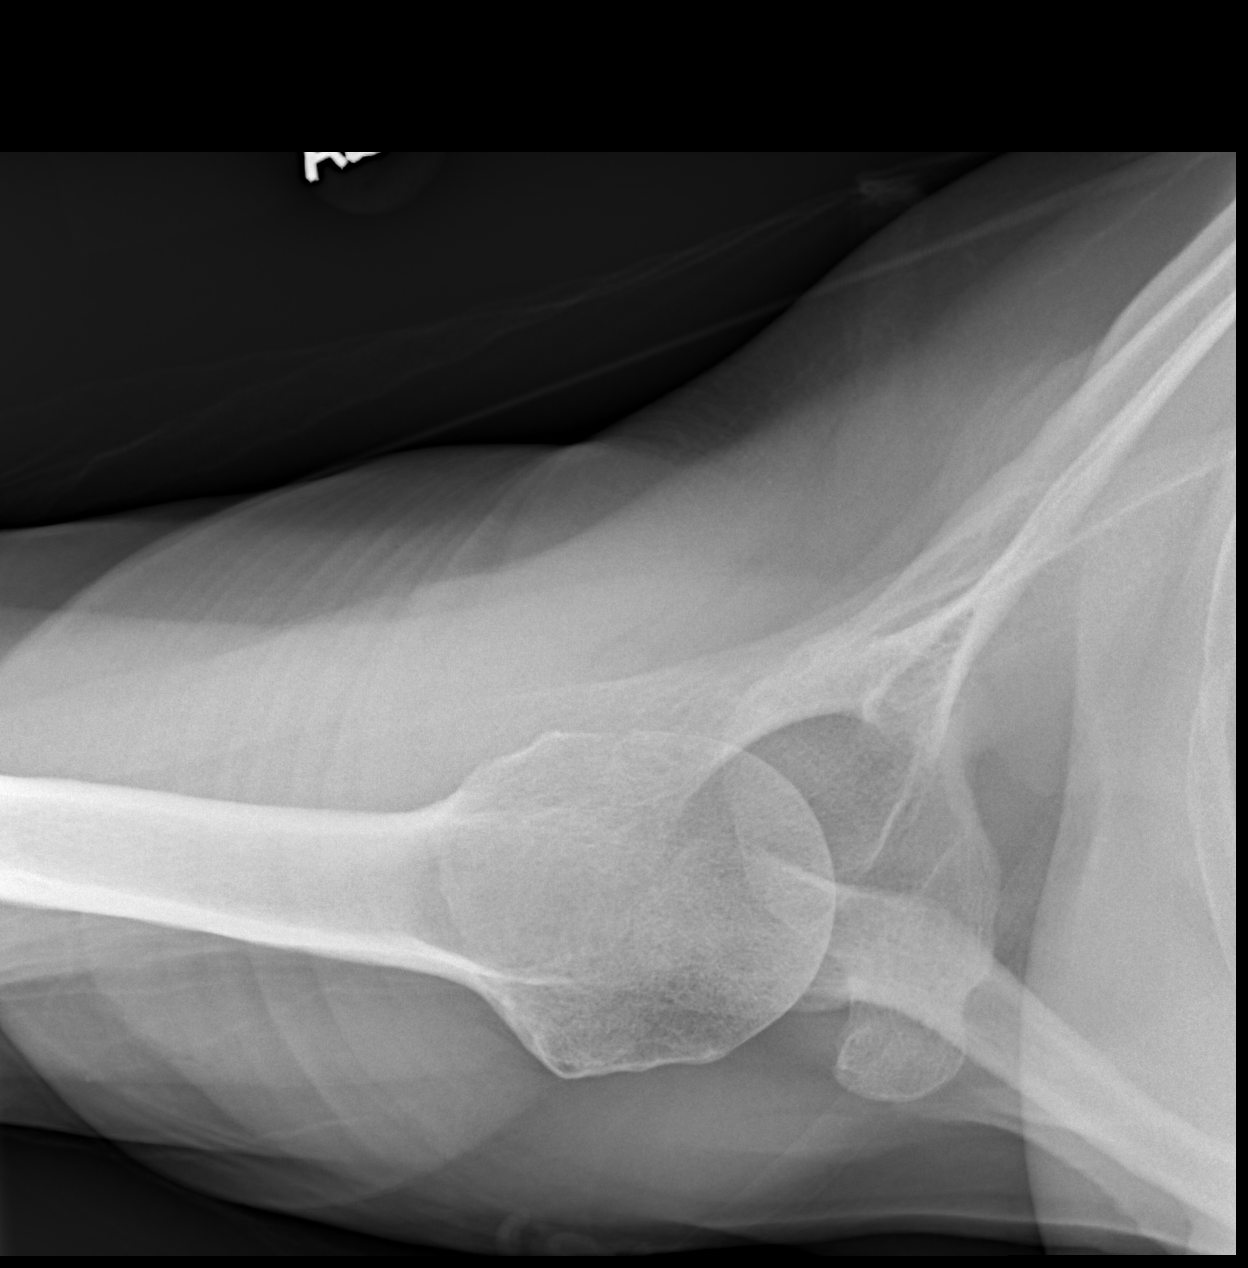

[3 of 3 positions shown; findings below may reference images not displayed]

FINDINGS: There is no evidence of fracture or dislocation. There is no
evidence of arthropathy or other focal bone abnormality. Soft
tissues are unremarkable.
IMPRESSION: Negative.

## 2022-09-08 DIAGNOSIS — K5790 Diverticulosis of intestine, part unspecified, without perforation or abscess without bleeding: Secondary | ICD-10-CM | POA: Diagnosis not present

## 2022-09-08 DIAGNOSIS — K649 Unspecified hemorrhoids: Secondary | ICD-10-CM | POA: Diagnosis not present

## 2022-09-24 NOTE — Progress Notes (Unsigned)
Complete Physical Exam  Assessment:    [name] was seen today for follow-up.  Diagnoses and all orders for this visit:  Encounter for General Adult Medical Exam with Abnormal Findings  Due Annually  Health Maintenance Reviewed  Healthy lifestyle reviewed and goals set  Hyperlipidemia, unspecified hyperlipidemia type Has increased Crestor 5mg  to daily but has not been consistent with taking medication. Receptive to lifestyle modification recommendations. Discussed current diet in detail, recommend she cut down on crackers/cookies, processed foods, continue to limit animal fats, push high fiber items, discussed soluble fiber supplement.  - Lipid panel, CMP, CBC  Elevated BP without diagnosis of hypertension -  DASH diet, exercise and monitor at home. Call if greater than 130/80.    Hypothyroidism, unspecified type -     TSH Pt is extremely reluctant to take medication.  Advised if TSH is less than 3 will stop medication and monitor regularly  Atherosclerosis of aorta (HCC) Per Abd CT 2014 Control blood pressure, cholesterol, glucose, increase exercise.   Vitamin D deficiency Encouraged supplement for goal range of 60-100  Smoker -     DG Chest 2 View; Future Smoking cessation-  instruction/counseling given, counseled patient on the dangers of tobacco use, advised patient to stop smoking, and reviewed strategies to maximize success, patient is gradually cutting down.  Refuses low dose CT  Medication management Continued  Abnormal Glucose Continue diet and exercise - CMP    Over 40 minutes of exam, counseling, chart review and critical decision making was performed Future Appointments  Date Time Provider Department Center  09/25/2022 10:00 AM Raynelle Dick, NP [facility] None  04/02/2023 11:00 AM Raynelle Dick, NP [facility] None     Plan:   During the course of the visit the patient was educated and counseled about appropriate screening and preventive  services including:   Pneumococcal vaccine  Prevnar 13 Influenza vaccine Td vaccine Screening electrocardiogram Bone densitometry screening Colorectal cancer screening Diabetes screening Glaucoma screening Nutrition counseling  Advanced directives: requested   Subjective:  [name] is a 72 y.o. female who presents for Medicare Annual Wellness Visit.  Bicep tear has healed no lingering issues. Strength good in that arm.   She had Covid 03/02/22- has recovered well. No lingering symptoms.     Dizziness persists, has appointment with ENT Dr. Maudie Flakes 04/22/21 for further evaluation- evaluation was normal. Only occurs when rolling over when lying flat.   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   she currently continues to smoke 1 pack a week; intermittently since 1979, currently 4-5 cigarettes a day, discussed risks associated with smoking. She has a 30+ pack year history, last screening was negative CXR in 02/2020 refuses Chest CT for screening will pursue another Xray     She has abdominal aortic atherosclerosis per CT 2014 She does not check BP at home; today their BP is    BP Readings from Last 3 Encounters:  03/31/22 130/72  10/02/21 114/74  03/28/21 108/70  She does workout, she walks daily with her dog.  She denies chest pain, shortness of breath, dizziness.   BMI is There is no height or weight on file to calculate BMI., she is working on diet and exercise. She is down 6 pounds from last visit. She has been walking 1-2 miles every day.  Wt Readings from Last 3 Encounters:  03/31/22 108 lb 3.2 oz (  49.1 kg)  10/02/21 114 lb (51.7 kg)  03/28/21 112 lb 12.8 oz (51.2 kg)   She is on cholesterol medication, Rosuvastatin 5 mg QD. She is eating salads, vegetables, baked proteins, using olive oil.  Her cholesterol is at goal. The cholesterol last visit was:   Lab Results  Component  Value Date   CHOL 167 03/31/2022   HDL 74 03/31/2022   LDLCALC 74 03/31/2022   LDLDIRECT 166.3 03/05/2011   TRIG 104 03/31/2022   CHOLHDL 2.3 03/31/2022    Last A1C in the office was:   Lab Results  Component Value Date   HGBA1C 5.3 10/02/2021   She was previously started on Levothyroxine 25 mcg daily last visit for elevated TSH, she is no longer taking the medication Lab Results  Component Value Date   TSH 4.49 03/31/2022    Last GFR: Lab Results  Component Value Date   EGFR 95 03/31/2022    Patient is not on Vitamin D supplement but willing to start;    Lab Results  Component Value Date   VD25OH 80 10/02/2021       Medication Review: Current Outpatient Medications on File Prior to Visit  Medication Sig Dispense Refill   docusate sodium (COLACE) 100 MG capsule Take 100 mg by mouth 2 (two) times daily.     lidocaine (XYLOCAINE) 2 % solution Use as directed 15 mLs in the mouth or throat as needed for mouth pain. 100 mL 0   rosuvastatin (CRESTOR) 5 MG tablet TAKE 1 TABLET(5 MG) BY MOUTH DAILY 90 tablet 3   Vitamin D, Ergocalciferol, (DRISDOL) 1.25 MG (50000 UNIT) CAPS capsule Take 50,000 Units by mouth daily.     zinc gluconate 50 MG tablet Take 50 mg by mouth daily.     No current facility-administered medications on file prior to visit.    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/19/2017   External hemorrhoids with complication 03/10/2011   RENAL CALCULUS 08/16/2007   Hyperlipidemia 08/09/2007    Screening Tests Immunization History  Administered Date(s) Administered   Covid-19, Mrna,Vaccine(Spikevax)72yrs and older 02/11/2022   Influenza,  High Dose Seasonal PF 02/11/2018, 02/23/2020, 02/15/2022   Influenza-Unspecified 02/17/2019, 02/21/2021   PFIZER Comirnaty(Gray Top)Covid-19 Tri-Sucrose Vaccine 03/15/2020   PFIZER(Purple Top)SARS-COV-2 Vaccination 07/12/2019, 08/02/2019   Pfizer Covid-19 Vaccine Bivalent Booster 44yrs & up 01/29/2021   Pneumococcal Conjugate-13 04/30/2015   Pneumococcal Polysaccharide-23 03/31/2018   Td 08/02/1996, 03/28/2021   Tdap 03/10/2011   Tetanus:  03/28/21 Prevnar 13:  2016 Pneumonia: 2019 Influenza: 02/23/20 Shingles: interested in shingrix Covid 19: 2/2, 2021  Pap: 2016 neg HPV normal PAP declines another MGM: 02/14/20 negative repeat 1 year , plans to have done at the mobile mamm[facility] in [place] DEXA: declines  Colonoscopy: 2012, due now will call to schedule CXR 02/23/20  CT abd: 2014, abdominal aortic atherosclerosis   Names of Other Physician/Practitioners you currently use: 1. [facility] here for primary care 2. Last Eye exam:  [facility],2022 3. Last dental exam: upper dentures, partial on bottom  Patient Care Team: Lucky Cowboy, MD as PCP - General (Internal Medicine)  SURGICAL HISTORY She  has a past surgical history  that includes Vaginal hysterectomy; Knee surgery (07/2006); Breast cyst excision; Colonoscopy (nov 2011); laparoscopy (N/A, 12/10/2016); and laparotomy (N/A, 12/10/2016). FAMILY HISTORY Her family history includes Alzheimer's disease (age of onset: 11) in her mother; Atrial fibrillation in her mother; Heart disease in her father; Hypertension in her father; Leukemia in her maternal grandmother, paternal grandfather, and paternal uncle; Multiple sclerosis in her sister; Thyroid disease in her mother.   SOCIAL HISTORY She  reports that she has been smoking cigarettes. She started smoking about 45 years ago. She has a 2.40 pack-year smoking history. She has never used smokeless tobacco. She reports that she does  not drink alcohol and does not use drugs.    MEDICARE WELLNESS OBJECTIVES: Physical activity:   Cardiac risk factors:   Depression/mood screen:      03/31/2022   11:34 AM  Depression screen PHQ 2/9  Decreased Interest 0  Down, Depressed, Hopeless 0  PHQ - 2 Score 0    ADLs:     03/31/2022   11:35 AM  In your present state of health, do you have any difficulty performing the following activities:  Hearing? 0  Vision? 0  Difficulty concentrating or making decisions? 0  Walking or climbing stairs? 0  Dressing or bathing? 0  Doing errands, shopping? 0      Cognitive Testing  Alert? Yes  Normal Appearance?Yes  Oriented to person? Yes  Place? Yes   Time? Yes  Recall of three objects?  Yes  Can perform simple calculations? Yes  Displays appropriate judgment?Yes  Can read the correct time from a watch face?Yes  EOL planning:    Review of Systems  Constitutional: Negative.  Negative for chills, fever and weight loss.  HENT: Negative.  Negative for congestion and hearing loss.   Eyes: Negative.  Negative for blurred vision and double vision.  Respiratory: Negative.  Negative for cough and shortness of breath.   Cardiovascular: Negative.  Negative for chest pain, palpitations, orthopnea and leg swelling.  Gastrointestinal: Negative.  Negative for abdominal pain, constipation, diarrhea, heartburn, nausea and vomiting.  Genitourinary: Negative.   Musculoskeletal:  Negative for falls and myalgias.  Skin: Negative.  Negative for rash.  Neurological:  Positive for dizziness. Negative for tingling, tremors, loss of consciousness and headaches.  Endo/Heme/Allergies:  Bruises/bleeds easily.  Psychiatric/Behavioral:  Negative for depression, memory loss, substance abuse and suicidal ideas. The patient has insomnia. The patient is not nervous/anxious.      Objective:     There were no vitals filed for this visit.  There is no height or weight on file to calculate  BMI.  General appearance: alert, no distress, WD/WN, female HEENT: normocephalic, sclerae anicteric, TMs pearly, nares patent, no discharge or erythema, pharynx normal Oral cavity: MMM, no lesions Neck: supple, no lymphadenopathy, no thyromegaly, no masses Heart: RRR, normal S1, S2, no murmurs Lungs: CTA bilaterally, no wheezes, rhonchi, or rales Abdomen: +bs, soft, non tender, non distended, no masses, no hepatomegaly, no splenomegaly Skin: 2 cm laceration on left forearm, no signs of infection Musculoskeletal: nontender, no swelling, no obvious deformity Extremities: no edema, no cyanosis, no clubbing Pulses: 2+ symmetric, upper and lower extremities, normal cap refill Neurological: alert, oriented x 3, CN2-12 intact, strength normal upper extremities and lower extremities, sensation normal throughout, DTRs 2+ throughout, no cerebellar signs, gait normal Psychiatric: normal affect, intermittently tearful, behavior normal, pleasant    Medicare Attestation I have personally reviewed: The patient's medical and social history Their use of alcohol, tobacco or illicit drugs  Their current medications and supplements The patient's functional ability including ADLs,fall risks, home safety risks, cognitive, and hearing and visual impairment Diet and physical activities Evidence for depression or mood disorders  The patient's weight, height, BMI, and visual acuity have been recorded in the chart.  I have made referrals, counseling, and provided education to the patient based on review of the above and I have provided the patient with a written personalized care plan for preventive services.     Raynelle Dick, NP   09/24/2022

## 2022-09-25 ENCOUNTER — Ambulatory Visit (INDEPENDENT_AMBULATORY_CARE_PROVIDER_SITE_OTHER): Payer: PPO | Admitting: Nurse Practitioner

## 2022-09-25 ENCOUNTER — Encounter: Payer: Self-pay | Admitting: Internal Medicine

## 2022-09-25 ENCOUNTER — Encounter: Payer: Self-pay | Admitting: Nurse Practitioner

## 2022-09-25 VITALS — BP 110/68 | HR 66 | Temp 97.5°F | Ht 59.0 in | Wt 110.6 lb

## 2022-09-25 DIAGNOSIS — R7309 Other abnormal glucose: Secondary | ICD-10-CM

## 2022-09-25 DIAGNOSIS — K449 Diaphragmatic hernia without obstruction or gangrene: Secondary | ICD-10-CM

## 2022-09-25 DIAGNOSIS — R030 Elevated blood-pressure reading, without diagnosis of hypertension: Secondary | ICD-10-CM | POA: Diagnosis not present

## 2022-09-25 DIAGNOSIS — Z0000 Encounter for general adult medical examination without abnormal findings: Secondary | ICD-10-CM | POA: Diagnosis not present

## 2022-09-25 DIAGNOSIS — I700 Atherosclerosis of aorta: Secondary | ICD-10-CM | POA: Diagnosis not present

## 2022-09-25 DIAGNOSIS — Z136 Encounter for screening for cardiovascular disorders: Secondary | ICD-10-CM | POA: Diagnosis not present

## 2022-09-25 DIAGNOSIS — Z79899 Other long term (current) drug therapy: Secondary | ICD-10-CM | POA: Diagnosis not present

## 2022-09-25 DIAGNOSIS — Z87891 Personal history of nicotine dependence: Secondary | ICD-10-CM

## 2022-09-25 DIAGNOSIS — E559 Vitamin D deficiency, unspecified: Secondary | ICD-10-CM | POA: Diagnosis not present

## 2022-09-25 DIAGNOSIS — Z0001 Encounter for general adult medical examination with abnormal findings: Secondary | ICD-10-CM

## 2022-09-25 DIAGNOSIS — E782 Mixed hyperlipidemia: Secondary | ICD-10-CM

## 2022-09-25 DIAGNOSIS — E039 Hypothyroidism, unspecified: Secondary | ICD-10-CM | POA: Diagnosis not present

## 2022-09-25 DIAGNOSIS — F17200 Nicotine dependence, unspecified, uncomplicated: Secondary | ICD-10-CM

## 2022-09-25 DIAGNOSIS — F4321 Adjustment disorder with depressed mood: Secondary | ICD-10-CM

## 2022-09-25 DIAGNOSIS — Z1389 Encounter for screening for other disorder: Secondary | ICD-10-CM

## 2022-09-25 MED ORDER — TRAZODONE HCL 50 MG PO TABS
ORAL_TABLET | ORAL | 1 refills | Status: DC
Start: 2022-09-25 — End: 2023-03-23

## 2022-09-25 NOTE — Patient Instructions (Signed)

## 2022-09-26 LAB — COMPLETE METABOLIC PANEL WITH GFR
AG Ratio: 2.2 (calc) (ref 1.0–2.5)
ALT: 15 U/L (ref 6–29)
AST: 18 U/L (ref 10–35)
Albumin: 4.6 g/dL (ref 3.6–5.1)
Alkaline phosphatase (APISO): 82 U/L (ref 37–153)
BUN: 16 mg/dL (ref 7–25)
CO2: 27 mmol/L (ref 20–32)
Calcium: 9.3 mg/dL (ref 8.6–10.4)
Chloride: 107 mmol/L (ref 98–110)
Creat: 0.62 mg/dL (ref 0.50–1.05)
Globulin: 2.1 g/dL (calc) (ref 1.9–3.7)
Glucose, Bld: 76 mg/dL (ref 65–99)
Potassium: 4.7 mmol/L (ref 3.5–5.3)
Sodium: 143 mmol/L (ref 135–146)
Total Bilirubin: 0.4 mg/dL (ref 0.2–1.2)
Total Protein: 6.7 g/dL (ref 6.1–8.1)
eGFR: 96 mL/min/{1.73_m2} (ref >=60)

## 2022-09-26 LAB — URINALYSIS, ROUTINE W REFLEX MICROSCOPIC
Bilirubin Urine: NEGATIVE
Glucose, UA: NEGATIVE
Hgb urine dipstick: NEGATIVE
Ketones, ur: NEGATIVE
Leukocytes,Ua: NEGATIVE
Nitrite: NEGATIVE
Protein, ur: NEGATIVE
Specific Gravity, Urine: 1.016 (ref 1.001–1.035)
pH: <=5 (ref 5.0–8.0)

## 2022-09-26 LAB — CBC WITH DIFFERENTIAL/PLATELET
Absolute Monocytes: 536 cells/uL (ref 200–950)
Basophils Absolute: 91 cells/uL (ref 0–200)
Basophils Relative: 1.6 %
Eosinophils Absolute: 80 cells/uL (ref 15–500)
Eosinophils Relative: 1.4 %
HCT: 41.1 % (ref 35.0–45.0)
Hemoglobin: 13.3 g/dL (ref 11.7–15.5)
Lymphs Abs: 1311 cells/uL (ref 850–3900)
MCH: 29.9 pg (ref 27.0–33.0)
MCHC: 32.4 g/dL (ref 32.0–36.0)
MCV: 92.4 fL (ref 80.0–100.0)
MPV: 10.8 fL (ref 7.5–12.5)
Monocytes Relative: 9.4 %
Neutro Abs: 3682 cells/uL (ref 1500–7800)
Neutrophils Relative %: 64.6 %
Platelets: 257 10*3/uL (ref 140–400)
RBC: 4.45 10*6/uL (ref 3.80–5.10)
RDW: 11.8 % (ref 11.0–15.0)
Total Lymphocyte: 23 %
WBC: 5.7 10*3/uL (ref 3.8–10.8)

## 2022-09-26 LAB — MICROALBUMIN / CREATININE URINE RATIO
Creatinine, Urine: 67 mg/dL (ref 20–275)
Microalb Creat Ratio: 6 mg/g creat (ref <30)
Microalb, Ur: 0.4 mg/dL

## 2022-09-26 LAB — LIPID PANEL
Cholesterol: 195 mg/dL (ref <200)
HDL: 98 mg/dL (ref >=50)
LDL Cholesterol (Calc): 80 mg/dL (calc)
Non-HDL Cholesterol (Calc): 97 mg/dL (calc) (ref <130)
Total CHOL/HDL Ratio: 2 (calc) (ref <5.0)
Triglycerides: 83 mg/dL (ref <150)

## 2022-09-26 LAB — MAGNESIUM: Magnesium: 2.2 mg/dL (ref 1.5–2.5)

## 2022-09-26 LAB — VITAMIN D 25 HYDROXY (VIT D DEFICIENCY, FRACTURES): Vit D, 25-Hydroxy: 66 ng/mL (ref 30–100)

## 2022-09-26 LAB — HEMOGLOBIN A1C
Hgb A1c MFr Bld: 5.4 % of total Hgb (ref <5.7)
Mean Plasma Glucose: 108 mg/dL
eAG (mmol/L): 6 mmol/L

## 2022-09-26 LAB — TSH: TSH: 2.62 mIU/L (ref 0.40–4.50)

## 2022-10-03 DIAGNOSIS — R928 Other abnormal and inconclusive findings on diagnostic imaging of breast: Secondary | ICD-10-CM | POA: Diagnosis not present

## 2022-10-03 DIAGNOSIS — Z87891 Personal history of nicotine dependence: Secondary | ICD-10-CM | POA: Diagnosis not present

## 2022-10-08 ENCOUNTER — Telehealth: Payer: Self-pay

## 2022-10-08 ENCOUNTER — Other Ambulatory Visit: Payer: Self-pay | Admitting: Nurse Practitioner

## 2022-10-08 DIAGNOSIS — R9389 Abnormal findings on diagnostic imaging of other specified body structures: Secondary | ICD-10-CM

## 2022-10-08 NOTE — Telephone Encounter (Signed)
Patient aware of chest x-ray results. Annabelle Harman recommends another bilateral mammogram due to findings. Patient is going to call her insurance to see if they will pay for another mammogram and then [facility].

## 2022-12-04 ENCOUNTER — Encounter: Payer: Self-pay | Admitting: Internal Medicine

## 2023-01-05 DIAGNOSIS — L578 Other skin changes due to chronic exposure to nonionizing radiation: Secondary | ICD-10-CM | POA: Diagnosis not present

## 2023-01-05 DIAGNOSIS — D220 Melanocytic nevi of lip: Secondary | ICD-10-CM | POA: Diagnosis not present

## 2023-01-05 DIAGNOSIS — L570 Actinic keratosis: Secondary | ICD-10-CM | POA: Diagnosis not present

## 2023-01-05 DIAGNOSIS — L814 Other melanin hyperpigmentation: Secondary | ICD-10-CM | POA: Diagnosis not present

## 2023-01-05 DIAGNOSIS — L821 Other seborrheic keratosis: Secondary | ICD-10-CM | POA: Diagnosis not present

## 2023-03-22 ENCOUNTER — Other Ambulatory Visit: Payer: Self-pay | Admitting: Nurse Practitioner

## 2023-03-22 DIAGNOSIS — F4321 Adjustment disorder with depressed mood: Secondary | ICD-10-CM

## 2023-03-26 ENCOUNTER — Other Ambulatory Visit: Payer: Self-pay | Admitting: Internal Medicine

## 2023-03-26 DIAGNOSIS — Z1231 Encounter for screening mammogram for malignant neoplasm of breast: Secondary | ICD-10-CM

## 2023-04-01 NOTE — Progress Notes (Unsigned)
MEDICARE ANNUAL WELLNESS VISIT AND FOLLOW UP  Assessment:    [name] was seen today for follow-up.  Diagnoses and all orders for this visit:  Encounter for Medicare annual wellness exam  Due Annually  Health Maintenance Reviewed  Healthy lifestyle reviewed and goals set Mammogram 05/05/22- negative scheduled for 05/26/23  Hyperlipidemia, unspecified hyperlipidemia type Decrease Rosuvastatin 5 mg to 3 days a week and determine if leg pain resolves Receptive to lifestyle modification recommendations. Discussed current diet in detail, recommend she cut down on crackers/cookies, processed foods, continue to limit animal fats, push high fiber items, discussed soluble fiber supplement.  - Lipid panel, CMP, CBC  Elevated BP without diagnosis of hypertension -  DASH diet, exercise and monitor at home. Call if greater than 130/80.    Hypothyroidism, unspecified type -     TSH Pt is extremely reluctant to take medication.  Advised if TSH is less than 3 will stop medication and monitor regularly  Atherosclerosis of aorta (HCC) Per Abd CT 2014 Control blood pressure, cholesterol, glucose, increase exercise.   Vitamin D deficiency Encouraged supplement for goal range of 60-100  Former Smoker Quit smoking in January Chest xray 09/2022 normal lungs Refuses low dose CT  Abnormal Glucose Continue diet and exercise - CMP  Medication management -     CBC with Differential/Platelet -     COMPLETE METABOLIC PANEL WITH GFR -     Magnesium -     Lipid panel -     TSH  Left elbow pain Pain on lateral epicondyle, appears enlarged -     DG Elbow Complete Left; Future  Estrogen deficiency -     DG Bone Density; Future  Bony abnormality of skull Use Tylenol for headaches Will treat further pending CT results -     CT HEAD WO CONTRAST ( ); Future      Over 40 minutes of exam, counseling, chart review and critical decision making was performed Future Appointments  Date Time  Provider Department Center  05/27/2023 11:30 AM [facility]  09/25/2023 10:00 AM Raynelle Dick, NP [facility] None  04/01/2024 10:30 AM Raynelle Dick, NP [facility] None     Plan:   During the course of the visit the patient was educated and counseled about appropriate screening and preventive services including:   Pneumococcal vaccine  Prevnar 13 Influenza vaccine Td vaccine Screening electrocardiogram Bone densitometry screening Colorectal cancer screening Diabetes screening Glaucoma screening Nutrition counseling  Advanced directives: requested   Subjective:  [name] is a 72 y.o. female who presents for Medicare Annual Wellness Visit and 3 month follow up. has Hyperlipidemia, mixed; RENAL CALCULUS; External hemorrhoids with complication; Personal history of benign carcinoid tumor; Atherosclerosis of aorta (HCC); HSV-1 infection; Insomnia secondary to situational depression; Vitamin D deficiency; B12 deficiency; Smoker; Abnormal glucose; Hypothyroidism; Elevated BP without diagnosis of hypertension; and Former smoker on their problem list.    She has been having pain in lower legs started 2 months ago - has tried Tylenol . Will occasionally help.  No injuries remembered.   She has headaches above left eye- occur intermittently- Tylenol does help. Denies blurred vision, spots in vision, sensitivity to light and sound  She has a [employer]- goes everywhere with her   She is retired from [employer] u[employer] and she is enjoying it.   Started smoking intermittently since 1979, quit smoking 05/2022 She has a 30+ pack year history, last screening was negative CXR in  09/2022 was negative, refuses Chest CT for screening      She has abdominal aortic atherosclerosis per CT 2014 She does not check BP at home; BP controlled without medication, today their BP is BP: 120/70  BP Readings from Last 3 Encounters:  04/02/23 120/70   09/25/22 110/68  03/31/22 130/72  She does workout, she walks daily with her dog.  She denies chest pain, shortness of breath, dizziness.   BMI is Body mass index is 22.86 kg/m., she is working on diet and exercise. She is down 6 pounds from last visit. She has been walking 1-2 miles every day.  Wt Readings from Last 3 Encounters:  04/02/23 113 lb 3.2 oz (51.3 kg)  09/25/22 110 lb 9.6 oz (50.2 kg)  03/31/22 108 lb 3.2 oz (49.1 kg)   She is on cholesterol medication, Rosuvastatin 5 mg every day, having lower leg pain. She is eating salads, vegetables, baked proteins, using olive oil.  Her cholesterol is at goal. The cholesterol last visit was:   Lab Results  Component Value Date   CHOL 195 09/25/2022   HDL 98 09/25/2022   LDLCALC 80 09/25/2022   LDLDIRECT 166.3 03/05/2011   TRIG 83 09/25/2022   CHOLHDL 2.0 09/25/2022    Last A1C in the office was:   Lab Results  Component Value Date   HGBA1C 5.4 09/25/2022   She was previously taking Levothyroxine 25 mcg daily ,  she is no longer taking the medication and last TSH was in normal range Lab Results  Component Value Date   TSH 2.62 09/25/2022    Last GFR: Lab Results  Component Value Date   EGFR 96 09/25/2022    Patient is not on Vitamin D supplement but willing to start;    Lab Results  Component Value Date   VD25OH 66 09/25/2022       Medication Review: Current Outpatient Medications on File Prior to Visit  Medication Sig Dispense Refill   Acetaminophen (TYLENOL PO) Take by mouth.     Cholecalciferol (VITAMIN D) 125 MCG (5000 UT) CAPS Take by mouth. 2 times a week     docusate sodium (COLACE) 100 MG capsule Take 100 mg by mouth 2 (two) times daily.     lidocaine (XYLOCAINE) 2 % solution Use as directed 15 mLs in the mouth or throat as needed for mouth pain. 100 mL 0   omeprazole (PRILOSEC) 20 MG capsule Take 20 mg by mouth daily.     rosuvastatin (CRESTOR) 5 MG tablet TAKE 1 TABLET(5 MG) BY MOUTH DAILY 90 tablet 3    traZODone (DESYREL) 50 MG tablet TAKE 1 TO 1 AND 1/2 TABLETS BY MOUTH DAILY FOR SLEEP 135 tablet 1   zinc gluconate 50 MG tablet Take 50 mg by mouth daily.     VITAMIN E PO Take by mouth. Every other day (Patient not taking: Reported on 04/02/2023)     No current facility-administered medications on file prior to visit.    No Known Allergies  Current Problems (verified) Patient Active Problem List   Diagnosis Date Noted   Abnormal glucose 04/02/2023   Hypothyroidism 04/02/2023   Elevated BP without diagnosis of hypertension 04/02/2023   Former smoker 04/02/2023   B12 deficiency 02/22/2020   Smoker 02/22/2020   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   Insomnia secondary to situational depression 09/03/2018   Vitamin D deficiency 09/03/2018   Atherosclerosis of aorta (HCC) 03/31/2018   Personal history of benign carcinoid tumor 03/19/2017   External hemorrhoids  with complication 03/10/2011   RENAL CALCULUS 08/16/2007   Hyperlipidemia, mixed 08/09/2007    Screening Tests Immunization History  Administered Date(s) Administered   Fluad Trivalent(High Dose 65+) 03/15/2023   Influenza, High Dose Seasonal PF 02/11/2018, 02/23/2020, 02/15/2022   Influenza-Unspecified 02/17/2019, 02/21/2021   Moderna Covid-19 Fall Seasonal Vaccine 68yrs & older 02/11/2022   PFIZER Comirnaty(Gray Top)Covid-19 Tri-Sucrose Vaccine 03/15/2020   PFIZER(Purple Top)SARS-COV-2 Vaccination 07/12/2019, 08/02/2019   Pfizer Covid-19 Vaccine Bivalent Booster 29yrs & up 01/29/2021   Pneumococcal Conjugate-13 04/30/2015   Pneumococcal Polysaccharide-23 03/31/2018   Td 08/02/1996, 03/28/2021   Tdap 03/10/2011   Zoster Recombinant(Shingrix) 08/06/2022   Health Maintenance  Topic Date Due   DEXA SCAN  Never done   COVID-19 Vaccine (6 - 2023-24 season) 04/18/2023 (Originally 01/18/2023)   Zoster Vaccines- Shingrix (2 of 2) 07/03/2023 (Originally 10/01/2022)   Medicare Annual Wellness (AWV)  04/01/2024   MAMMOGRAM   05/05/2024   DTaP/Tdap/Td (4 - Td or Tdap) 03/29/2031   Colonoscopy  07/15/2032   Pneumonia Vaccine 31+ Years old  Completed   INFLUENZA VACCINE  Completed   HPV VACCINES  Aged Out   Hepatitis C Screening  Discontinued     Names of Other Physician/Practitioners you currently use: 1. [facility] here for primary care 2. Last Eye exam:  [facility],2022- reminded to schedule an appointment 3. Last dental exam: upper dentures, partial on bottom 4. Dermatology- [facility]- biopsied 2 spots and both were benign 12/2022  Patient Care Team: Lucky Cowboy, MD as PCP - General (Internal Medicine)  SURGICAL HISTORY She  has a past surgical history that includes Vaginal hysterectomy; Knee surgery (07/2006); Breast cyst excision; Colonoscopy (nov 2011); laparoscopy (N/A, 12/10/2016); and laparotomy (N/A, 12/10/2016). FAMILY HISTORY Her family history includes Alzheimer's disease (age of onset: 24) in her mother; Atrial fibrillation in her mother; Heart disease in her father; Hypertension in her father; Leukemia in her maternal grandmother, paternal grandfather, and paternal uncle; Multiple sclerosis in her sister; Thyroid disease in her mother.   SOCIAL HISTORY She  reports that she has been smoking cigarettes. She started smoking about 45 years ago. She has a 5.5 pack-year smoking history. She has never used smokeless tobacco. She reports that she does not drink alcohol and does not use drugs.    MEDICARE WELLNESS OBJECTIVES: Physical activity:  Walking and doing chair aerobic 3-4 days a week Cardiac risk factors: Cardiac Risk Factors include: advanced age (>70men, >54 women);dyslipidemia;smoking/ tobacco exposure Depression/mood screen:      04/02/2023   11:38 AM  Depression screen PHQ 2/9  Decreased Interest 0  Down, Depressed, Hopeless 0  PHQ - 2 Score 0    ADLs:     04/02/2023   11:37 AM  In your present state of health, do you  have any difficulty performing the following activities:  Hearing? 0  Vision? 0  Difficulty concentrating or making decisions? 0  Walking or climbing stairs? 0  Dressing or bathing? 0  Doing errands, shopping? 0  Preparing Food and eating ? N  Using the Toilet? N  In the past six months, have you accidently leaked urine? N  Do you have problems with loss of bowel control? N  Managing your Medications? N  Managing your Finances? N  Housekeeping or managing your Housekeeping? N      Cognitive Testing  Alert? Yes  Normal Appearance?Yes  Oriented to person? Yes  Place? Yes   Time? Yes  Recall of three objects?  Yes  Can perform simple calculations? Yes  Displays appropriate judgment?Yes  Can read the correct time from a watch face?Yes  EOL planning: Does Patient Have a Medical Advance Directive?: No Would patient like information on creating a medical advance directive?: No - Patient declined  Review of Systems  Constitutional: Negative.  Negative for chills, fever and weight loss.  HENT: Negative.  Negative for congestion and hearing loss.   Eyes: Negative.  Negative for blurred vision and double vision.  Respiratory: Negative.  Negative for cough and shortness of breath.   Cardiovascular: Negative.  Negative for chest pain, palpitations, orthopnea and leg swelling.  Gastrointestinal: Negative.  Negative for abdominal pain, constipation, diarrhea, heartburn, nausea and vomiting.  Genitourinary: Negative.   Musculoskeletal:  Positive for joint pain (left elbow). Negative for falls and myalgias.  Skin: Negative.  Negative for rash.  Neurological:  Positive for headaches (intermittent x 2-3 months same time frame as bony change on forehead). Negative for dizziness, tingling, tremors and loss of consciousness.       Bony abnormality of right forehead  Endo/Heme/Allergies:  Bruises/bleeds easily.  Psychiatric/Behavioral:  Negative for depression, memory loss, substance abuse and  suicidal ideas. The patient has insomnia. The patient is not nervous/anxious.      Objective:     Today's Vitals   04/02/23 1055  BP: 120/70  Pulse: 73  Temp: (!) 97.5 F (36.4 C)  SpO2: 96%  Weight: 113 lb 3.2 oz (51.3 kg)  Height: 4\' 11"  (1.499 m)    Body mass index is 22.86 kg/m.  General appearance: alert, no distress, WD/WN, female HEENT: normocephalic, sclerae anicteric, TMs pearly, nares patent, no discharge or erythema, pharynx normal Oral cavity: MMM, no lesions Neck: supple, no lymphadenopathy, no thyromegaly, no masses Heart: RRR, normal S1, S2, no murmurs Lungs: CTA bilaterally, no wheezes, rhonchi, or rales Abdomen: +bs, soft, non tender, non distended, no masses, no hepatomegaly, no splenomegaly Skin : warm and dry, some small ecchymotic lesions on arms Musculoskeletal: nontender, no swelling,. Tenderness left lateral epicondyle. Bony enlargement right side of forehead nontender Extremities: no edema, no cyanosis, no clubbing Pulses: 2+ symmetric, upper and lower extremities, normal cap refill Neurological: alert, oriented x 3, CN2-12 intact, strength normal upper extremities and lower extremities, sensation normal throughout, DTRs 2+ throughout, no cerebellar signs, gait normal Psychiatric: normal affect, intermittently tearful, behavior normal, pleasant    Medicare Attestation I have personally reviewed: The patient's medical and social history Their use of alcohol, tobacco or illicit drugs Their current medications and supplements The patient's functional ability including ADLs,fall risks, home safety risks, cognitive, and hearing and visual impairment Diet and physical activities Evidence for depression or mood disorders  The patient's weight, height, BMI, and visual acuity have been recorded in the chart.  I have made referrals, counseling, and provided education to the patient based on review of the above and I have provided the patient with a written  personalized care plan for preventive services.     Raynelle Dick, NP   04/02/2023

## 2023-04-02 ENCOUNTER — Encounter: Payer: Self-pay | Admitting: Nurse Practitioner

## 2023-04-02 ENCOUNTER — Ambulatory Visit (INDEPENDENT_AMBULATORY_CARE_PROVIDER_SITE_OTHER): Payer: PPO | Admitting: Nurse Practitioner

## 2023-04-02 ENCOUNTER — Ambulatory Visit
Admission: RE | Admit: 2023-04-02 | Discharge: 2023-04-02 | Disposition: A | Discharge status: Home / Self Care | Payer: PPO | Source: Ambulatory Visit | Attending: Nurse Practitioner | Admitting: Nurse Practitioner

## 2023-04-02 VITALS — BP 120/70 | HR 73 | Temp 97.5°F | Ht 59.0 in | Wt 113.2 lb

## 2023-04-02 DIAGNOSIS — M25522 Pain in left elbow: Secondary | ICD-10-CM

## 2023-04-02 DIAGNOSIS — R7309 Other abnormal glucose: Secondary | ICD-10-CM | POA: Diagnosis not present

## 2023-04-02 DIAGNOSIS — R6889 Other general symptoms and signs: Secondary | ICD-10-CM

## 2023-04-02 DIAGNOSIS — G8929 Other chronic pain: Secondary | ICD-10-CM | POA: Diagnosis not present

## 2023-04-02 DIAGNOSIS — Z87891 Personal history of nicotine dependence: Secondary | ICD-10-CM | POA: Insufficient documentation

## 2023-04-02 DIAGNOSIS — E782 Mixed hyperlipidemia: Secondary | ICD-10-CM | POA: Diagnosis not present

## 2023-04-02 DIAGNOSIS — R030 Elevated blood-pressure reading, without diagnosis of hypertension: Secondary | ICD-10-CM

## 2023-04-02 DIAGNOSIS — E559 Vitamin D deficiency, unspecified: Secondary | ICD-10-CM

## 2023-04-02 DIAGNOSIS — E039 Hypothyroidism, unspecified: Secondary | ICD-10-CM | POA: Diagnosis not present

## 2023-04-02 DIAGNOSIS — E2839 Other primary ovarian failure: Secondary | ICD-10-CM

## 2023-04-02 DIAGNOSIS — Z79899 Other long term (current) drug therapy: Secondary | ICD-10-CM | POA: Diagnosis not present

## 2023-04-02 DIAGNOSIS — I700 Atherosclerosis of aorta: Secondary | ICD-10-CM

## 2023-04-02 DIAGNOSIS — Z0001 Encounter for general adult medical examination with abnormal findings: Secondary | ICD-10-CM

## 2023-04-02 DIAGNOSIS — F17200 Nicotine dependence, unspecified, uncomplicated: Secondary | ICD-10-CM

## 2023-04-02 DIAGNOSIS — Q799 Congenital malformation of musculoskeletal system, unspecified: Secondary | ICD-10-CM

## 2023-04-02 DIAGNOSIS — Z0000 Encounter for general adult medical examination without abnormal findings: Secondary | ICD-10-CM

## 2023-04-02 NOTE — Patient Instructions (Signed)
CT of head has been ordered at [facility] [address] They will be calling you to schedule an appointment  Elbow xray is ordered at [facility], can just walk in to have done  Decrease Rosuvastatin to 3 days a week and monitor leg pain to see if it improves  GENERAL HEALTH GOALS   Know what a healthy weight is for you (roughly BMI <25) and aim to maintain this   Aim for 7+ servings of fruits and vegetables daily   70-80+ fluid ounces of water or unsweet tea for healthy kidneys   Limit to max 1 drink of alcohol per day; avoid smoking/tobacco   Limit animal fats in diet for cholesterol and heart health - choose grass fed whenever available   Avoid highly processed foods, and foods high in saturated/trans fats   Aim for low stress - take time to unwind and care for your mental health   Aim for 150 min of moderate intensity exercise weekly for heart health, and weights twice weekly for bone health   Aim for 7-9 hours of sleep daily

## 2023-04-03 LAB — CBC WITH DIFFERENTIAL/PLATELET
Absolute Lymphocytes: 1519 {cells}/uL (ref 850–3900)
Absolute Monocytes: 531 {cells}/uL (ref 200–950)
Basophils Absolute: 98 {cells}/uL (ref 0–200)
Basophils Relative: 1.6 %
Eosinophils Absolute: 79 {cells}/uL (ref 15–500)
Eosinophils Relative: 1.3 %
HCT: 41.9 % (ref 35.0–45.0)
Hemoglobin: 13.3 g/dL (ref 11.7–15.5)
MCH: 29.2 pg (ref 27.0–33.0)
MCHC: 31.7 g/dL — ABNORMAL LOW (ref 32.0–36.0)
MCV: 91.9 fL (ref 80.0–100.0)
MPV: 10.5 fL (ref 7.5–12.5)
Monocytes Relative: 8.7 %
Neutro Abs: 3874 {cells}/uL (ref 1500–7800)
Neutrophils Relative %: 63.5 %
Platelets: 259 10*3/uL (ref 140–400)
RBC: 4.56 10*6/uL (ref 3.80–5.10)
RDW: 11.8 % (ref 11.0–15.0)
Total Lymphocyte: 24.9 %
WBC: 6.1 10*3/uL (ref 3.8–10.8)

## 2023-04-03 LAB — COMPLETE METABOLIC PANEL WITH GFR
AG Ratio: 2.1 (calc) (ref 1.0–2.5)
ALT: 17 U/L (ref 6–29)
AST: 18 U/L (ref 10–35)
Albumin: 4.5 g/dL (ref 3.6–5.1)
Alkaline phosphatase (APISO): 90 U/L (ref 37–153)
BUN: 13 mg/dL (ref 7–25)
CO2: 29 mmol/L (ref 20–32)
Calcium: 9.5 mg/dL (ref 8.6–10.4)
Chloride: 104 mmol/L (ref 98–110)
Creat: 0.72 mg/dL (ref 0.60–1.00)
Globulin: 2.1 g/dL (ref 1.9–3.7)
Glucose, Bld: 90 mg/dL (ref 65–99)
Potassium: 4.5 mmol/L (ref 3.5–5.3)
Sodium: 142 mmol/L (ref 135–146)
Total Bilirubin: 0.4 mg/dL (ref 0.2–1.2)
Total Protein: 6.6 g/dL (ref 6.1–8.1)
eGFR: 90 mL/min/{1.73_m2} (ref >=60)

## 2023-04-03 LAB — LIPID PANEL
Cholesterol: 209 mg/dL — ABNORMAL HIGH (ref <200)
HDL: 92 mg/dL (ref >=50)
LDL Cholesterol (Calc): 96 mg/dL
Non-HDL Cholesterol (Calc): 117 mg/dL (ref <130)
Total CHOL/HDL Ratio: 2.3 (calc) (ref <5.0)
Triglycerides: 113 mg/dL (ref <150)

## 2023-04-03 LAB — MAGNESIUM: Magnesium: 2.2 mg/dL (ref 1.5–2.5)

## 2023-04-03 LAB — TSH: TSH: 3.12 m[IU]/L (ref 0.40–4.50)

## 2023-04-14 ENCOUNTER — Ambulatory Visit
Admission: RE | Admit: 2023-04-14 | Discharge: 2023-04-14 | Disposition: A | Discharge status: Home / Self Care | Payer: PPO | Source: Ambulatory Visit | Attending: Nurse Practitioner | Admitting: Nurse Practitioner

## 2023-04-14 DIAGNOSIS — Q799 Congenital malformation of musculoskeletal system, unspecified: Secondary | ICD-10-CM

## 2023-04-14 DIAGNOSIS — M899 Disorder of bone, unspecified: Secondary | ICD-10-CM | POA: Diagnosis not present

## 2023-04-22 ENCOUNTER — Other Ambulatory Visit: Payer: Self-pay | Admitting: Nurse Practitioner

## 2023-04-22 DIAGNOSIS — M8938 Hypertrophy of bone, other site: Secondary | ICD-10-CM

## 2023-05-08 DIAGNOSIS — E2839 Other primary ovarian failure: Secondary | ICD-10-CM | POA: Diagnosis not present

## 2023-05-08 LAB — HM DEXA SCAN

## 2023-05-14 DIAGNOSIS — Z13820 Encounter for screening for osteoporosis: Secondary | ICD-10-CM | POA: Diagnosis not present

## 2023-05-14 DIAGNOSIS — M810 Age-related osteoporosis without current pathological fracture: Secondary | ICD-10-CM | POA: Diagnosis not present

## 2023-05-14 DIAGNOSIS — E2839 Other primary ovarian failure: Secondary | ICD-10-CM | POA: Diagnosis not present

## 2023-05-21 NOTE — Progress Notes (Signed)
 Assessment and Plan:  [name] was seen today for acute visit.  Diagnoses and all orders for this visit:  Age-related osteoporosis without current pathological fracture Weight bearing exercise- walking with an 8 pound weight vest Taking Vit D 5000 units daily Discussed Fosamax but pt declines at this time  Elevated BP without diagnosis of hypertension - continue DASH diet, exercise and monitor at home. Call if greater than 130/80.        Further disposition pending results of labs. Discussed med's effects and SE's.   Over 30 minutes of exam, counseling, chart review, and critical decision making was performed.   Future Appointments  Date Time Provider Department Center  05/27/2023 11:30 AM [facility]  07/03/2023  9:30 AM Penumalli, Eduard SAUNDERS, MD [facility] None  09/25/2023 10:00 AM Jude Lonell BRAVO, NP [facility] None  04/01/2024 10:30 AM Jude Lonell BRAVO, NP [facility] None    ------------------------------------------------------------------------------------------------------------------   HPI BP 138/72   Pulse 68   Temp (!) 97.3 F (36.3 C)   Ht 4' 11 (1.499 m)   Wt 111 lb 12.8 oz (50.7 kg)   SpO2 97%   BMI 22.58 kg/m  72 y.o.female presents for discussion of DEXA results.  Dexa T-score -2.5 with high fracture risk. Here to discuss treatment options.   BP well controlled without medication BP Readings from Last 3 Encounters:  05/22/23 138/72  04/02/23 120/70  09/25/22 110/68  Denies headaches, chest pain, shortness of breath and dizziness   BMI is Body mass index is 22.58 kg/m., she has been working on diet and exercise. Wt Readings from Last 3 Encounters:  05/22/23 111 lb 12.8 oz (50.7 kg)  04/02/23 113 lb 3.2 oz (51.3 kg)  09/25/22 110 lb 9.6 oz (50.2 kg)   She is not currently taking Vit D Lab Results  Component Value Date   VD25OH 66 09/25/2022     She is having trouble with Myalgias on Rosuvastatin  5 mg every day, stopped  for 2 weeks and did help symptoms and so is now taking 2 days a week    Lab Results  Component Value Date   CHOL 209 (H) 04/02/2023   HDL 92 04/02/2023   LDLCALC 96 04/02/2023   LDLDIRECT 166.3 03/05/2011   TRIG 113 04/02/2023   CHOLHDL 2.3 04/02/2023     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   Cholecalciferol (VITAMIN D ) 125 MCG (5000 UT) CAPS Take by mouth. 2 times a week   docusate sodium  (COLACE) 100 MG capsule Take 100 mg by mouth 2 (two) times daily.   lidocaine  (XYLOCAINE ) 2 % solution Use as directed 15 mLs in the mouth or throat as needed for mouth pain.   omeprazole  (PRILOSEC) 20 MG capsule Take 20 mg by mouth daily.   rosuvastatin  (CRESTOR ) 5 MG tablet TAKE 1 TABLET(5 MG) BY MOUTH DAILY   traZODone  (DESYREL ) 50 MG tablet TAKE 1 TO 1 AND 1/2 TABLETS BY MOUTH DAILY FOR SLEEP   zinc gluconate 50 MG tablet Take 50 mg by mouth daily.   No current facility-administered medications on file prior to visit.  ROS: all negative except above.   Physical Exam:  BP 138/72   Pulse 68   Temp (!) 97.3 F (36.3 C)   Ht 4' 11 (1.499 m)   Wt 111 lb 12.8 oz (50.7 kg)   SpO2 97%   BMI 22.58 kg/m   General Appearance: Very pleasant thin female, in no apparent distress. Eyes: PERRLA, EOMs, conjunctiva no swelling or erythema Sinuses: No Frontal/maxillary tenderness ENT/Mouth: Ext aud canals clear, TMs without erythema, bulging. No erythema, swelling, or exudate on post pharynx.  Hearing normal.  Neck: Supple, thyroid  normal.  Respiratory: Respiratory effort normal, BS equal bilaterally without rales, rhonchi, wheezing or stridor.  Cardio: RRR with no MRGs. Brisk  peripheral pulses without edema.  Abdomen: Soft, + BS.  Non tender, no guarding, rebound, hernias, masses. Lymphatics: Non tender without lymphadenopathy.  Musculoskeletal: Full ROM, 5/5 strength, normal gait.  Skin: Warm, dry without rashes, lesions, ecchymosis.  Neuro: Cranial nerves intact. Normal muscle tone, no cerebellar symptoms. Sensation intact.  Psych: Awake and oriented X 3, normal affect, Insight and Judgment appropriate.     [name] E Natasha Youtz, NP 11:46 AM [name]

## 2023-05-22 ENCOUNTER — Encounter: Payer: Self-pay | Admitting: Internal Medicine

## 2023-05-22 ENCOUNTER — Ambulatory Visit (INDEPENDENT_AMBULATORY_CARE_PROVIDER_SITE_OTHER): Payer: PPO | Admitting: Nurse Practitioner

## 2023-05-22 ENCOUNTER — Encounter: Payer: Self-pay | Admitting: Nurse Practitioner

## 2023-05-22 VITALS — BP 138/72 | HR 68 | Temp 97.3°F | Ht 59.0 in | Wt 111.8 lb

## 2023-05-22 DIAGNOSIS — M810 Age-related osteoporosis without current pathological fracture: Secondary | ICD-10-CM

## 2023-05-22 DIAGNOSIS — R030 Elevated blood-pressure reading, without diagnosis of hypertension: Secondary | ICD-10-CM | POA: Diagnosis not present

## 2023-05-22 NOTE — Patient Instructions (Signed)
 Weight bearing exercise- walking with an 8 pound weight vest  Taking Vit D 5000 units daily  Eating Plan for Osteoporosis Osteoporosis causes your bones to become weak and brittle. This puts you at greater risk for bone breaks (fractures) from small bumps or falls. Making changes to your diet and increasing your physical activity can help strengthen your bones and improve your overall health. Calcium  and vitamin D  are nutrients that play an important role in bone health. Vitamin D  helps your body use calcium  and strengthen bones. It is important to get enough calcium  and vitamin D  as part of your eating plan for osteoporosis. What are tips for following this plan? Reading food labels Try to get at least 1,000 milligrams (mg) of calcium  each day. Look for foods that have at least 50 mg of calcium  per serving. Talk with your health care provider about taking a calcium  supplement if you do not get enough calcium  from food. Do not have more than 2,500 mg of calcium  each day. This is the upper limit for food and nutritional supplements combined. Too much calcium  may cause constipation and prevent you from absorbing other important nutrients. Choose foods that contain vitamin D . Take a daily vitamin supplement that contains 800-1,000 international units (IU) of vitamin D . The amount may be different depending on your age, body weight, and where you live. Talk with your dietitian or health care provider about how much vitamin D  is right for you. Avoid foods that have more than 300 mg of sodium per serving. Too much sodium can cause your body to lose calcium . Talk with your dietitian or health care provider about how much sodium you are allowed each day. Shopping Do not buy foods with added salt, including: Salted snacks. Dene. Canned soups. Canned meats. Processed meats, such as bacon or precooked or cured meat like sausages or meat loaves. Smoked fish. Meal planning Eat balanced meals that  contain protein foods, fruits and vegetables, and foods rich in calcium  and vitamin D . Eat at least 5 servings of fruits and vegetables each day. Eat 5-6 oz (142-170 g) of lean meat, poultry, fish, eggs, or beans each day. Lifestyle Do not use any products that contain nicotine or tobacco, such as cigarettes, e-cigarettes, and chewing tobacco. If you need help quitting, ask your health care provider. If your health care provider recommends that you lose weight: Work with a dietitian to develop an eating plan that will help you reach your desired weight goal. Exercise for at least 30 minutes a day, 5 or more days a week, or as told by your health care provider. Work with a physical therapist to develop an exercise plan that includes flexibility, balance, and strength exercises. Do not focus only on aerobic exercise. Do not drink alcohol if: Your health care provider tells you not to drink. You are pregnant, may be pregnant, or are planning to become pregnant. If you drink alcohol: Limit how much you use to: 0-1 drink a day for women. 0-2 drinks a day for men. Be aware of how much alcohol is in your drink. In the [place] one drink equals one 12 oz bottle of beer (355 mL), one 5 oz glass of wine (148 mL), or one 1 oz glass of hard liquor (44 mL). What foods should I eat? Foods high in calcium   Yogurt. Yogurt with fruit. Milk. Evaporated skim milk. Dry milk powder. Calcium -fortified orange juice. Parmesan cheese. Part-skim ricotta cheese. Natural hard cheese. Cream cheese. Cottage cheese. Canned sardines.  Canned salmon. Calcium -treated tofu. Calcium -fortified cereal bar. Calcium -fortified cereal. Calcium -fortified graham crackers. Cooked collard greens. Turnip greens. Broccoli. Kale. Almonds. White beans. Corn tortilla. Foods high in vitamin D  Cod liver oil. Fatty fish, such as tuna, mackerel, and salmon. Milk. Fortified soy milk. Fortified fruit juice. Yogurt. Margarine. Egg  yolks. Foods high in protein Beef. Lamb. Pork tenderloin. Chicken breast. Tuna (canned). Fish fillet. Tofu. Cooked soy beans. Soy patty. Beans (canned or cooked). Cottage cheese. Yogurt. Peanut butter. Pumpkin seeds. Nuts. Sunflower seeds. Hard cheese. Milk or other milk products, such as soy milk. The items listed above may not be a complete list of foods and beverages you can eat. Contact a dietitian for more options. Summary Calcium  and vitamin D  are nutrients that play an important role in bone health and are an important part of your eating plan for osteoporosis. Eat balanced meals that contain protein foods, fruits and vegetables, and foods rich in calcium  and vitamin D . Avoid foods that have more than 300 mg of sodium per serving. Too much sodium can cause your body to lose calcium . Exercise is an important part of prevention and treatment of osteoporosis. Aim for at least 30 minutes a day, 5 days a week. This information is not intended to replace advice given to you by your health care provider. Make sure you discuss any questions you have with your health care provider. Document Revised: 10/20/2019 Document Reviewed: 10/20/2019 Elsevier Patient Education  2024 [employer].

## 2023-05-27 ENCOUNTER — Ambulatory Visit
Admission: RE | Admit: 2023-05-27 | Discharge: 2023-05-27 | Disposition: A | Discharge status: Home / Self Care | Payer: PPO | Source: Ambulatory Visit | Attending: Internal Medicine | Admitting: Internal Medicine

## 2023-05-27 DIAGNOSIS — Z1231 Encounter for screening mammogram for malignant neoplasm of breast: Secondary | ICD-10-CM

## 2023-06-02 ENCOUNTER — Telehealth: Payer: Self-pay | Admitting: Nurse Practitioner

## 2023-06-02 ENCOUNTER — Ambulatory Visit (HOSPITAL_BASED_OUTPATIENT_CLINIC_OR_DEPARTMENT_OTHER)
Admission: EM | Admit: 2023-06-02 | Discharge: 2023-06-02 | Disposition: A | Discharge status: Home / Self Care | Payer: PPO | Attending: Family Medicine | Admitting: Family Medicine

## 2023-06-02 ENCOUNTER — Encounter (HOSPITAL_BASED_OUTPATIENT_CLINIC_OR_DEPARTMENT_OTHER): Payer: Self-pay

## 2023-06-02 DIAGNOSIS — M5442 Lumbago with sciatica, left side: Secondary | ICD-10-CM | POA: Diagnosis not present

## 2023-06-02 LAB — POCT URINALYSIS DIP (MANUAL ENTRY)
Bilirubin, UA: NEGATIVE
Blood, UA: NEGATIVE
Glucose, UA: NEGATIVE mg/dL
Ketones, POC UA: NEGATIVE mg/dL
Leukocytes, UA: NEGATIVE
Nitrite, UA: NEGATIVE
Protein Ur, POC: 30 mg/dL — AB
Spec Grav, UA: >=1.03 — AB (ref 1.010–1.025)
Urobilinogen, UA: 0.2 U/dL
pH, UA: 5.5 (ref 5.0–8.0)

## 2023-06-02 MED ORDER — HYDROCODONE-ACETAMINOPHEN 5-325 MG PO TABS: 1.0000 | ORAL_TABLET | Freq: Four times a day (QID) | ORAL | 0 refills | Status: DC | PRN

## 2023-06-02 MED ORDER — KETOROLAC TROMETHAMINE 15 MG/ML IJ SOLN
15.0000 mg | Freq: Once | INTRAMUSCULAR | Status: AC
  Administered 2023-06-02: 15 mg via INTRAMUSCULAR

## 2023-06-02 NOTE — ED Triage Notes (Signed)
 Left hip pain with radiation down left leg onset this morning after getting out of bed at 0630. Pain worse when attempts made to sit. Patient reports hx of kidney stones x approx 50. States have never felt like this. Has taken aleve and a muscle relaxer this morning. Denies dysuria.

## 2023-06-02 NOTE — Discharge Instructions (Signed)
 The urinalysis did not show any red blood cells that would be consistent with a kidney stone.  Also there were no white blood cells that would be responding to any urinary infection.  You have been given a shot of Toradol  15 mg today.  Hydrocodone  5 mg--1 tablet every 6 hours as needed for pain.  This is best taken with food.  It can cause sleepiness or dizziness  Please make a follow-up appointment with your primary care about this issue.  If your pain is not relieved by our treatments or if you worsen anyway, then please go to the emergency room for further evaluation

## 2023-06-02 NOTE — ED Provider Notes (Addendum)
 [facility]    CSN: [id number] Arrival date & time: 06/02/23  1053      History   Chief Complaint Chief Complaint  Patient presents with   Hip Pain    HPI [name] is a 73 y.o. female.    Hip Pain  Here for left low back pain that is radiating into her left upper lateral thigh.  It began this morning between 530 and 6:00, about 5 hours ago.  No trauma or fall.  She has a little dysesthesia over the lateral proximal thigh, but no rash or fever and no tingling over the left low back.  She has had a lot of kidney stones in the past, but it has been over 5 years since she had 1.  No allergies to medications  Last EGFR was 90.  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   Vitamin D  deficiency 09/03/2018   Atherosclerosis of aorta (HCC) 03/31/2018   Personal history of benign carcinoid tumor 03/19/2017   External hemorrhoids with complication 03/10/2011   RENAL CALCULUS 08/16/2007   Hyperlipidemia, mixed 08/09/2007    Past Surgical History:  Procedure Laterality Date   BREAST CYST EXCISION     X2   COLONOSCOPY  nov 2011   KNEE SURGERY  07/2006   right   LAPAROSCOPY N/A 12/10/2016   Procedure: LAPAROSCOPY DIAGNOSTIC;  Surgeon: Vernetta Berg, MD;  Location: [facility];  Service: General;  Laterality: N/A;   LAPAROTOMY N/A 12/10/2016   Procedure: POSSIBLE EXPLORATORY LAPAROTOMY, POSSIBLE SMALL BOWEL RESECTION;  Surgeon: Vernetta Berg, MD;   Location: [facility];  Service: General;  Laterality: N/A;   VAGINAL HYSTERECTOMY     total    OB History   No obstetric history on file.      Home Medications    Prior to Admission medications   Medication Sig Start Date End Date Taking? Authorizing Provider  HYDROcodone -acetaminophen  (NORCO/VICODIN) 5-325 MG tablet Take 1 tablet by mouth every 6 (six) hours as needed (pain). 06/02/23  Yes Vonna Sharlet POUR, MD  Acetaminophen  (TYLENOL  PO) Take by mouth.    [provider]  docusate sodium  (COLACE) 100 MG capsule Take 100 mg by mouth 2 (two) times daily.    [provider]  omeprazole  (PRILOSEC) 20 MG capsule Take 20 mg by mouth daily. 07/18/22   [provider]  rosuvastatin  (CRESTOR ) 5 MG tablet TAKE 1 TABLET(5 MG) BY MOUTH DAILY 06/12/22   Wilkinson, Dana E, NP  traZODone  (DESYREL ) 50 MG tablet TAKE 1 TO 1 AND 1/2 TABLETS BY MOUTH DAILY FOR SLEEP 03/23/23   Wilkinson, Dana E, NP  zinc gluconate 50 MG tablet Take 50 mg by mouth daily.    [provider]    Family History Family History  Problem Relation Age of Onset   Atrial fibrillation Mother    Thyroid  disease Mother    Alzheimer's disease Mother 41   Hypertension Father    Heart disease Father  heart attack   Multiple sclerosis Sister    Leukemia Paternal Uncle        leukemia   Leukemia Maternal Grandmother        leukemia   Leukemia Paternal Grandfather        leukemia   Breast cancer Neg Hx     Social History Social History   Tobacco Use   Smoking status: Every Day    Current packs/day: 0.12    Average packs/day: 0.1 packs/day for 46.0 years (5.5 ttl pk-yrs)    Types: Cigarettes    Start date: 1979   Smokeless tobacco: Never   Tobacco comments:    pack lasts 1 week, never more than 0.25 pack in a day  Vaping Use   Vaping status: Never Used  Substance Use Topics   Alcohol use: No    Alcohol/week: 0.0 standard drinks of alcohol   Drug use: No     Allergies    Patient has no known allergies.   Review of Systems Review of Systems   Physical Exam Triage Vital Signs ED Triage Vitals [06/02/23 1106]  Encounter Vitals Group     BP (!) 142/67     Systolic BP Percentile      Diastolic BP Percentile      Pulse Rate 72     Resp 20     Temp 98 F (36.7 C)     Temp Source Oral     SpO2 96 %     Weight      Height      Head Circumference      Peak Flow      Pain Score 9     Pain Loc      Pain Education      Exclude from Growth Chart    No data found.  Updated Vital Signs BP (!) 142/67 (BP Location: Right Arm)   Pulse 72   Temp 98 F (36.7 C) (Oral)   Resp 20   SpO2 96%   Visual Acuity Right Eye Distance:   Left Eye Distance:   Bilateral Distance:    Right Eye Near:   Left Eye Near:    Bilateral Near:     Physical Exam Vitals reviewed.  Constitutional:      General: She is not in acute distress.    Appearance: She is not ill-appearing, toxic-appearing or diaphoretic.  HENT:     Mouth/Throat:     Mouth: Mucous membranes are moist.  Eyes:     Conjunctiva/sclera: Conjunctivae normal.     Pupils: Pupils are equal, round, and reactive to light.  Cardiovascular:     Rate and Rhythm: Normal rate and regular rhythm.     Heart sounds: No murmur heard. Pulmonary:     Effort: Pulmonary effort is normal.     Breath sounds: Normal breath sounds.  Abdominal:     Palpations: Abdomen is soft.     Tenderness: There is no right CVA tenderness or left CVA tenderness.  Musculoskeletal:     Comments: There is no rash or redness visible over where she is hurting in her left lumbosacral area.  She is tender there.  Straight leg raise increases the pain in the left low back.  Skin:    Coloration: Skin is not pale.  Neurological:     General: No focal deficit present.     Mental Status: She is alert and oriented to person, place, and time.  Psychiatric:  Behavior: Behavior normal.      UC Treatments / Results  Labs (all  labs ordered are listed, but only abnormal results are displayed) Labs Reviewed  POCT URINALYSIS DIP (MANUAL ENTRY) - Abnormal; Notable for the following components:      Result Value   Color, UA orange (*)    Spec Grav, UA >=1.030 (*)    Protein Ur, POC =30 (*)    All other components within normal limits    EKG   Radiology No results found.  Procedures Procedures (including critical care time)  Medications Ordered in UC Medications  ketorolac  (TORADOL ) 15 MG/ML injection 15 mg (has no administration in time range)    Initial Impression / Assessment and Plan / UC Course  I have reviewed the triage vital signs and the nursing notes.  Pertinent labs & imaging results that were available during my care of the patient were reviewed by me and considered in my medical decision making (see chart for details).     There is no blood or leukocytes on the UA dip. Toradol  15 mg is given here and hydrocodone  sent to the pharmacy, as she is in a lot of discomfort.  I have asked her to follow-up with her primary care  Final Clinical Impressions(s) / UC Diagnoses   Final diagnoses:  Acute left-sided low back pain with left-sided sciatica     Discharge Instructions      The urinalysis did not show any red blood cells that would be consistent with a kidney stone.  Also there were no white blood cells that would be responding to any urinary infection.  You have been given a shot of Toradol  15 mg today.  Hydrocodone  5 mg--1 tablet every 6 hours as needed for pain.  This is best taken with food.  It can cause sleepiness or dizziness  Please make a follow-up appointment with your primary care about this issue.  If your pain is not relieved by our treatments or if you worsen anyway, then please go to the emergency room for further evaluation      ED Prescriptions     Medication Sig Dispense Auth. Provider   HYDROcodone -acetaminophen  (NORCO/VICODIN) 5-325 MG tablet Take 1  tablet by mouth every 6 (six) hours as needed (pain). 12 tablet Jillianna Stanek K, MD      I have reviewed the PDMP during this encounter.   Vonna Sharlet POUR, MD 06/02/23 1127    Vonna Sharlet POUR, MD 06/02/23 1128

## 2023-06-02 NOTE — Telephone Encounter (Signed)
 For documentation purposes.Natasha KitchenMarland KitchenPatient called to tell you she thinks she pulled a muscle. I offered her an appointment for today or tomorrow but she declined and she said she is going to go to urgent care.

## 2023-06-03 ENCOUNTER — Ambulatory Visit (INDEPENDENT_AMBULATORY_CARE_PROVIDER_SITE_OTHER): Payer: PPO | Admitting: Nurse Practitioner

## 2023-06-03 ENCOUNTER — Telehealth: Payer: Self-pay

## 2023-06-03 ENCOUNTER — Other Ambulatory Visit: Payer: Self-pay | Admitting: Nurse Practitioner

## 2023-06-03 ENCOUNTER — Ambulatory Visit
Admission: RE | Admit: 2023-06-03 | Discharge: 2023-06-03 | Disposition: A | Discharge status: Home / Self Care | Payer: PPO | Source: Ambulatory Visit | Attending: Nurse Practitioner | Admitting: Nurse Practitioner

## 2023-06-03 ENCOUNTER — Encounter: Payer: Self-pay | Admitting: Nurse Practitioner

## 2023-06-03 VITALS — BP 110/62 | HR 70 | Temp 97.7°F | Ht 59.0 in | Wt 112.2 lb

## 2023-06-03 DIAGNOSIS — M62830 Muscle spasm of back: Secondary | ICD-10-CM

## 2023-06-03 DIAGNOSIS — M5442 Lumbago with sciatica, left side: Secondary | ICD-10-CM

## 2023-06-03 MED ORDER — TRAMADOL HCL 50 MG PO TABS: 50.0000 mg | ORAL_TABLET | Freq: Four times a day (QID) | ORAL | 0 refills | Status: AC | PRN

## 2023-06-03 MED ORDER — CYCLOBENZAPRINE HCL 10 MG PO TABS: 10.0000 mg | ORAL_TABLET | Freq: Three times a day (TID) | ORAL | 0 refills | Status: DC | PRN

## 2023-06-03 MED ORDER — DEXAMETHASONE 4 MG PO TABS: ORAL_TABLET | ORAL | 0 refills | Status: DC

## 2023-06-03 NOTE — Telephone Encounter (Signed)
Prior auth completed and submitted. 

## 2023-06-03 NOTE — Patient Instructions (Addendum)
 Start tramadol  alternating with Tylenol  extra strength every 3 hours Cyclobenzaprine  1 tab every 8 hours for muscle relaxant- can make you sleepy Dexamethasone  as directed to decrease inflammation(steroid) Go to get xrays at [facility] [address]  Sciatica  Sciatica is pain, numbness, weakness, or tingling along the path of the sciatic nerve. The sciatic nerve starts in the lower back and runs down the back of each leg. The nerve controls the muscles in the lower leg and in the back of the knee. It also provides feeling (sensation) to the back of the thigh, the lower leg, and the sole of the foot. Sciatica is a symptom of another medical condition that pinches or puts pressure on the sciatic nerve. Sciatica most often only affects one side of the body. Sciatica usually goes away on its own or with treatment. In some cases, sciatica may come back (recur). What are the causes? This condition is caused by pressure on the sciatic nerve or pinching of the nerve. This may be the result of: A disk in between the bones of the spine bulging out too far (herniated disk). Age-related changes in the spinal disks. A pain disorder that affects a muscle in the buttock. Extra bone growth near the sciatic nerve. A break (fracture) of the pelvis. Pregnancy. Tumor. This is rare. What increases the risk? The following factors may make you more likely to develop this condition: Playing sports that place pressure or stress on the spine. Having poor strength and flexibility. A history of back injury or surgery. Sitting for long periods of time. Doing activities that involve repetitive bending or lifting. Obesity. What are the signs or symptoms? Symptoms can vary from mild to very severe. They may include: Any of the following problems in the lower back, leg, hip, or buttock: Mild tingling, numbness, or dull aches. Burning sensations. Sharp pains. Numbness in the back of the calf or the sole  of the foot. Leg weakness. Severe back pain that makes movement difficult. Symptoms may get worse when you cough, sneeze, or laugh, or when you sit or stand for long periods of time. How is this diagnosed? This condition may be diagnosed based on: Your symptoms and medical history. A physical exam. Blood tests. Imaging tests, such as: X-rays. An MRI. A CT scan. How is this treated? In many cases, this condition improves on its own without treatment. However, treatment may include: Reducing or modifying physical activity. Exercising, including strengthening and stretching. Icing and applying heat to the affected area. Medicines that help to: Relieve pain and swelling. Relax your muscles. Injections of medicines that help to relieve pain and inflammation (steroids) around the sciatic nerve. Surgery. Follow these instructions at home: Medicines Take over-the-counter and prescription medicines only as told by your health care provider. Ask your health care provider if the medicine prescribed to you requires you to avoid driving or using heavy machinery. Managing pain     If directed, put ice on the affected area. To do this: Put ice in a plastic bag. Place a towel between your skin and the bag. Leave the ice on for 20 minutes, 2-3 times a day. If your skin turns bright red, remove the ice right away to prevent skin damage. The risk of skin damage is higher if you cannot feel pain, heat, or cold. If directed, apply heat to the affected area as often as told by your health care provider. Use the heat source that your health care provider recommends, such  as a moist heat pack or a heating pad. Place a towel between your skin and the heat source. Leave the heat on for 20-30 minutes. If your skin turns bright red, remove the heat right away to prevent burns. The risk of burns is higher if you cannot feel pain, heat, or cold. Activity  Return to your normal activities as told by your  health care provider. Ask your health care provider what activities are safe for you. Avoid activities that make your symptoms worse. Take brief periods of rest throughout the day. When you rest for longer periods, mix in some mild activity or stretching between periods of rest. This will help to prevent stiffness and pain. Avoid sitting for long periods of time without moving. Get up and move around at least one time each hour. Exercise and stretch regularly as told by your health care provider. Do not lift anything that is heavier than 10 lb (4.5 kg) until your health care provider says that it is safe. When you do not have symptoms, you should still avoid heavy lifting, especially repetitive heavy lifting. When you lift objects, always use proper lifting technique, which includes: Bending your knees. Keeping the load close to your body. Avoiding twisting. General instructions Maintain a healthy weight. Excess weight puts extra stress on your back. Wear supportive, comfortable shoes. Avoid wearing high heels. Avoid sleeping on a mattress that is too soft or too hard. A mattress that is firm enough to support your back when you sleep may help to reduce your pain. Contact a health care provider if: Your pain is not controlled by medicine. Your pain does not improve or gets worse. Your pain lasts longer than 4 weeks. You have unexplained weight loss. Get help right away if: You are not able to control when you urinate or have bowel movements (incontinence). You have: Weakness in your lower back, pelvis, buttocks, or legs that gets worse. Redness or swelling of your back. A burning sensation when you urinate. Summary Sciatica is pain, numbness, weakness, or tingling along the path of the sciatic nerve, which may include the lower back, legs, hips, and buttocks. This condition is caused by pressure on the sciatic nerve or pinching of the nerve. Treatment often includes rest, exercise,  medicines, and applying ice or heat. This information is not intended to replace advice given to you by your health care provider. Make sure you discuss any questions you have with your health care provider. Document Revised: 08/12/2021 Document Reviewed: 08/12/2021 Elsevier Patient Education  2024 [employer].

## 2023-06-03 NOTE — Progress Notes (Signed)
 Assessment and Plan:  Cherree was seen today for hip pain.  Diagnoses and all orders for this visit:  Acute back pain with sciatica, left Start tramadol  alternating with Tylenol  extra strength every 3 hours Cyclobenzaprine  1 tab every 8 hours for muscle relaxant- can make you sleepy Dexamethasone  as directed to decrease inflammation(steroid) Go to get xrays at [facility] [address] [employer] Will treat further pending xray results -     DG Lumbar Spine Complete; Future -     DG Hip Unilat W OR W/O Pelvis Min 4 Views Left; Future -     dexamethasone  (DECADRON ) 4 MG tablet; Take 3 tabs for 3 days, 2 tabs for 3 days 1 tab for 5 days. Take with food. -     cyclobenzaprine  (FLEXERIL ) 10 MG tablet; Take 1 tablet (10 mg total) by mouth 3 (three) times daily as needed for muscle spasms. -     traMADol  (ULTRAM ) 50 MG tablet; Take 1 tablet (50 mg total) by mouth every 6 (six) hours as needed for up to 5 days.  Muscle spasm of back Use cyclobenzaprine  every 8 hours as needed- can make sleepy Alternate ice and heat Prednisone  taper as directed      Further disposition pending results of labs. Discussed med's effects and SE's.   Over 30 minutes of exam, counseling, chart review, and critical decision making was performed.   Future Appointments  Date Time Provider Department Center  07/03/2023  9:30 AM Omega Bible, MD [facility] None  09/25/2023 10:00 AM Stephane Ee, NP [facility] None  04/01/2024 10:30 AM Stephane Ee, NP [facility] None    ------------------------------------------------------------------------------------------------------------------   HPI BP 110/62   Pulse 70   Temp 97.7 F (36.5 C)   Ht 4\' 11"  (1.499 m)   Wt 112 lb 3.2 oz (50.9 kg)   SpO2 97%   BMI 22.66 kg/m   73 y.o.female presents for pain in left hip.  Was seen at urgent care in [facility] 06/02/23  Here for left low back pain that is radiating into her left upper lateral thigh.  It  began this morning between 530 and 6:00, about 5 hours ago.   No trauma or fall.  She has a little dysesthesia over the lateral proximal thigh, but no rash or fever and no tingling over the left low back.   She has had a lot of kidney stones in the past, but it has been over 5 years since she had 1. She was  given a shot of Toradol  15 mg x 1 and sent home with hydrocodone  5 mg 1 every 6 hours as needed. Notes states:  The urinalysis did not show any red blood cells that would be consistent with a kidney stone.  Also there were no white blood cells that would be responding to any urinary infection.  She describes the pain as constant ache that is sharp with movement 10/10. Pain does radiate down her left leg.   BP well controlled without medication BP Readings from Last 3 Encounters:  06/03/23 110/62  06/02/23 (!) 142/67  05/22/23 138/72  Denies headaches, chest pain, shortness of breath and dizziness   BMI is Body mass index is 22.66 kg/m., she has been working on diet and exercise. 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   docusate sodium  (COLACE) 100 MG capsule Take 100 mg by mouth 2 (two) times daily.   HYDROcodone -acetaminophen  (NORCO/VICODIN) 5-325 MG tablet Take 1 tablet by mouth every 6 (six) hours as needed (pain).   omeprazole  (PRILOSEC) 20 MG capsule Take 20 mg by mouth daily.   rosuvastatin  (CRESTOR ) 5 MG tablet TAKE 1 TABLET(5 MG) BY MOUTH DAILY   traZODone  (DESYREL ) 50 MG tablet TAKE 1 TO 1 AND 1/2 TABLETS BY MOUTH  DAILY FOR SLEEP   zinc gluconate 50 MG tablet Take 50 mg by mouth daily.   No current facility-administered medications on file prior to visit.    ROS: all negative except above.   Physical Exam:  BP 110/62   Pulse 70   Temp 97.7 F (36.5 C)   Ht 4\' 11"  (1.499 m)   Wt 112 lb 3.2 oz (50.9 kg)   SpO2 97%   BMI 22.66 kg/m   General Appearance: Pleasant female who appears in a lot of pain Eyes: PERRLA, EOMs, conjunctiva no swelling or erythema Respiratory: Respiratory effort normal, BS equal bilaterally without rales, rhonchi, wheezing or stridor.  Cardio: RRR with no MRGs. Brisk peripheral pulses without edema.  Abdomen: Soft, + BS.  Non tender, no guarding, rebound, hernias, masses. Lymphatics: Non tender without lymphadenopathy.  Musculoskeletal: Limited ROM of left leg, very antalgic gait- difficulty standing up straight Muscle spasm noted of left lumbar region. Pain middle of left buttock that radiates down left leg with pressure Skin: Warm, dry without rashes, lesions, ecchymosis.  Neuro: Cranial nerves intact. Normal muscle tone, no cerebellar symptoms. Sensation intact.  Psych: Awake and oriented X 3,  Insight and Judgment appropriate.     Zaiden Ludlum E Eleonore Shippee, NP 11:31 AM [facility]

## 2023-06-14 ENCOUNTER — Other Ambulatory Visit: Payer: Self-pay | Admitting: Nurse Practitioner

## 2023-06-14 DIAGNOSIS — E785 Hyperlipidemia, unspecified: Secondary | ICD-10-CM

## 2023-06-15 ENCOUNTER — Ambulatory Visit: Payer: PPO | Admitting: Nurse Practitioner

## 2023-06-15 DIAGNOSIS — M5432 Sciatica, left side: Secondary | ICD-10-CM | POA: Insufficient documentation

## 2023-06-15 DIAGNOSIS — M25552 Pain in left hip: Secondary | ICD-10-CM | POA: Insufficient documentation

## 2023-06-15 DIAGNOSIS — M51369 Other intervertebral disc degeneration, lumbar region without mention of lumbar back pain or lower extremity pain: Secondary | ICD-10-CM | POA: Insufficient documentation

## 2023-06-23 ENCOUNTER — Telehealth: Payer: Self-pay | Admitting: Diagnostic Neuroimaging

## 2023-06-23 DIAGNOSIS — M6281 Muscle weakness (generalized): Secondary | ICD-10-CM | POA: Insufficient documentation

## 2023-06-23 DIAGNOSIS — M545 Low back pain, unspecified: Secondary | ICD-10-CM | POA: Insufficient documentation

## 2023-06-23 DIAGNOSIS — M5416 Radiculopathy, lumbar region: Secondary | ICD-10-CM | POA: Insufficient documentation

## 2023-06-23 NOTE — Telephone Encounter (Signed)
Pt cancelled appt due to having some medical issues,sciatica.

## 2023-06-26 DIAGNOSIS — M5416 Radiculopathy, lumbar region: Secondary | ICD-10-CM | POA: Diagnosis not present

## 2023-06-26 DIAGNOSIS — M545 Low back pain, unspecified: Secondary | ICD-10-CM | POA: Diagnosis not present

## 2023-06-26 DIAGNOSIS — M6281 Muscle weakness (generalized): Secondary | ICD-10-CM | POA: Diagnosis not present

## 2023-06-29 DIAGNOSIS — M545 Low back pain, unspecified: Secondary | ICD-10-CM | POA: Diagnosis not present

## 2023-06-29 DIAGNOSIS — M5416 Radiculopathy, lumbar region: Secondary | ICD-10-CM | POA: Diagnosis not present

## 2023-06-29 DIAGNOSIS — M6281 Muscle weakness (generalized): Secondary | ICD-10-CM | POA: Diagnosis not present

## 2023-07-02 DIAGNOSIS — M5416 Radiculopathy, lumbar region: Secondary | ICD-10-CM | POA: Diagnosis not present

## 2023-07-02 DIAGNOSIS — M545 Low back pain, unspecified: Secondary | ICD-10-CM | POA: Diagnosis not present

## 2023-07-02 DIAGNOSIS — M6281 Muscle weakness (generalized): Secondary | ICD-10-CM | POA: Diagnosis not present

## 2023-07-03 ENCOUNTER — Ambulatory Visit: Payer: PPO | Admitting: Diagnostic Neuroimaging

## 2023-07-07 DIAGNOSIS — M6281 Muscle weakness (generalized): Secondary | ICD-10-CM | POA: Diagnosis not present

## 2023-07-07 DIAGNOSIS — M545 Low back pain, unspecified: Secondary | ICD-10-CM | POA: Diagnosis not present

## 2023-07-07 DIAGNOSIS — M5416 Radiculopathy, lumbar region: Secondary | ICD-10-CM | POA: Diagnosis not present

## 2023-07-10 DIAGNOSIS — M545 Low back pain, unspecified: Secondary | ICD-10-CM | POA: Diagnosis not present

## 2023-07-10 DIAGNOSIS — M6281 Muscle weakness (generalized): Secondary | ICD-10-CM | POA: Diagnosis not present

## 2023-07-10 DIAGNOSIS — M5416 Radiculopathy, lumbar region: Secondary | ICD-10-CM | POA: Diagnosis not present

## 2023-07-13 DIAGNOSIS — M5416 Radiculopathy, lumbar region: Secondary | ICD-10-CM | POA: Diagnosis not present

## 2023-07-13 DIAGNOSIS — M6281 Muscle weakness (generalized): Secondary | ICD-10-CM | POA: Diagnosis not present

## 2023-07-13 DIAGNOSIS — M545 Low back pain, unspecified: Secondary | ICD-10-CM | POA: Diagnosis not present

## 2023-07-17 DIAGNOSIS — M6281 Muscle weakness (generalized): Secondary | ICD-10-CM | POA: Diagnosis not present

## 2023-07-17 DIAGNOSIS — M545 Low back pain, unspecified: Secondary | ICD-10-CM | POA: Diagnosis not present

## 2023-07-17 DIAGNOSIS — M5416 Radiculopathy, lumbar region: Secondary | ICD-10-CM | POA: Diagnosis not present

## 2023-07-23 DIAGNOSIS — M5416 Radiculopathy, lumbar region: Secondary | ICD-10-CM | POA: Diagnosis not present

## 2023-07-23 DIAGNOSIS — M545 Low back pain, unspecified: Secondary | ICD-10-CM | POA: Diagnosis not present

## 2023-07-23 DIAGNOSIS — M6281 Muscle weakness (generalized): Secondary | ICD-10-CM | POA: Diagnosis not present

## 2023-07-31 DIAGNOSIS — M6281 Muscle weakness (generalized): Secondary | ICD-10-CM | POA: Diagnosis not present

## 2023-07-31 DIAGNOSIS — M5416 Radiculopathy, lumbar region: Secondary | ICD-10-CM | POA: Diagnosis not present

## 2023-07-31 DIAGNOSIS — M545 Low back pain, unspecified: Secondary | ICD-10-CM | POA: Diagnosis not present

## 2023-08-07 DIAGNOSIS — M5432 Sciatica, left side: Secondary | ICD-10-CM | POA: Diagnosis not present

## 2023-08-07 DIAGNOSIS — M51369 Other intervertebral disc degeneration, lumbar region without mention of lumbar back pain or lower extremity pain: Secondary | ICD-10-CM | POA: Diagnosis not present

## 2023-08-07 DIAGNOSIS — M25552 Pain in left hip: Secondary | ICD-10-CM | POA: Diagnosis not present

## 2023-09-22 ENCOUNTER — Other Ambulatory Visit (HOSPITAL_BASED_OUTPATIENT_CLINIC_OR_DEPARTMENT_OTHER): Payer: Self-pay

## 2023-09-22 ENCOUNTER — Encounter (HOSPITAL_BASED_OUTPATIENT_CLINIC_OR_DEPARTMENT_OTHER): Payer: Self-pay | Admitting: Student

## 2023-09-22 ENCOUNTER — Ambulatory Visit (INDEPENDENT_AMBULATORY_CARE_PROVIDER_SITE_OTHER): Payer: PPO | Admitting: Student

## 2023-09-22 VITALS — BP 130/75 | HR 65 | Temp 97.8°F | Resp 16 | Ht 59.06 in | Wt 110.3 lb

## 2023-09-22 DIAGNOSIS — E538 Deficiency of other specified B group vitamins: Secondary | ICD-10-CM

## 2023-09-22 DIAGNOSIS — E782 Mixed hyperlipidemia: Secondary | ICD-10-CM | POA: Diagnosis not present

## 2023-09-22 DIAGNOSIS — Z131 Encounter for screening for diabetes mellitus: Secondary | ICD-10-CM

## 2023-09-22 DIAGNOSIS — T466X5A Adverse effect of antihyperlipidemic and antiarteriosclerotic drugs, initial encounter: Secondary | ICD-10-CM

## 2023-09-22 DIAGNOSIS — M791 Myalgia, unspecified site: Secondary | ICD-10-CM | POA: Insufficient documentation

## 2023-09-22 DIAGNOSIS — Z7689 Persons encountering health services in other specified circumstances: Secondary | ICD-10-CM | POA: Diagnosis not present

## 2023-09-22 DIAGNOSIS — K120 Recurrent oral aphthae: Secondary | ICD-10-CM | POA: Diagnosis not present

## 2023-09-22 DIAGNOSIS — M810 Age-related osteoporosis without current pathological fracture: Secondary | ICD-10-CM | POA: Diagnosis not present

## 2023-09-22 DIAGNOSIS — K219 Gastro-esophageal reflux disease without esophagitis: Secondary | ICD-10-CM | POA: Diagnosis not present

## 2023-09-22 DIAGNOSIS — E559 Vitamin D deficiency, unspecified: Secondary | ICD-10-CM

## 2023-09-22 MED ORDER — OMEPRAZOLE 20 MG PO CPDR
20.0000 mg | DELAYED_RELEASE_CAPSULE | Freq: Every day | ORAL | 3 refills | Status: AC
  Filled 2023-09-22: qty 90, 90d supply, fill #0
  Filled 2023-12-16: qty 90, 90d supply, fill #1
  Filled 2024-04-16: qty 90, 90d supply, fill #2

## 2023-09-22 MED ORDER — LIDOCAINE VISCOUS HCL 2 % MT SOLN
15.0000 mL | OROMUCOSAL | 1 refills | Status: AC | PRN
  Filled 2023-09-22: qty 100, 7d supply, fill #0

## 2023-09-22 NOTE — Assessment & Plan Note (Signed)
 Stable.  History of the same.  Reassess via labs today.  Continue current regimen of B12 supplementation.

## 2023-09-22 NOTE — Patient Instructions (Signed)
 It was nice to see you today!  As we discussed in clinic:  I will let you know what your labs are looking like, I do suspect that we should be starting a cholesterol lowering medication pretty soon.  If you have any problems before your next visit feel free to message me via MyChart (minor issues or questions) or call the office, otherwise you may reach out to schedule an office visit.  Thank you! Malyn Aytes, PA-C

## 2023-09-22 NOTE — Assessment & Plan Note (Signed)
 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 Currently managed with Prilosec. No recent issues with reflux or dysphagia since the procedure. - Send new prescription for Prilosec to [facility]

## 2023-09-22 NOTE — Assessment & Plan Note (Signed)
 Stable.  History of the same.  History of osteoporosis.  Reassess via labs today.  Continue current regimen of vitamin D  supplementation.

## 2023-09-22 NOTE — Assessment & Plan Note (Signed)
 Recurrent mouth ulcers since childhood. Previous treatment with lidocaine  solution for pain relief. No current ulcers observed during examination. - Prescribe lidocaine  solution for oral pain as needed

## 2023-09-22 NOTE — Progress Notes (Signed)
 New Patient Office Visit  Subjective    Patient ID: [name], female    DOB: July 14, 1950  Age: 73 y.o. MRN: [id number]  CC:  Chief Complaint  Patient presents with   Establish Care    Discussed the use of AI scribe software for clinical note transcription with the patient, who gave verbal consent to proceed.  History of Present Illness   [name] presents to establish care. Prior PCP was Warrick Habermann at [facility]. Last physical was November 2024.  She discontinued her cholesterol medication due to severe muscle pain in her legs and arms. Initially, she took the medication daily, then every other day, and finally twice a week, but the pain persisted until she stopped the medication entirely. Since discontinuing, the pain has resolved. She has a family history of high cholesterol and heart issues, with both parents affected. She has not started any new cholesterol medications since stopping the previous one.  She has been diagnosed with osteoporosis and is currently taking vitamin D3 daily. She has not started any osteoporosis-specific medication due to concerns about potential heart issues. She has a history of falls related to a 'sad nerve thing' which caused her leg to go numb, but physical therapy has helped regain sensation. She takes Tylenol  and Tramadol  for pain management, which she finds effective.  She experiences reflux and takes Prilosec daily. She had a history of severe indigestion and swallowing difficulties, which improved after an esophageal dilation procedure. She had emergency surgery in the past to remove nine inches of her intestines due to a twist and rupture.  She takes Trazodone  for sleep but reports inconsistent results, often waking after a few hours. She currently takes one pill but has been informed she can take up to two if needed.  She has a history of mouth ulcers since childhood and uses a lidocaine   solution for relief. She recently experienced an ulcer but it resolved last week.  She quit smoking a year and a half ago after smoking for many years, approximately a pack a week. She has not had any breathing issues and has not undergone low-dose CT scans for lung nodules.  She has a history of kidney stones, having passed forty to fifty stones in the past. She underwent a complete hysterectomy and has not taken hormone replacement therapy, experiencing no menopausal symptoms.  Her last colonoscopy revealed a couple of precancerous polyps which were removed. She also had a hernia discovered during an esophageal procedure.     Screenings:  Colon Cancer: Colonoscopy- 2024 (2 polyps) Lung Cancer: 1.5 years ago quit- about 1 pack per week Breast Cancer: 05/27/23- next in jan 2026 Diabetes: indicated HLD: indicated  The 10-year ASCVD risk score (Arnett DK, et al., 2019) is: 9%  Outpatient Encounter Medications as of 09/22/2023  Medication Sig   docusate sodium  (COLACE) 100 MG capsule Take 100 mg by mouth 2 (two) times daily.   lidocaine  (XYLOCAINE ) 2 % solution Use as directed 15 mLs in the mouth or throat as needed for mouth pain.   traZODone  (DESYREL ) 50 MG tablet TAKE 1 TO 1 AND 1/2 TABLETS BY MOUTH DAILY FOR SLEEP   zinc gluconate 50 MG tablet Take 50 mg by mouth daily.   [DISCONTINUED] omeprazole (PRILOSEC) 20 MG capsule Take 20 mg by mouth daily.   Acetaminophen  (TYLENOL  PO) Take by mouth.   cyclobenzaprine  (FLEXERIL ) 10 MG tablet Take 1 tablet (10 mg total) by mouth 3 (  three) times daily as needed for muscle spasms. (Patient not taking: Reported on 09/22/2023)   dexamethasone  (DECADRON ) 4 MG tablet Take 3 tabs for 3 days, 2 tabs for 3 days 1 tab for 5 days. Take with food. (Patient not taking: Reported on 09/22/2023)   HYDROcodone -acetaminophen  (NORCO/VICODIN) 5-325 MG tablet Take 1 tablet by mouth every 6 (six) hours as needed (pain). (Patient not taking: Reported on 09/22/2023)   omeprazole  (PRILOSEC) 20 MG capsule Take 1 capsule (20 mg total) by mouth daily.   rosuvastatin  (CRESTOR ) 5 MG tablet TAKE 1 TABLET(5 MG) BY MOUTH DAILY (Patient not taking: Reported on 09/22/2023)   No facility-administered encounter medications on file as of 09/22/2023.    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   Hemorrhoids    History of diverticulitis of colon    Hyperlipidemia    Kidney stones     Past Surgical History:  Procedure Laterality Date   BREAST CYST EXCISION     X2   COLONOSCOPY  nov 2011   KNEE SURGERY  07/2006   right   LAPAROSCOPY N/A 12/10/2016   Procedure: LAPAROSCOPY DIAGNOSTIC;  Surgeon: Oza Blumenthal, MD;  Location: [facility];  Service: General;  Laterality: N/A;   LAPAROTOMY N/A 12/10/2016   Procedure: POSSIBLE EXPLORATORY LAPAROTOMY, POSSIBLE SMALL BOWEL RESECTION;  Surgeon: Oza Blumenthal, MD;  Location: [facility];  Service: General;  Laterality: N/A;   VAGINAL HYSTERECTOMY     total    Family History  Problem Relation Age of Onset   Atrial fibrillation Mother    Thyroid  disease Mother    Alzheimer's disease Mother 35   Hypertension Father    Heart disease Father        heart attack   Multiple sclerosis Sister    Leukemia Paternal Uncle        leukemia   Leukemia Maternal Grandmother        leukemia   Leukemia Paternal Grandfather        leukemia   Breast cancer Neg Hx     Social History   Socioeconomic History   Marital status: Single    Spouse name: Not on file   Number of children: 1   Years of education: Not on file   Highest education level: Not on file  Occupational History   Occupation: Medical Receptionist  Tobacco Use   Smoking status: Former    Current packs/day: 0.00    Average packs/day: 0.1 packs/day for 45.0 years (5.4 ttl pk-yrs)    Types: Cigarettes    Start date: 33    Quit date: 2024    Years since quitting: 1.3   Smokeless tobacco: Never    Tobacco comments:    pack lasts 1 week, never more than 0.25 pack in a day  Vaping Use   Vaping status: Never Used  Substance and Sexual Activity   Alcohol use: No    Alcohol/week: 0.0 standard drinks of alcohol   Drug use: No   Sexual activity: Not Currently    Birth control/protection: None  Other Topics Concern   Not on file  Social History Narrative   Not on file   Social Drivers of Health   Financial Resource Strain: Low Risk  (09/22/2023)   Overall Financial Resource Strain (CARDIA)    Difficulty of Paying Living Expenses: Not very hard  Food Insecurity: No Food Insecurity (09/22/2023)   Hunger Vital Sign  Worried About Programme researcher, broadcasting/film/video in the Last Year: Never true    Ran Out of Food in the Last Year: Never true  Transportation Needs: No Transportation Needs (09/22/2023)   PRAPARE - Administrator, Civil Service (Medical): No    Lack of Transportation (Non-Medical): No  Physical Activity: Insufficiently Active (09/22/2023)   Exercise Vital Sign    Days of Exercise per Week: 2 days    Minutes of Exercise per Session: 30 min  Stress: No Stress Concern Present (09/22/2023)   [employer] of Occupational Health - Occupational Stress Questionnaire    Feeling of Stress : Not at all  Social Connections: Moderately Integrated (09/22/2023)   Social Connection and Isolation Panel [NHANES]    Frequency of Communication with Friends and Family: More than three times a week    Frequency of Social Gatherings with Friends and Family: More than three times a week    Attends Religious Services: More than 4 times per year    Active Member of [employer] or Organizations: Yes    Attends Banker Meetings: More than 4 times per year    Marital Status: Widowed  Intimate Partner Violence: Not At Risk (09/22/2023)   Humiliation, Afraid, Rape, and Kick questionnaire    Fear of Current or Ex-Partner: No    Emotionally Abused: No    Physically Abused: No    Sexually  Abused: No    ROS  Per HPI      Objective    BP 130/75   Pulse 65   Temp 97.8 F (36.6 C) (Oral)   Resp 16   Ht 4' 11.06" (1.5 m)   Wt 110 lb 4.8 oz (50 kg)   SpO2 96%   BMI 22.24 kg/m   Physical Exam      Assessment & Plan:   Encounter to establish care  Gastroesophageal reflux disease, unspecified whether esophagitis present Assessment & Plan: GERD with previous esophageal dilation due to dysphagia. Currently managed with Prilosec. No recent issues with reflux or dysphagia since the procedure. - Send new prescription for Pril[facility]c to [facility]  Orders: -     Omeprazole; Take 1 capsule (20 mg total) by mouth daily.  Dispense: 90 capsule; Refill: 3 -     CBC with Differential/Platelet -     Comprehensive metabolic panel with GFR  Canker sore Assessment & Plan: Recurrent mouth ulcers since childhood. Previous treatment with lidocaine  solution for pain relief. No current ulcers observed during examination. - Prescribe lidocaine  solution for oral pain as needed   Orders: -     Lidocaine  Viscous HCl; Use as directed 15 mLs in the mouth or throat as needed for mouth pain.  Dispense: 100 mL; Refill: 1  Vitamin D  deficiency Assessment & Plan: Stable.  History of the same.  History of osteoporosis.  Reassess via labs today.  Continue current regimen of vitamin D  supplementation.  Orders: -     VITAMIN D  25 Hydroxy (Vit-D Deficiency, Fractures)  B12 deficiency Assessment & Plan: Stable.  History of the same.  Reassess via labs today.  Continue current regimen of B12 supplementation.  Orders: -     Vitamin B12  Hyperlipidemia, mixed Assessment & Plan: Not at goal. High cholesterol with family history of cardiovascular disease. Previous statin therapy caused myalgia, leading to discontinuation. Discussed alternative medication, Zetia, which has less risk of myalgia but may not lower cholesterol as effectively as statins. She prefers to avoid statins due  to past side effects. Emphasized the importance of managing cholesterol due to family history of cardiovascular disease. - Order cholesterol panel to assess current levels off medication - Discuss starting Zetia if cholesterol levels remain elevated  Orders: -     Lipid panel  Screening for diabetes mellitus -     Hemoglobin A1c  Myalgia due to statin  Age-related osteoporosis without current pathological fracture Assessment & Plan: Diagnosed with osteoporosis. Currently taking vitamin D3 daily. Concerns about side effects of osteoporosis medications, particularly cardiovascular issues. Discussed the importance of medication in preventing fractures and the risks of not treating osteoporosis. She prefers to continue with vitamin D3 supplementation for now. - Continue vitamin D3 supplementation - Monitor bone health and reassess treatment options as needed - Patient still would not like to start fosamax.   Return in about 7 months (around 04/18/2024) for Chronic Followup, Annual Physical.   Established-seen at the [facility] urgent care in the past.  Laron Plummer Tmya Wigington, PA-C

## 2023-09-22 NOTE — Assessment & Plan Note (Signed)
 Diagnosed with osteoporosis. Currently taking vitamin D3 daily. Concerns about side effects of osteoporosis medications, particularly cardiovascular issues. Discussed the importance of medication in preventing fractures and the risks of not treating osteoporosis. She prefers to continue with vitamin D3 supplementation for now. - Continue vitamin D3 supplementation - Monitor bone health and reassess treatment options as needed - Patient still would not like to start fosamax.

## 2023-09-22 NOTE — Assessment & Plan Note (Signed)
 Not at goal. High cholesterol with family history of cardiovascular disease. Previous statin therapy caused myalgia, leading to discontinuation. Discussed alternative medication, Zetia, which has less risk of myalgia but may not lower cholesterol as effectively as statins. She prefers to avoid statins due to past side effects. Emphasized the importance of managing cholesterol due to family history of cardiovascular disease. - Order cholesterol panel to assess current levels off medication - Discuss starting Zetia if cholesterol levels remain elevated

## 2023-09-23 ENCOUNTER — Other Ambulatory Visit (HOSPITAL_BASED_OUTPATIENT_CLINIC_OR_DEPARTMENT_OTHER): Payer: Self-pay | Admitting: Student

## 2023-09-23 ENCOUNTER — Encounter (HOSPITAL_BASED_OUTPATIENT_CLINIC_OR_DEPARTMENT_OTHER): Payer: Self-pay | Admitting: Student

## 2023-09-23 ENCOUNTER — Other Ambulatory Visit (HOSPITAL_BASED_OUTPATIENT_CLINIC_OR_DEPARTMENT_OTHER): Payer: Self-pay

## 2023-09-23 DIAGNOSIS — M791 Myalgia, unspecified site: Secondary | ICD-10-CM

## 2023-09-23 DIAGNOSIS — E7800 Pure hypercholesterolemia, unspecified: Secondary | ICD-10-CM

## 2023-09-23 LAB — COMPREHENSIVE METABOLIC PANEL WITH GFR
ALT: 10 IU/L (ref 0–32)
AST: 15 IU/L (ref 0–40)
Albumin: 4.6 g/dL (ref 3.9–4.9)
Alkaline Phosphatase: 111 IU/L (ref 44–121)
BUN/Creatinine Ratio: 17 (ref 12–28)
BUN: 12 mg/dL (ref 8–27)
Bilirubin Total: 0.2 mg/dL (ref 0.0–1.2)
CO2: 22 mmol/L (ref 20–29)
Calcium: 9.6 mg/dL (ref 8.7–10.3)
Chloride: 103 mmol/L (ref 96–106)
Creatinine, Ser: 0.7 mg/dL (ref 0.57–1.00)
Globulin, Total: 2.1 g/dL (ref 1.5–4.5)
Glucose: 88 mg/dL (ref 70–99)
Potassium: 4.6 mmol/L (ref 3.5–5.2)
Sodium: 140 mmol/L (ref 134–144)
Total Protein: 6.7 g/dL (ref 6.0–8.5)
eGFR: 93 mL/min/{1.73_m2} (ref >59)

## 2023-09-23 LAB — VITAMIN D 25 HYDROXY (VIT D DEFICIENCY, FRACTURES): Vit D, 25-Hydroxy: 72.1 ng/mL (ref 30.0–100.0)

## 2023-09-23 LAB — CBC WITH DIFFERENTIAL/PLATELET
Basophils Absolute: 0.1 10*3/uL (ref 0.0–0.2)
Basos: 2 %
EOS (ABSOLUTE): 0 10*3/uL (ref 0.0–0.4)
Eos: 1 %
Hematocrit: 42.5 % (ref 34.0–46.6)
Hemoglobin: 13.5 g/dL (ref 11.1–15.9)
Immature Grans (Abs): 0 10*3/uL (ref 0.0–0.1)
Immature Granulocytes: 0 %
Lymphocytes Absolute: 1.5 10*3/uL (ref 0.7–3.1)
Lymphs: 29 %
MCH: 29 pg (ref 26.6–33.0)
MCHC: 31.8 g/dL (ref 31.5–35.7)
MCV: 91 fL (ref 79–97)
Monocytes Absolute: 0.6 10*3/uL (ref 0.1–0.9)
Monocytes: 11 %
Neutrophils Absolute: 2.9 10*3/uL (ref 1.4–7.0)
Neutrophils: 57 %
Platelets: 278 10*3/uL (ref 150–450)
RBC: 4.65 x10E6/uL (ref 3.77–5.28)
RDW: 12.1 % (ref 11.7–15.4)
WBC: 5.1 10*3/uL (ref 3.4–10.8)

## 2023-09-23 LAB — SPECIMEN STATUS REPORT

## 2023-09-23 LAB — LIPID PANEL
Chol/HDL Ratio: 3.5 ratio (ref 0.0–4.4)
Cholesterol, Total: 287 mg/dL — ABNORMAL HIGH (ref 100–199)
HDL: 82 mg/dL (ref >39)
LDL Chol Calc (NIH): 181 mg/dL — ABNORMAL HIGH (ref 0–99)
Triglycerides: 135 mg/dL (ref 0–149)
VLDL Cholesterol Cal: 24 mg/dL (ref 5–40)

## 2023-09-23 LAB — HEMOGLOBIN A1C
Est. average glucose Bld gHb Est-mCnc: 105 mg/dL
Hgb A1c MFr Bld: 5.3 % (ref 4.8–5.6)

## 2023-09-23 LAB — VITAMIN B12: Vitamin B-12: 540 pg/mL (ref 232–1245)

## 2023-09-23 MED ORDER — EZETIMIBE 10 MG PO TABS
10.0000 mg | ORAL_TABLET | Freq: Every day | ORAL | 6 refills | Status: DC
  Filled 2023-09-23: qty 30, 30d supply, fill #0
  Filled 2023-10-22: qty 30, 30d supply, fill #1
  Filled 2023-11-20: qty 30, 30d supply, fill #2
  Filled 2023-12-16: qty 30, 30d supply, fill #3
  Filled 2024-01-19: qty 30, 30d supply, fill #4
  Filled 2024-02-16: qty 30, 30d supply, fill #5
  Filled 2024-03-15: qty 30, 30d supply, fill #6

## 2023-09-23 MED ORDER — EZETIMIBE 10 MG PO TABS: 10.0000 mg | ORAL_TABLET | Freq: Every day | ORAL | 6 refills | Status: DC

## 2023-09-25 ENCOUNTER — Encounter: Payer: PPO | Admitting: Nurse Practitioner

## 2023-10-05 ENCOUNTER — Telehealth (HOSPITAL_BASED_OUTPATIENT_CLINIC_OR_DEPARTMENT_OTHER): Payer: Self-pay | Admitting: Student

## 2023-10-05 ENCOUNTER — Ambulatory Visit: Payer: Self-pay

## 2023-10-05 NOTE — Telephone Encounter (Signed)
 Copied from CRM [id number]. Topic: Clinical - Medication Question >> Oct 02, 2023  1:02 PM Santiya F wrote: Reason for CRM: Patient is calling in because her hemorrhoids are bothering her. Patient says she went to the pharmacy for suppositories and cream and the pharmacist recommended she contact her doctor to see if he would write her a prescription. Patient wants to know if she can have a prescription for cream and suppositories.

## 2023-10-05 NOTE — Telephone Encounter (Signed)
 Copied from CRM [id number]. Topic: Clinical - Medical Advice >> Oct 05, 2023 11:10 AM Kevelyn M wrote: Reason for CRM: Patient is following up and calling about a cream and suppository prescription from Friday. Please advise. [phone].   Answer Assessment - Initial Assessment Questions 1. REASON FOR CALL or QUESTION: "What is your reason for calling today?" or "How can I best help you?" or "What question do you have that I can help answer?" Pt called to check the status of requested medication for cream and suppositories related to hemorrhoids. Called pt back to  make aware all medications take up to 3 days to be filled or refilled therefore at this time I can only inform her that the request was sent. [facility] in [place] is the patient's new pharmacy location for all medication.  Pt stated she just wanted to check due to going out of town for a couple of days.  Protocols used: Information Only Call - No Triage-A-AH

## 2023-10-08 ENCOUNTER — Encounter (HOSPITAL_BASED_OUTPATIENT_CLINIC_OR_DEPARTMENT_OTHER): Payer: Self-pay | Admitting: Student

## 2023-10-08 ENCOUNTER — Telehealth (INDEPENDENT_AMBULATORY_CARE_PROVIDER_SITE_OTHER): Admitting: Student

## 2023-10-08 DIAGNOSIS — K644 Residual hemorrhoidal skin tags: Secondary | ICD-10-CM

## 2023-10-08 DIAGNOSIS — R195 Other fecal abnormalities: Secondary | ICD-10-CM

## 2023-10-08 NOTE — Progress Notes (Unsigned)
   Virtual Visit via Video Note  I connected with [name] on 10/08/23 at  2:10 PM EDT by a video enabled telemedicine application and verified that I am speaking with the correct person using two identifiers.  {Patient [phone]::"Home"} {Provider Location:[phone]::"Home Office"}  I discussed the limitations, risks, security, and privacy concerns of performing an evaluation and management service by video and the availability of in person appointments. I also discussed with the patient that there may be a patient responsible charge related to this service. The patient expressed understanding and agreed to proceed.  Subjective: PCP: Lenzie Montesano, Laron Plummer, PA-C  Chief Complaint  Patient presents with   Bowel Movement Issues    Hasnot taking bloody stools but her cholesterol medicine has been making her use the bathroom more often. Going 3-4 x a day. Thinks it flared up Hemorrhoids. Pt got suppository and some type of hemorrhoid cream w/ lidocaine  and said it was expensive. She paid $40. Pharmacist advise she gets PCP to call something. Only has 2 suppositories left.    HPI  Denies bloody stools. Some itching, and pain.     ROS: Per HPI  Current Outpatient Medications:    Acetaminophen  (TYLENOL  PO), Take by mouth., Disp: , Rfl:    Cholecalciferol (VITAMIN D -3 PO), Take 50 mcg by mouth daily., Disp: , Rfl:    cyclobenzaprine  (FLEXERIL ) 10 MG tablet, Take 1 tablet (10 mg total) by mouth 3 (three) times daily as needed for muscle spasms., Disp: 30 tablet, Rfl: 0   docusate sodium  (COLACE) 100 MG capsule, Take 100 mg by mouth 2 (two) times daily., Disp: , Rfl:    ezetimibe  (ZETIA ) 10 MG tablet, Take 1 tablet (10 mg total) by mouth daily., Disp: 30 tablet, Rfl: 6   lidocaine  (XYLOCAINE ) 2 % solution, Use as directed 15 mLs in the mouth or throat as needed for mouth pain., Disp: 100 mL, Rfl: 1   omeprazole  (PRILOSEC) 20 MG capsule, Take 1 capsule (20 mg total) by mouth  daily., Disp: 90 capsule, Rfl: 3   rosuvastatin  (CRESTOR ) 5 MG tablet, TAKE 1 TABLET(5 MG) BY MOUTH DAILY, Disp: 90 tablet, Rfl: 3   traZODone  (DESYREL ) 50 MG tablet, TAKE 1 TO 1 AND 1/2 TABLETS BY MOUTH DAILY FOR SLEEP, Disp: 135 tablet, Rfl: 1   zinc gluconate 50 MG tablet, Take 50 mg by mouth daily., Disp: , Rfl:   Observations/Objective: There were no vitals filed for this visit. Physical Exam  Assessment and Plan: There are no diagnoses linked to this encounter.  Follow Up Instructions: No follow-ups on file.   I discussed the assessment and treatment plan with the patient. The patient was provided an opportunity to ask questions, and all were answered. The patient agreed with the plan and demonstrated an understanding of the instructions.   The patient was advised to call back or seek an in-person evaluation if the symptoms worsen or if the condition fails to improve as anticipated.  The above assessment and management plan was discussed with the patient. The patient verbalized understanding of and has agreed to the management plan.   Kaida [employer] T Tekeya Geffert, PA-C

## 2023-10-08 NOTE — Patient Instructions (Signed)
 It was nice to see you today!  As we discussed in clinic:  I have called you in imodium to firm up your stools.  I have also called in an all in one rectal cream to apply up to 7 days. If you need anything past this, please message me.  If you have any problems before your next visit feel free to message me via MyChart (minor issues or questions) or call the office, otherwise you may reach out to schedule an office visit.  Thank you! Uzziel Russey, PA-C

## 2023-10-09 ENCOUNTER — Other Ambulatory Visit (HOSPITAL_BASED_OUTPATIENT_CLINIC_OR_DEPARTMENT_OTHER): Payer: Self-pay

## 2023-10-09 ENCOUNTER — Other Ambulatory Visit (INDEPENDENT_AMBULATORY_CARE_PROVIDER_SITE_OTHER): Payer: Self-pay | Admitting: Student

## 2023-10-09 DIAGNOSIS — K644 Residual hemorrhoidal skin tags: Secondary | ICD-10-CM

## 2023-10-09 MED ORDER — HYDROCORTISONE ACETATE 25 MG RE SUPP
25.0000 mg | Freq: Three times a day (TID) | RECTAL | 4 refills | Status: AC
  Filled 2023-10-09: qty 20, 7d supply, fill #0

## 2023-10-09 MED ORDER — HYDROCORTISONE (PERIANAL) 2.5 % EX CREA
1.0000 | TOPICAL_CREAM | Freq: Three times a day (TID) | CUTANEOUS | 2 refills | Status: AC
  Filled 2023-10-09: qty 30, 10d supply, fill #0

## 2023-10-22 ENCOUNTER — Other Ambulatory Visit (HOSPITAL_BASED_OUTPATIENT_CLINIC_OR_DEPARTMENT_OTHER): Payer: Self-pay

## 2023-10-28 ENCOUNTER — Ambulatory Visit: Payer: Self-pay

## 2023-10-28 ENCOUNTER — Ambulatory Visit (HOSPITAL_BASED_OUTPATIENT_CLINIC_OR_DEPARTMENT_OTHER)
Admission: RE | Admit: 2023-10-28 | Discharge: 2023-10-28 | Disposition: A | Discharge status: Home / Self Care | Source: Ambulatory Visit | Attending: Student | Admitting: Student

## 2023-10-28 ENCOUNTER — Ambulatory Visit (HOSPITAL_BASED_OUTPATIENT_CLINIC_OR_DEPARTMENT_OTHER): Payer: Self-pay | Admitting: Student

## 2023-10-28 ENCOUNTER — Encounter (HOSPITAL_BASED_OUTPATIENT_CLINIC_OR_DEPARTMENT_OTHER): Payer: Self-pay | Admitting: Student

## 2023-10-28 ENCOUNTER — Ambulatory Visit (INDEPENDENT_AMBULATORY_CARE_PROVIDER_SITE_OTHER): Admitting: Student

## 2023-10-28 ENCOUNTER — Other Ambulatory Visit (HOSPITAL_BASED_OUTPATIENT_CLINIC_OR_DEPARTMENT_OTHER): Payer: Self-pay

## 2023-10-28 VITALS — BP 112/48 | HR 83 | Temp 98.5°F | Resp 16 | Ht 59.0 in | Wt 111.4 lb

## 2023-10-28 DIAGNOSIS — R0989 Other specified symptoms and signs involving the circulatory and respiratory systems: Secondary | ICD-10-CM | POA: Diagnosis not present

## 2023-10-28 DIAGNOSIS — R6889 Other general symptoms and signs: Secondary | ICD-10-CM | POA: Diagnosis not present

## 2023-10-28 DIAGNOSIS — J0100 Acute maxillary sinusitis, unspecified: Secondary | ICD-10-CM | POA: Diagnosis not present

## 2023-10-28 LAB — POCT RAPID STREP A (OFFICE): Rapid Strep A Screen: NEGATIVE

## 2023-10-28 LAB — POC COVID19 BINAXNOW: SARS Coronavirus 2 Ag: NEGATIVE

## 2023-10-28 LAB — POCT INFLUENZA A/B
Influenza A, POC: NEGATIVE
Influenza B, POC: NEGATIVE

## 2023-10-28 MED ORDER — AMOXICILLIN-POT CLAVULANATE 875-125 MG PO TABS
1.0000 | ORAL_TABLET | Freq: Two times a day (BID) | ORAL | 0 refills | Status: AC
  Filled 2023-10-28: qty 14, 7d supply, fill #0

## 2023-10-28 NOTE — Telephone Encounter (Signed)
 Copied from CRM [id number]. Topic: Clinical - Red Word Triage >> Oct 28, 2023  1:08 PM Turkey A wrote: [facility] that prompted transfer to Nurse Triage: Patient has sore throat, hard time swallowing, congestion, sneezing and coughing. Patient has had symptoms since 3:00AM.   Reason for Disposition  [1] Sore throat with cough/cold symptoms AND [2] present < 5 days    Appointment made per patient's request  Answer Assessment - Initial Assessment Questions 1. ONSET: When did the throat start hurting? (Hours or days ago)      Yesterday  2. SEVERITY: How bad is the sore throat? (Scale 1-10; mild, moderate or severe)   - MILD (1-3):  Doesn't interfere with eating or normal activities.   - MODERATE (4-7): Interferes with eating some solids and normal activities.   - SEVERE (8-10):  Excruciating pain, interferes with most normal activities.   - SEVERE WITH DYSPHAGIA (10): Can't swallow liquids, drooling.     Moderate 3. STREP EXPOSURE: Has there been any exposure to strep within the past week? If Yes, ask: What type of contact occurred?      No 4.  VIRAL SYMPTOMS: Are there any symptoms of a cold, such as a runny nose, cough, hoarse voice or red eyes?      Congestion, cough  5. FEVER: Do you have a fever? If Yes, ask: What is your temperature, how was it measured, and when did it start?     No 6. PUS ON THE TONSILS: Is there pus on the tonsils in the back of your throat?     No 7. OTHER SYMPTOMS: Do you have any other symptoms? (e.g., difficulty breathing, headache, rash)     No  Protocols used: Sore Throat-A-AH   FYI Only or Action Required?: FYI only for provider  Patient was last seen in primary care on 06/03/2023 by Stephane Ee, FNP. Called Nurse Triage reporting Sore Throat. Symptoms began yesterday. Interventions attempted: Nothing. Symptoms are: unchanged.  Triage Disposition: Home Care  Patient/caregiver understands and will follow disposition?:  Yes  Appointment today per patient's request.

## 2023-10-28 NOTE — Progress Notes (Signed)
 Acute Office Visit  Subjective:     Patient ID: [name], female    DOB: 1950-07-22, 73 y.o.   MRN: [id number]  Chief Complaint  Patient presents with   Cough    Cough, sore throat, and congestion since 2:30am. Throat felt like she could not swallow. Felt like it happened over night. Took some extra strength tylenol  every 6 hours. Runny nose.    Discussed the use of AI scribe software for clinical note transcription with the patient, who gave verbal consent to proceed.  History of Present Illness   [name] is a 73 year old female who presents with a sore throat and congestion.  She began experiencing symptoms yesterday morning, including difficulty swallowing and a sensation of choking due to congestion. She has frequent sneezing, coughing, and nasal discharge. No loss of smell or taste, and no fever.  She describes significant body aches and a headache, especially when pressure is applied to her head. The sore throat is particularly bothersome and unusual for her.  She has been using extra strength Tylenol  to manage her symptoms. She is concerned about having a head cold or COVID, especially with an upcoming vacation next week.  She feels cold and has been covering up more than usual, but denies sweating. She also has a fever blister on the side of her mouth, which she is self-treating.  She is currently taking metoprolol and has been using extra strength Tylenol  to manage her symptoms.  She is planning a vacation next week to [place] in [place] with her daughter, son, and grandson, which is an annual family trip.       Review of Systems  Respiratory:  Positive for cough.    Per HPI     Objective:    BP (!) 112/48   Pulse 83   Temp 98.5 F (36.9 C) (Oral)   Resp 16   Ht 4' 11 (1.499 m)   Wt 111 lb 6.4 oz (50.5 kg)   SpO2 96%   BMI 22.50 kg/m  BP Readings from Last 3 Encounters:  10/28/23 (!) 112/48  09/22/23 130/75  06/03/23 110/62    Wt Readings from Last 3 Encounters:  10/28/23 111 lb 6.4 oz (50.5 kg)  09/22/23 110 lb 4.8 oz (50 kg)  06/03/23 112 lb 3.2 oz (50.9 kg)      Physical Exam Constitutional:      General: She is not in acute distress.    Appearance: Normal appearance. She is not ill-appearing or toxic-appearing.  HENT:     Head: Normocephalic and atraumatic.     Comments: Pain to maxillary sinus    Nose: Nose normal.  Eyes:     General: No scleral icterus.    Conjunctiva/sclera: Conjunctivae normal.  Cardiovascular:     Rate and Rhythm: Normal rate and regular rhythm.     Heart sounds: Normal heart sounds. No murmur heard.    No friction rub.  Pulmonary:     Effort: Pulmonary effort is normal. No respiratory distress.     Breath sounds: Rhonchi (left lung base) present. No wheezing or rales.     Comments: Very mild abnormality on left lower lung and may be baseline due to history of smoking Musculoskeletal:        General: Normal range of motion.  Skin:    General: Skin is warm and dry.     Coloration: Skin is not jaundiced or pale.  Neurological:     General: No  focal deficit present.     Mental Status: She is alert.  Psychiatric:        Mood and Affect: Mood normal.        Behavior: Behavior normal.     Results for orders placed or performed in visit on 10/28/23  POC COVID-19 BinaxNow  Result Value Ref Range   SARS Coronavirus 2 Ag Negative Negative  POCT Influenza A/B  Result Value Ref Range   Influenza A, POC Negative Negative   Influenza B, POC Negative Negative  POCT rapid strep A  Result Value Ref Range   Rapid Strep A Screen Negative Negative        Assessment & Plan:   Assessment and Plan    Sinus Infection Presents with symptoms of sore throat, congestion, and body aches, suggestive of a sinus infection. At age 25, there is an increased risk for complications, warranting antibiotic treatment despite the typically self-limiting nature of sinus infections. -  Prescribe Augmentin  - Recommend nasal saline for congestion - Advise use of Sudafed for congestion for no more than three days - Advise use of Tylenol  for throat pain  Possible Pneumonia Mild crackles in the lungs raise concern for pneumonia, though absence of fever and overall clinical presentation make it less likely. A chest x-ray is warranted to rule out pneumonia and ensure no early signs are missed. - Order chest x-ray  Return if symptoms worsen or fail to improve.  Diamon Reddinger T Elisandro Jarrett, PA-C

## 2023-10-29 ENCOUNTER — Other Ambulatory Visit (HOSPITAL_BASED_OUTPATIENT_CLINIC_OR_DEPARTMENT_OTHER): Payer: Self-pay

## 2023-11-23 ENCOUNTER — Other Ambulatory Visit: Payer: PRIVATE HEALTH INSURANCE

## 2023-11-23 ENCOUNTER — Other Ambulatory Visit (HOSPITAL_BASED_OUTPATIENT_CLINIC_OR_DEPARTMENT_OTHER): Payer: Self-pay

## 2023-12-17 ENCOUNTER — Other Ambulatory Visit (HOSPITAL_BASED_OUTPATIENT_CLINIC_OR_DEPARTMENT_OTHER): Payer: Self-pay

## 2023-12-17 ENCOUNTER — Other Ambulatory Visit (HOSPITAL_BASED_OUTPATIENT_CLINIC_OR_DEPARTMENT_OTHER): Payer: Self-pay | Admitting: Student

## 2023-12-17 NOTE — Telephone Encounter (Unsigned)
 Copied from CRM #[id number]. Topic: Clinical - Medication Refill >> Dec 17, 2023 10:28 AM [name] wrote: Medication: Trazodone  50 mg [name] told her to take 2 at night  Has the patient contacted their pharmacy? No (Agent: If no, request that the patient contact the pharmacy for the refill. If patient does not wish to contact the pharmacy document the reason why and proceed with request.) (Agent: If yes, when and what did the pharmacy advise?)  This is the patient's preferred pharmacy:  [facility] [address], [address] [place] [address] Phone: [phone] Fax: [phone]  Is this the correct pharmacy for this prescription? Yes If no, delete pharmacy and type the correct one.   Has the prescription been filled recently? Yes  Is the patient out of the medication? No  Has the patient been seen for an appointment in the last year OR does the patient have an upcoming appointment? Yes  Can we respond through MyChart? No  Agent: Please be advised that Rx refills may take up to 3 business days. We ask that you follow-up with your pharmacy.

## 2023-12-21 ENCOUNTER — Other Ambulatory Visit (HOSPITAL_BASED_OUTPATIENT_CLINIC_OR_DEPARTMENT_OTHER): Payer: Self-pay

## 2023-12-21 ENCOUNTER — Other Ambulatory Visit: Payer: Self-pay | Admitting: Student

## 2023-12-21 DIAGNOSIS — F4321 Adjustment disorder with depressed mood: Secondary | ICD-10-CM

## 2023-12-22 ENCOUNTER — Other Ambulatory Visit (HOSPITAL_BASED_OUTPATIENT_CLINIC_OR_DEPARTMENT_OTHER): Payer: Self-pay

## 2023-12-22 MED ORDER — TRAZODONE HCL 50 MG PO TABS
50.0000 mg | ORAL_TABLET | Freq: Every evening | ORAL | 1 refills | Status: AC
  Filled 2023-12-22: qty 180, 90d supply, fill #0
  Filled 2024-02-16 – 2024-03-15 (×2): qty 180, 90d supply, fill #1

## 2024-01-19 ENCOUNTER — Other Ambulatory Visit (HOSPITAL_BASED_OUTPATIENT_CLINIC_OR_DEPARTMENT_OTHER): Payer: Self-pay

## 2024-02-16 ENCOUNTER — Other Ambulatory Visit (HOSPITAL_BASED_OUTPATIENT_CLINIC_OR_DEPARTMENT_OTHER): Payer: Self-pay

## 2024-02-22 DIAGNOSIS — H16223 Keratoconjunctivitis sicca, not specified as Sjogren's, bilateral: Secondary | ICD-10-CM | POA: Diagnosis not present

## 2024-02-22 DIAGNOSIS — H18519 Endothelial corneal dystrophy, unspecified eye: Secondary | ICD-10-CM | POA: Diagnosis not present

## 2024-02-22 DIAGNOSIS — H26493 Other secondary cataract, bilateral: Secondary | ICD-10-CM | POA: Diagnosis not present

## 2024-02-24 ENCOUNTER — Encounter (HOSPITAL_BASED_OUTPATIENT_CLINIC_OR_DEPARTMENT_OTHER): Payer: Self-pay

## 2024-02-24 ENCOUNTER — Ambulatory Visit (HOSPITAL_BASED_OUTPATIENT_CLINIC_OR_DEPARTMENT_OTHER)

## 2024-02-24 VITALS — BP 112/68 | Ht 59.0 in | Wt 112.0 lb

## 2024-02-24 DIAGNOSIS — Z0000 Encounter for general adult medical examination without abnormal findings: Secondary | ICD-10-CM

## 2024-02-24 NOTE — Patient Instructions (Signed)
 [name],  Thank you for taking the time for your Medicare Wellness Visit. I appreciate your continued commitment to your health goals. Please review the care plan we discussed, and feel free to reach out if I can assist you further.  Medicare recommends these wellness visits once per year to help you and your care team stay ahead of potential health issues. These visits are designed to focus on prevention, allowing your provider to concentrate on managing your acute and chronic conditions during your regular appointments.  Please note that Annual Wellness Visits do not include a physical exam. Some assessments may be limited, especially if the visit was conducted virtually. If needed, we may recommend a separate in-person follow-up with your provider.  Ongoing Care Seeing your primary care provider every 3 to 6 months helps us  monitor your health and provide consistent, personalized care.   Referrals If a referral was made during today's visit and you haven't received any updates within two weeks, please contact the referred provider directly to check on the status.  Recommended Screenings:  Health Maintenance  Topic Date Due   Flu Shot  12/18/2023   COVID-19 Vaccine (6 - 2025-26 season) 01/18/2024   Medicare Annual Wellness Visit  02/23/2025   Breast Cancer Screening  05/26/2025   DTaP/Tdap/Td vaccine (4 - Td or Tdap) 03/29/2031   Colon Cancer Screening  07/15/2032   Pneumococcal Vaccine for age over 61  Completed   DEXA scan (bone density measurement)  Completed   Zoster (Shingles) Vaccine  Completed   Meningitis B Vaccine  Aged Out   Hepatitis C Screening  Discontinued       02/24/2024    9:34 AM  Advanced Directives  Does Patient Have a Medical Advance Directive? No  Would patient like information on creating a medical advance directive? No - Patient declined   Advance Care Planning is important because it: Ensures you receive medical care that aligns with your values,  goals, and preferences. Provides guidance to your family and loved ones, reducing the emotional burden of decision-making during critical moments.  Vision: Annual vision screenings are recommended for early detection of glaucoma, cataracts, and diabetic retinopathy. These exams can also reveal signs of chronic conditions such as diabetes and high blood pressure.  Dental: Annual dental screenings help detect early signs of oral cancer, gum disease, and other conditions linked to overall health, including heart disease and diabetes.  Please see the attached documents for additional preventive care recommendations.

## 2024-02-24 NOTE — Progress Notes (Signed)
 Because this visit was a virtual/telehealth visit,  certain criteria was not obtained, such a blood pressure, CBG if applicable, and timed get up and go. Any medications not marked as taking were not mentioned during the medication reconciliation part of the visit. Any vitals not documented were not able to be obtained due to this being a telehealth visit or patient was unable to self-report a recent blood pressure reading due to a lack of equipment at home via telehealth. Vitals that have been documented are verbally provided by the patient.  This visit was performed by a medical professional under my direct supervision. I was immediately available for consultation/collaboration. I have reviewed and agree with the Annual Wellness Visit documentation.  Subjective:   [name] is a 73 y.o. who presents for a Medicare Wellness preventive visit.  As a reminder, Annual Wellness Visits don't include a physical exam, and some assessments may be limited, especially if this visit is performed virtually. We may recommend an in-person follow-up visit with your provider if needed.  Visit Complete: Virtual I connected with  [name] on 02/24/24 by a audio enabled telemedicine application and verified that I am speaking with the correct person using two identifiers.  Patient Location: Home  Provider Location: Home Office  I discussed the limitations of evaluation and management by telemedicine. The patient expressed understanding and agreed to proceed.  Vital Signs: Because this visit was a virtual/telehealth visit, some criteria may be missing or patient reported. Any vitals not documented were not able to be obtained and vitals that have been documented are patient reported.  VideoDeclined- This patient declined Librarian, academic. Therefore the visit was completed with audio only.  Persons Participating in Visit: Patient.  AWV Questionnaire: No: Patient  Medicare AWV questionnaire was not completed prior to this visit.  Cardiac Risk Factors include: advanced age (>44men, >39 women);dyslipidemia     Objective:    Today's Vitals   02/24/24 0934  BP: 112/68  Weight: 112 lb (50.8 kg)  Height: 4' 11 (1.499 m)   Body mass index is 22.62 kg/m.     02/24/2024    9:34 AM 04/02/2023   11:38 AM 03/31/2022   11:34 AM 03/28/2021   11:43 AM 09/08/2019    9:19 AM 09/03/2018    9:03 AM 03/31/2018    9:58 AM  Advanced Directives  Does Patient Have a Medical Advance Directive? No No No No No No No   Would patient like information on creating a medical advance directive? No - Patient declined No - Patient declined No - Patient declined No - Patient declined No - Patient declined No - Patient declined  Yes (MAU/Ambulatory/Procedural Areas - Information given)      Data saved with a previous flowsheet row definition    Current Medications (verified) Outpatient Encounter Medications as of 02/24/2024  Medication Sig   Acetaminophen  (TYLENOL  PO) Take by mouth.   Cholecalciferol (VITAMIN D -3 PO) Take 50 mcg by mouth daily.   docusate sodium  (COLACE) 100 MG capsule Take 100 mg by mouth as needed.   ezetimibe  (ZETIA ) 10 MG tablet Take 1 tablet (10 mg total) by mouth daily.   hydrocortisone  (ANUSOL -HC) 25 MG suppository Place 1 suppository (25 mg total) rectally 3 (three) times daily. Do not use for more than 7 days.   hydrocortisone  (PROCTOZONE -HC) 2.5 % rectal cream Apply 1 Application topically 3 (three) times daily. Do not use for more than 7 days in a row.  lidocaine  (XYLOCAINE ) 2 % solution Use as directed 15 mLs in the mouth or throat as needed for mouth pain.   omeprazole  (PRILOSEC) 20 MG capsule Take 1 capsule (20 mg total) by mouth daily.   traZODone  (DESYREL ) 50 MG tablet Take 1-2 tablets (50-100 mg total) by mouth nightly for sleep   zinc gluconate 50 MG tablet Take 50 mg by mouth daily.   No facility-administered encounter medications on  file as of 02/24/2024.    Allergies (verified) Patient has no known allergies.   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   Hemorrhoids    History of diverticulitis of colon    Hyperlipidemia    Kidney stones    Past Surgical History:  Procedure Laterality Date   BREAST CYST EXCISION     X2   COLONOSCOPY  nov 2011   KNEE SURGERY  07/2006   right   LAPAROSCOPY N/A 12/10/2016   Procedure: LAPAROSCOPY DIAGNOSTIC;  Surgeon: Vernetta Berg, MD;  Location: [facility];  Service: General;  Laterality: N/A;   LAPAROTOMY N/A 12/10/2016   Procedure: POSSIBLE EXPLORATORY LAPAROTOMY, POSSIBLE SMALL BOWEL RESECTION;  Surgeon: Vernetta Berg, MD;  Location: [facility];  Service: General;  Laterality: N/A;   VAGINAL HYSTERECTOMY     total   Family History  Problem Relation Age of Onset   Atrial fibrillation Mother    Thyroid  disease Mother    Alzheimer's disease Mother 85   Hypertension Father    Heart disease Father        heart attack   Multiple sclerosis Sister    Leukemia Paternal Uncle        leukemia   Leukemia Maternal Grandmother        leukemia   Leukemia Paternal Grandfather        leukemia   Breast cancer Neg Hx    Social History   Socioeconomic History   Marital status: Single    Spouse name: Not on file   Number of children: 1   Years of education: Not on file   Highest education level: Not on file  Occupational History   Occupation: Medical Receptionist  Tobacco Use   Smoking status: Former    Current packs/day: 0.00    Average packs/day: 0.1 packs/day for 45.0 years (5.4 ttl pk-yrs)    Types: Cigarettes    Start date: 67    Quit date: 2024    Years since quitting: 1.7    Passive exposure: Past   Smokeless tobacco: Never   Tobacco comments:    pack lasts 1 week, never more than 0.25 pack in a day  Vaping Use   Vaping status: Never Used  Substance and Sexual  Activity   Alcohol use: No    Alcohol/week: 0.0 standard drinks of alcohol   Drug use: No   Sexual activity: Not Currently    Birth control/protection: None  Other Topics Concern   Not on file  Social History Narrative   Not on file   Social Drivers of Health   Financial Resource Strain: Low Risk  (02/24/2024)   Overall Financial Resource Strain (CARDIA)    Difficulty of Paying Living Expenses: Not very hard  Food Insecurity: No Food Insecurity (02/24/2024)   Hunger Vital Sign    Worried About Running Out of Food in the Last Year: Never true    Ran Out of Food in the Last Year: Never true  Transportation Needs: No Transportation Needs (02/24/2024)   PRAPARE - Administrator, Civil Service (Medical): No    Lack of Transportation (Non-Medical): No  Physical Activity: Insufficiently Active (02/24/2024)   Exercise Vital Sign    Days of Exercise per Week: 3 days    Minutes of Exercise per Session: 30 min  Stress: No Stress Concern Present (02/24/2024)   [employer] of Occupational Health - Occupational Stress Questionnaire    Feeling of Stress: Not at all  Social Connections: Moderately Integrated (02/24/2024)   Social Connection and Isolation Panel    Frequency of Communication with Friends and Family: More than three times a week    Frequency of Social Gatherings with Friends and Family: More than three times a week    Attends Religious Services: More than 4 times per year    Active Member of [employer] or Organizations: Yes    Attends Banker Meetings: More than 4 times per year    Marital Status: Widowed    Tobacco Counseling Counseling given: Not Answered Tobacco comments: pack lasts 1 week, never more than 0.25 pack in a day    Clinical Intake:  Pre-visit preparation completed: Yes  Pain : No/denies pain     BMI - recorded: 22.62 Nutritional Status: BMI of 19-24  Normal Nutritional Risks: None Diabetes: No  Lab Results  Component Value  Date   HGBA1C 5.3 09/22/2023   HGBA1C 5.4 09/25/2022   HGBA1C 5.3 10/02/2021     How often do you need to have someone help you when you read instructions, pamphlets, or other written materials from your doctor or pharmacy?: 1 - Never  Interpreter Needed?: No  Information entered by :: Rayya Yagi whifield<CMA   Activities of Daily Living     02/24/2024    9:38 AM 04/02/2023   11:37 AM  In your present state of health, do you have any difficulty performing the following activities:  Hearing? 0 0  Vision? 0 0  Difficulty concentrating or making decisions? 0 0  Walking or climbing stairs? 0 0  Dressing or bathing? 0 0  Doing errands, shopping? 0 0  Preparing Food and eating ? N N  Using the Toilet? N N  In the past six months, have you accidently leaked urine? N N  Do you have problems with loss of bowel control? N N  Managing your Medications? N N  Managing your Finances? N N  Housekeeping or managing your Housekeeping? N N    Patient Care Team: Rothfuss, Jacob T, PA-C as PCP - General (Physician Assistant)  I have updated your Care Teams any recent Medical Services you may have received from other providers in the past year.     Assessment:   This is a routine wellness examination for [name].  Hearing/Vision screen Hearing Screening - Comments:: Wears hearing aids  Vision Screening - Comments:: Patient wears readers(over the counter  Patient see Dr. Regenia in [facility]   Goals Addressed             This Visit's Progress    Patient Stated       Patient would like to stay healthy        Depression Screen     02/24/2024    9:40 AM 09/22/2023    9:17 AM 04/02/2023   11:38 AM 03/31/2022   11:34 AM 03/28/2021   11:44 AM 02/23/2020    2:20 PM 09/08/2019    9:23 AM  PHQ 2/9  Scores  PHQ - 2 Score 0 0 0 0 0 1 4  PHQ- 9 Score 2 5     11     Fall Risk     02/24/2024    9:37 AM 09/22/2023    9:16 AM 04/02/2023   11:38 AM 03/31/2022   11:34 AM  03/28/2021   11:43 AM  Fall Risk   Falls in the past year? 1 1 0 1 1  Number falls in past yr: 1 1 0 0 0  Injury with Fall? 1 0 0 0 0  Risk for fall due to : History of fall(s);Impaired balance/gait History of fall(s) No Fall Risks No Fall Risks History of fall(s)  Follow up Falls evaluation completed Falls evaluation completed Falls evaluation completed;Falls prevention discussed Falls evaluation completed;Falls prevention discussed  Falls evaluation completed;Falls prevention discussed      Data saved with a previous flowsheet row definition    MEDICARE RISK AT HOME:  Medicare Risk at Home Any stairs in or around the home?: No If so, are there any without handrails?: No Home free of loose throw rugs in walkways, pet beds, electrical cords, etc?: Yes Adequate lighting in your home to reduce risk of falls?: Yes Life alert?: No Use of a cane, walker or w/c?: No Grab bars in the bathroom?: No Shower chair or bench in shower?: No Elevated toilet seat or a handicapped toilet?: No  TIMED UP AND GO:  Was the test performed?  No  Cognitive Function: 6CIT completed        02/24/2024    9:41 AM  6CIT Screen  What Year? 0 points  What month? 0 points  What time? 0 points  Count back from 20 0 points  Months in reverse 0 points  Repeat phrase 0 points  Total Score 0 points    Immunizations Immunization History  Administered Date(s) Administered   Fluad Trivalent(High Dose 65+) 03/15/2023   INFLUENZA, HIGH DOSE SEASONAL PF 02/11/2018, 02/23/2020, 02/15/2022   Influenza-Unspecified 02/17/2019, 02/21/2021   Moderna Covid-19 Fall Seasonal Vaccine 23yrs & older 02/11/2022   PFIZER Comirnaty(Gray Top)Covid-19 Tri-Sucrose Vaccine 03/15/2020   PFIZER(Purple Top)SARS-COV-2 Vaccination 07/12/2019, 08/02/2019   Pfizer Covid-19 Vaccine Bivalent Booster 7yrs & up 01/29/2021   Pneumococcal Conjugate-13 04/30/2015   Pneumococcal Polysaccharide-23 03/31/2018   Td 08/02/1996, 03/28/2021    Tdap 03/10/2011   Zoster Recombinant(Shingrix) 07/18/2022, 10/03/2022    Screening Tests Health Maintenance  Topic Date Due   Influenza Vaccine  12/18/2023   COVID-19 Vaccine (6 - 2025-26 season) 01/18/2024   Medicare Annual Wellness (AWV)  02/23/2025   Mammogram  05/26/2025   DTaP/Tdap/Td (4 - Td or Tdap) 03/29/2031   Colonoscopy  07/15/2032   Pneumococcal Vaccine: 50+ Years  Completed   DEXA SCAN  Completed   Zoster Vaccines- Shingrix  Completed   Meningococcal B Vaccine  Aged Out   Hepatitis C Screening  Discontinued    Health Maintenance Items Addressed:addressed with patient but declined  Additional Screening:  Vision Screening: Recommended annual ophthalmology exams for early detection of glaucoma and other disorders of the eye. Is the patient up to date with their annual eye exam?  Yes    Dental Screening: Recommended annual dental exams for proper oral hygiene  Community Resource Referral / Chronic Care Management: CRR required this visit?  No   CCM required this visit?  No   Plan:    I have personally reviewed and noted the following in the patient's chart:   Medical and social  history Use of alcohol, tobacco or illicit drugs  Current medications and supplements including opioid prescriptions. Patient is not currently taking opioid prescriptions. Functional ability and status Nutritional status Physical activity Advanced directives List of other physicians Hospitalizations, surgeries, and ER visits in previous 12 months Vitals Screenings to include cognitive, depression, and falls Referrals and appointments  In addition, I have reviewed and discussed with patient certain preventive protocols, quality metrics, and best practice recommendations. A written personalized care plan for preventive services as well as general preventive health recommendations were provided to patient.   Lyle MARLA Right, [place]   02/24/2024   After Visit Summary: (MyChart)  Due to this being a telephonic visit, the after visit summary with patients personalized plan was offered to patient via MyChart   Notes: Nothing significant to report at this time.

## 2024-02-25 ENCOUNTER — Other Ambulatory Visit (HOSPITAL_BASED_OUTPATIENT_CLINIC_OR_DEPARTMENT_OTHER): Payer: Self-pay

## 2024-02-25 MED ORDER — SODIUM FLUORIDE 5000 PPM 1.1 % DT PSTE
1.0000 | PASTE | Freq: Two times a day (BID) | DENTAL | 0 refills | Status: AC
  Filled 2024-02-25: qty 100, 30d supply, fill #0

## 2024-03-15 ENCOUNTER — Other Ambulatory Visit (HOSPITAL_BASED_OUTPATIENT_CLINIC_OR_DEPARTMENT_OTHER): Payer: Self-pay

## 2024-03-17 ENCOUNTER — Other Ambulatory Visit (HOSPITAL_BASED_OUTPATIENT_CLINIC_OR_DEPARTMENT_OTHER): Payer: Self-pay

## 2024-03-17 MED ORDER — FLUZONE HIGH-DOSE 0.5 ML IM SUSY
0.5000 mL | PREFILLED_SYRINGE | Freq: Once | INTRAMUSCULAR | 0 refills | Status: AC
  Filled 2024-03-17: qty 0.5, 1d supply, fill #0

## 2024-03-30 ENCOUNTER — Encounter (INDEPENDENT_AMBULATORY_CARE_PROVIDER_SITE_OTHER): Payer: Self-pay | Admitting: Nurse Practitioner

## 2024-04-01 ENCOUNTER — Ambulatory Visit: Payer: PPO | Admitting: Nurse Practitioner

## 2024-04-16 ENCOUNTER — Other Ambulatory Visit (HOSPITAL_BASED_OUTPATIENT_CLINIC_OR_DEPARTMENT_OTHER): Payer: Self-pay | Admitting: Student

## 2024-04-16 DIAGNOSIS — M791 Myalgia, unspecified site: Secondary | ICD-10-CM

## 2024-04-16 DIAGNOSIS — E7800 Pure hypercholesterolemia, unspecified: Secondary | ICD-10-CM

## 2024-04-18 ENCOUNTER — Encounter (HOSPITAL_BASED_OUTPATIENT_CLINIC_OR_DEPARTMENT_OTHER): Payer: Self-pay | Admitting: Student

## 2024-04-18 ENCOUNTER — Other Ambulatory Visit (HOSPITAL_BASED_OUTPATIENT_CLINIC_OR_DEPARTMENT_OTHER): Payer: Self-pay

## 2024-04-18 ENCOUNTER — Ambulatory Visit (INDEPENDENT_AMBULATORY_CARE_PROVIDER_SITE_OTHER): Admitting: Student

## 2024-04-18 VITALS — BP 124/79 | HR 68 | Temp 98.2°F | Resp 16 | Ht 59.0 in | Wt 116.5 lb

## 2024-04-18 DIAGNOSIS — E782 Mixed hyperlipidemia: Secondary | ICD-10-CM

## 2024-04-18 DIAGNOSIS — Z0000 Encounter for general adult medical examination without abnormal findings: Secondary | ICD-10-CM

## 2024-04-18 DIAGNOSIS — F5101 Primary insomnia: Secondary | ICD-10-CM | POA: Diagnosis not present

## 2024-04-18 LAB — CBC WITH DIFFERENTIAL/PLATELET
Basophils Absolute: 0.1 x10E3/uL (ref 0.0–0.2)
Basos: 2 %
EOS (ABSOLUTE): 0.1 x10E3/uL (ref 0.0–0.4)
Eos: 1 %
Hematocrit: 40.6 % (ref 34.0–46.6)
Hemoglobin: 12.7 g/dL (ref 11.1–15.9)
Immature Grans (Abs): 0 x10E3/uL (ref 0.0–0.1)
Immature Granulocytes: 0 %
Lymphocytes Absolute: 1.2 x10E3/uL (ref 0.7–3.1)
Lymphs: 25 %
MCH: 29.1 pg (ref 26.6–33.0)
MCHC: 31.3 g/dL — ABNORMAL LOW (ref 31.5–35.7)
MCV: 93 fL (ref 79–97)
Monocytes Absolute: 0.4 x10E3/uL (ref 0.1–0.9)
Monocytes: 9 %
Neutrophils Absolute: 3 x10E3/uL (ref 1.4–7.0)
Neutrophils: 63 %
Platelets: 235 x10E3/uL (ref 150–450)
RBC: 4.37 x10E6/uL (ref 3.77–5.28)
RDW: 11.9 % (ref 11.7–15.4)
WBC: 4.8 x10E3/uL (ref 3.4–10.8)

## 2024-04-18 LAB — COMPREHENSIVE METABOLIC PANEL WITH GFR
ALT: 12 IU/L (ref 0–32)
AST: 18 IU/L (ref 0–40)
Albumin: 4.2 g/dL (ref 3.8–4.8)
Alkaline Phosphatase: 99 IU/L (ref 49–135)
BUN/Creatinine Ratio: 16 (ref 12–28)
BUN: 11 mg/dL (ref 8–27)
Bilirubin Total: 0.3 mg/dL (ref 0.0–1.2)
CO2: 23 mmol/L (ref 20–29)
Calcium: 8.9 mg/dL (ref 8.7–10.3)
Chloride: 101 mmol/L (ref 96–106)
Creatinine, Ser: 0.67 mg/dL (ref 0.57–1.00)
Globulin, Total: 2 g/dL (ref 1.5–4.5)
Glucose: 78 mg/dL (ref 70–99)
Potassium: 4.2 mmol/L (ref 3.5–5.2)
Sodium: 139 mmol/L (ref 134–144)
Total Protein: 6.2 g/dL (ref 6.0–8.5)
eGFR: 93 mL/min/1.73 (ref >59)

## 2024-04-18 LAB — LIPID PANEL
Chol/HDL Ratio: 3.1 ratio (ref 0.0–4.4)
Cholesterol, Total: 248 mg/dL — ABNORMAL HIGH (ref 100–199)
HDL: 79 mg/dL (ref >39)
LDL Chol Calc (NIH): 146 mg/dL — ABNORMAL HIGH (ref 0–99)
Triglycerides: 130 mg/dL (ref 0–149)
VLDL Cholesterol Cal: 23 mg/dL (ref 5–40)

## 2024-04-18 MED ORDER — EZETIMIBE 10 MG PO TABS
10.0000 mg | ORAL_TABLET | Freq: Every day | ORAL | 3 refills | Status: AC
  Filled 2024-04-18: qty 90, 90d supply, fill #0

## 2024-04-18 MED ORDER — AREXVY 120 MCG/0.5ML IM SUSR
0.5000 mL | INTRAMUSCULAR | 0 refills | Status: AC
  Filled 2024-04-18: qty 1, 1d supply, fill #0

## 2024-04-18 NOTE — Progress Notes (Signed)
 Complete physical exam  Patient: [name]   DOB: 04/21/1951   73 y.o. Female  MRN: [id number]  Subjective:    Chief Complaint  Patient presents with   Annual Exam    Annual exam.    Discussed the use of AI scribe software for clinical note transcription with the patient, who gave verbal consent to proceed.  History of Present Illness   [name] is a 73 year old female who presents for an annual physical exam.  She has experienced weight gain over the past two years, which she attributes to quitting smoking nearly two years ago. She is dissatisfied with the weight gain.  She has not engaged in regular exercise since last fall. She previously attended exercise classes until January but stopped after falling off a ball during a yoga class. She has not resumed regular physical activity and does not currently have a treadmill or gym membership, although she has access to a gym through the [employer] program.  She experiences significant difficulty sleeping despite taking two trazodone  tablets nightly. Her sleep duration varies, with some nights only achieving one hour of sleep and other nights four to five hours. She has tried melatonin and Tylenol  PM in the past, but they were ineffective or caused grogginess. She does not use her phone before bed but watches TV, which she acknowledges may impact her sleep quality.  She has a history of cataract surgery five years ago and recently saw an eye doctor due to a film developing over her eyes, which was deemed normal post-surgery. She also has dentures and had dental work done a few months ago.  She is currently on Zetia , which she takes daily without experiencing the muscle issues she had with statins.     Most recent fall risk assessment:    04/18/2024    9:29 AM  Fall Risk   Falls in the past year? 1  Number falls in past yr: 1  Comment Left leg was numb from sciatic nerve. Did Rehab.  Injury with Fall? 0  Risk  for fall due to : History of fall(s);Impaired balance/gait;Impaired mobility  Follow up Education provided;Falls prevention discussed;Falls evaluation completed     Most recent depression screenings:    04/18/2024    9:30 AM 02/24/2024    9:40 AM  PHQ 2/9 Scores  PHQ - 2 Score 0 0  PHQ- 9 Score 6 2      Data saved with a previous flowsheet row definition   Patient Active Problem List   Diagnosis Date Noted   Myalgia due to statin 09/22/2023   Age-related osteoporosis without current pathological fracture 09/22/2023   Gastroesophageal reflux disease 09/22/2023   Canker sore 09/22/2023   Low back pain 06/23/2023   Lumbar radiculopathy 06/23/2023   Muscle weakness (generalized) 06/23/2023   Degeneration of lumbar intervertebral disc 06/15/2023   Neuralgia of left sciatic nerve 06/15/2023   Pain of left hip joint 06/15/2023   Abnormal glucose 04/02/2023   Hypothyroidism 04/02/2023   Elevated BP without diagnosis of hypertension 04/02/2023   Former smoker 04/02/2023   B12 deficiency 02/22/2020   Smoker 02/22/2020   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   Insomnia secondary to situational depression 09/03/2018   Vitamin D  deficiency 09/03/2018   Atherosclerosis of aorta 03/31/2018   Personal history of benign carcinoid tumor 03/19/2017   External hemorrhoids with complication 03/10/2011   RENAL CALCULUS 08/16/2007   Renal calculus 08/16/2007   Hyperlipidemia, mixed 08/09/2007   Past  Medical History:  Diagnosis Date   Arthritis    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 12/10/2016   Hemorrhoids    History of diverticulitis of colon    Hyperlipidemia    Kidney stones    Social History   Tobacco Use   Smoking status: Former    Current packs/day: 0.00    Average packs/day: 0.1 packs/day for 45.0 years (5.4 ttl pk-yrs)    Types: Cigarettes    Start date: 33    Quit date: 2024    Years since quitting: 1.9    Passive exposure: Past   Smokeless tobacco: Never    Tobacco comments:    pack lasts 1 week, never more than 0.25 pack in a day  Vaping Use   Vaping status: Never Used  Substance Use Topics   Alcohol use: No    Alcohol/week: 0.0 standard drinks of alcohol   Drug use: No   No Known Allergies    Patient Care Team: Roniel Halloran T, PA-C as PCP - General (Physician Assistant)   Outpatient Medications Prior to Visit  Medication Sig   Acetaminophen  (TYLENOL  PO) Take by mouth. (Patient taking differently: Take by mouth as needed.)   Cholecalciferol (VITAMIN D -3 PO) Take 50 mcg by mouth daily.   docusate sodium  (COLACE) 100 MG capsule Take 100 mg by mouth as needed.   ezetimibe  (ZETIA ) 10 MG tablet Take 1 tablet (10 mg total) by mouth daily.   hydrocortisone  (ANUSOL -HC) 25 MG suppository Place 1 suppository (25 mg total) rectally 3 (three) times daily. Do not use for more than 7 days.   hydrocortisone  (PROCTOZONE -HC) 2.5 % rectal cream Apply 1 Application topically 3 (three) times daily. Do not use for more than 7 days in a row.   lidocaine  (XYLOCAINE ) 2 % solution Use as directed 15 mLs in the mouth or throat as needed for mouth pain.   omeprazole  (PRILOSEC) 20 MG capsule Take 1 capsule (20 mg total) by mouth daily.   Sodium Fluoride  (SODIUM FLUORIDE  5000 PPM) 1.1 % PSTE Apply to teeth in the morning and at bedtime. Avoid eating and drinking for 30 minutes after use.   traZODone  (DESYREL ) 50 MG tablet Take 1-2 tablets (50-100 mg total) by mouth nightly for sleep (Patient taking differently: Take 100 mg by mouth at bedtime.)   vitamin B-12 (CYANOCOBALAMIN ) 50 MCG tablet Take 500 mcg by mouth daily.   zinc gluconate 50 MG tablet Take 50 mg by mouth daily.   No facility-administered medications prior to visit.    ROS  Per HPI     Objective:     BP 124/79   Pulse 68   Temp 98.2 F (36.8 C) (Oral)   Resp 16   Ht 4' 11 (1.499 m)   Wt 116 lb 8 oz (52.8 kg)   SpO2 96%   BMI 23.53 kg/m  BP Readings from Last 3 Encounters:   04/18/24 124/79  02/24/24 112/68  10/28/23 (!) 112/48   Wt Readings from Last 3 Encounters:  04/18/24 116 lb 8 oz (52.8 kg)  02/24/24 112 lb (50.8 kg)  10/28/23 111 lb 6.4 oz (50.5 kg)   SpO2 Readings from Last 3 Encounters:  04/18/24 96%  10/28/23 96%  09/22/23 96%      Physical Exam Constitutional:      General: She is not in acute distress.    Appearance: Normal appearance. She is not ill-appearing or diaphoretic.  HENT:     Head: Normocephalic and atraumatic.  Right Ear: Tympanic membrane, ear canal and external ear normal.     Left Ear: Tympanic membrane, ear canal and external ear normal.     Nose: Nose normal.     Mouth/Throat:     Mouth: Mucous membranes are moist.     Pharynx: Oropharynx is clear.  Eyes:     General: No scleral icterus.       Right eye: No discharge.        Left eye: No discharge.     Extraocular Movements: Extraocular movements intact.     Conjunctiva/sclera: Conjunctivae normal.     Pupils: Pupils are equal, round, and reactive to light.  Neck:     Thyroid : No thyroid  mass, thyromegaly or thyroid  tenderness.     Vascular: No carotid bruit.  Cardiovascular:     Rate and Rhythm: Normal rate and regular rhythm.     Pulses: Normal pulses.     Heart sounds: Normal heart sounds. No murmur heard.    No friction rub. No gallop.  Pulmonary:     Effort: Pulmonary effort is normal.     Breath sounds: Normal breath sounds. No wheezing, rhonchi or rales.  Chest:     Chest wall: No tenderness.  Abdominal:     General: Bowel sounds are normal. There is no distension.     Palpations: Abdomen is soft.     Tenderness: There is no abdominal tenderness. There is no guarding.  Musculoskeletal:        General: No swelling, deformity or signs of injury.     Cervical back: Neck supple.     Right lower leg: No edema.     Left lower leg: No edema.  Lymphadenopathy:     Cervical: No cervical adenopathy.     Right cervical: No superficial or posterior  cervical adenopathy.    Left cervical: No superficial cervical adenopathy.  Skin:    Coloration: Skin is not jaundiced.     Findings: No rash.  Neurological:     General: No focal deficit present.     Mental Status: She is alert and oriented to person, place, and time.     Motor: No weakness.  Psychiatric:        Behavior: Behavior normal.      No results found for any visits on 04/18/24. Last CBC Lab Results  Component Value Date   WBC 5.1 09/22/2023   HGB 13.5 09/22/2023   HCT 42.5 09/22/2023   MCV 91 09/22/2023   MCH 29.0 09/22/2023   RDW 12.1 09/22/2023   PLT 278 09/22/2023   Last metabolic panel Lab Results  Component Value Date   GLUCOSE 88 09/22/2023   NA 140 09/22/2023   K 4.6 09/22/2023   CL 103 09/22/2023   CO2 22 09/22/2023   BUN 12 09/22/2023   CREATININE 0.70 09/22/2023   EGFR 93 09/22/2023   CALCIUM  9.6 09/22/2023   PROT 6.7 09/22/2023   ALBUMIN 4.6 09/22/2023   LABGLOB 2.1 09/22/2023   BILITOT 0.2 09/22/2023   ALKPHOS 111 09/22/2023   AST 15 09/22/2023   ALT 10 09/22/2023   ANIONGAP 9 12/13/2016   Last lipids Lab Results  Component Value Date   CHOL 287 (H) 09/22/2023   HDL 82 09/22/2023   LDLCALC 181 (H) 09/22/2023   LDLDIRECT 166.3 03/05/2011   TRIG 135 09/22/2023   CHOLHDL 3.5 09/22/2023   Last hemoglobin A1c Lab Results  Component Value Date   HGBA1C 5.3 09/22/2023  Assessment & Plan:    Routine Health Maintenance and Physical Exam  Health Maintenance  Topic Date Due   COVID-19 Vaccine (6 - 2025-26 season) 05/04/2024*   Medicare Annual Wellness Visit  02/23/2025   Breast Cancer Screening  05/26/2025   DTaP/Tdap/Td vaccine (4 - Td or Tdap) 03/29/2031   Colon Cancer Screening  07/15/2032   Pneumococcal Vaccine for age over 92  Completed   Flu Shot  Completed   Osteoporosis screening with Bone Density Scan  Completed   Zoster (Shingles) Vaccine  Completed   Meningitis B Vaccine  Aged Out   Hepatitis C Screening   Discontinued  *Topic was postponed. The date shown is not the original due date.   Assessment and Plan    Adult Wellness Visit Routine adult wellness visit with no significant concerns. Blood pressure is well-controlled. Recent eye and dental evaluations were normal. Colonoscopy and tetanus vaccinations are up to date. - Encouraged regular exercise, such as using a treadmill or elliptical, or participating in water aerobics. - Discussed RSV vaccine for respiratory protection, especially given age-related risk. - Will schedule follow-up in six months for annual physical.  Mixed hyperlipidemia Managed with zetia - hx of statin myopathy. No adverse effects reported, unlike previous statin use. - Continue current cholesterol medication. - Will recheck labs to assess cholesterol levels.  Insomnia Chronic insomnia with difficulty maintaining sleep. Currently taking trazodone  2 tablets nightly with variable effectiveness. Melatonin and Tylenol  PM were ineffective. Discussed non-pharmacological interventions to improve sleep quality. - Continue trazodone  2 tablets nightly. - Implement sleep hygiene measures: avoid TV an hour before bed, ensure room is dark, and avoid eating close to bedtime. - Encouraged regular exercise to improve sleep quality.      Return in about 6 months (around 10/17/2024) for 6 Mo Follow Up.     Keishawna Carranza T Brahim Dolman, PA-C

## 2024-04-18 NOTE — Patient Instructions (Signed)
 It was nice to see you today!  Things to do to keep yourself healthy! - Exercise at least 30-45 minutes a day, 3-4 days a week.  - Eat a low-fat diet with lots of fruits and vegetables, up to 7-9 servings per day.  - Seatbelts can save your life. Wear them always.  - Smoke detectors on every level of your home, check batteries every year.  - Eye Doctor: have an eye exam every 1-2 years, even if you do not wear glasses or contacts. - Safe sex: if you may be exposed to STDs, use a condom.  - Alcohol: If you drink, do it moderately, less than 2 drinks per day.  - Health Care Power of Attorney: choose someone to speak for you if you are not able.  - Depression and anxiety are common in our stressful world.If you're feeling stressed, down, or like you're losing interest in things you normally enjoy, please come in for a visit.  - Violence: If anyone is threatening or hurting you, please call immediately.  Everyone deserves to be safe and loved in all of the relationships.   If you have any problems before your next visit feel free to message me via MyChart (minor issues or questions) or call the office, otherwise you may reach out to schedule an office visit.  Thank you! Gwendalyn Mcgonagle, PA-C

## 2024-04-19 ENCOUNTER — Ambulatory Visit (HOSPITAL_BASED_OUTPATIENT_CLINIC_OR_DEPARTMENT_OTHER): Payer: Self-pay | Admitting: Student

## 2024-05-05 ENCOUNTER — Other Ambulatory Visit (HOSPITAL_COMMUNITY): Payer: Self-pay

## 2024-10-17 ENCOUNTER — Ambulatory Visit (HOSPITAL_BASED_OUTPATIENT_CLINIC_OR_DEPARTMENT_OTHER): Admitting: Student
